# Patient Record
Sex: Male | Born: 1970 | Race: White | Hispanic: No | Marital: Married | Smoking: Current every day smoker
Health system: Southern US, Community
[De-identification: ages and names within clinical notes are randomized; demographics above are authoritative.]

## PROBLEM LIST (undated history)

## (undated) ENCOUNTER — Emergency Department (HOSPITAL_COMMUNITY): Disposition: A | Discharge status: Still In Hospital | Payer: Managed Care, Other (non HMO)

## (undated) DIAGNOSIS — R112 Nausea with vomiting, unspecified: Secondary | ICD-10-CM

## (undated) DIAGNOSIS — M519 Unspecified thoracic, thoracolumbar and lumbosacral intervertebral disc disorder: Secondary | ICD-10-CM

## (undated) DIAGNOSIS — K219 Gastro-esophageal reflux disease without esophagitis: Secondary | ICD-10-CM

## (undated) DIAGNOSIS — Z87442 Personal history of urinary calculi: Secondary | ICD-10-CM

## (undated) DIAGNOSIS — E119 Type 2 diabetes mellitus without complications: Secondary | ICD-10-CM

## (undated) DIAGNOSIS — E785 Hyperlipidemia, unspecified: Secondary | ICD-10-CM

## (undated) DIAGNOSIS — I10 Essential (primary) hypertension: Secondary | ICD-10-CM

## (undated) DIAGNOSIS — Z9889 Other specified postprocedural states: Secondary | ICD-10-CM

## (undated) DIAGNOSIS — G4730 Sleep apnea, unspecified: Secondary | ICD-10-CM

## (undated) HISTORY — PX: WISDOM TOOTH EXTRACTION: SHX21

## (undated) HISTORY — PX: ANKLE RECONSTRUCTION: SHX1151

## (undated) HISTORY — DX: Type 2 diabetes mellitus without complications: E11.9

## (undated) HISTORY — DX: Essential (primary) hypertension: I10

## (undated) HISTORY — PX: LUMBAR DISC SURGERY: SHX700

## (undated) HISTORY — DX: Hyperlipidemia, unspecified: E78.5

## (undated) HISTORY — DX: Unspecified thoracic, thoracolumbar and lumbosacral intervertebral disc disorder: M51.9

---

## 1998-10-07 HISTORY — PX: APPENDECTOMY: SHX54

## 1999-02-05 HISTORY — PX: COLON SURGERY: SHX602

## 2001-01-15 ENCOUNTER — Encounter: Payer: Self-pay | Admitting: Family Medicine

## 2001-01-15 ENCOUNTER — Inpatient Hospital Stay (HOSPITAL_COMMUNITY): Admission: RE | Admit: 2001-01-15 | Discharge: 2001-01-18 | Payer: Self-pay | Admitting: Family Medicine

## 2001-02-06 ENCOUNTER — Ambulatory Visit (HOSPITAL_COMMUNITY): Admission: RE | Admit: 2001-02-06 | Discharge: 2001-02-06 | Payer: Self-pay | Admitting: General Surgery

## 2002-06-07 ENCOUNTER — Emergency Department (HOSPITAL_COMMUNITY): Admission: EM | Admit: 2002-06-07 | Discharge: 2002-06-07 | Payer: Self-pay | Admitting: Emergency Medicine

## 2002-08-09 ENCOUNTER — Ambulatory Visit: Admission: RE | Admit: 2002-08-09 | Discharge: 2002-08-09 | Payer: Self-pay | Admitting: Family Medicine

## 2002-09-18 ENCOUNTER — Emergency Department (HOSPITAL_COMMUNITY): Admission: EM | Admit: 2002-09-18 | Discharge: 2002-09-18 | Payer: Self-pay | Admitting: Emergency Medicine

## 2002-12-09 ENCOUNTER — Encounter: Payer: Self-pay | Admitting: *Deleted

## 2002-12-09 ENCOUNTER — Emergency Department (HOSPITAL_COMMUNITY): Admission: EM | Admit: 2002-12-09 | Discharge: 2002-12-09 | Payer: Self-pay | Admitting: *Deleted

## 2003-11-29 ENCOUNTER — Emergency Department (HOSPITAL_COMMUNITY): Admission: EM | Admit: 2003-11-29 | Discharge: 2003-11-29 | Payer: Self-pay | Admitting: Emergency Medicine

## 2004-08-23 ENCOUNTER — Ambulatory Visit (HOSPITAL_BASED_OUTPATIENT_CLINIC_OR_DEPARTMENT_OTHER): Admission: RE | Admit: 2004-08-23 | Discharge: 2004-08-23 | Payer: Self-pay | Admitting: Orthopedic Surgery

## 2004-10-07 HISTORY — PX: ANKLE ARTHROSCOPY: SHX545

## 2006-01-30 ENCOUNTER — Emergency Department (HOSPITAL_COMMUNITY): Admission: EM | Admit: 2006-01-30 | Discharge: 2006-01-30 | Payer: Self-pay | Admitting: Emergency Medicine

## 2008-11-14 ENCOUNTER — Emergency Department (HOSPITAL_COMMUNITY): Admission: EM | Admit: 2008-11-14 | Discharge: 2008-11-14 | Payer: Self-pay | Admitting: Emergency Medicine

## 2009-10-07 HISTORY — PX: WISDOM TOOTH EXTRACTION: SHX21

## 2011-02-22 NOTE — Op Note (Signed)
NAME:  Mark Phillips, Mark Phillips               ACCOUNT NO.:  [id number]   MEDICAL RECORD NO.:  [id number]          PATIENT TYPE:  AMB   LOCATION:  DSC                          FACILITY:  [facility]   PHYSICIAN:  Loreta Ave, M.D. DATE OF BIRTH:  Sep 28, 1971   DATE OF PROCEDURE:  08/23/2004  DATE OF DISCHARGE:                                 OPERATIVE REPORT   PREOPERATIVE DIAGNOSES:  1.  Recurrent ankle sprains.  2.  Question lateral instability, right ankle.   POSTOPERATIVE DIAGNOSES:  1.  Recurrent mild ankle sprains without significant lateral or anterior      instability, right ankle.  2.  Evident high ankle sprain with partial tearing, syndesmosis.  3.  Reactive inner synovitis and superficial chondral injury, medial talar      dome.   PROCEDURE:  Right ankle examination under anesthesia, including fluoroscopic  partial synovectomy and debridement with scarification of syndesmosis  injury.   SURGEON:  Loreta Ave, M.D.   ASSISTANT:  Genene Churn. Denton Meek.   ANESTHESIA:  General.   ESTIMATED BLOOD LOSS:  Minimal.   TOURNIQUET TIME:  45 minutes.   SPECIMENS:  None.   CULTURES:  None.   COMPLICATIONS:  None.   DRESSING:  Soft compressive with Cam walker.   PROCEDURE:  Patient brought to the operating room and after adequate  anesthesia had been obtained, both ankles examined and fluoroscopic stress  views obtained on the right to talar tilt or anterior drawer stress views.  Talar tilt revealed that he only opened about 8 degrees.  The opposite left  ankle opening at the syndesmosis.  All bony structures intact.  Tourniquet  applied with a stirrup.  Prepped and draped in the usual sterile fashion.  Exsanguinated with elevation and an Esmarch, tourniquet inflated to 250  mmHg.  Anterolateral and anteromedial arthroscopic portals.  The ankle  entered and inspected.  Evident syndesmotic injury with tearing of the  anterior two-thirds syndesmosis extending up 5-6 mm.  All the  debris,  inflammatory tissue cleared.  That area was stressed with rotation and did  not open further.  The very back of the syndesmosis and a posterior distal  tib-fib ligament was still intact.  All inflammatory debris removed.  I was  comfortable enough with stress views that this did not require fixation at  the syndesmosis. Talar dome had a little bit of superficial injury,  posteromedial tear and tip of the medial malleolus treated with  chondroplasty.  Entire ankle examined but no other findings appreciated.  Anteriorly with full dorsiflexion.  Instruments and fluid removed.  Portals  and ankle injected with Marcaine.  Portals closed with 4-0 nylon.  Sterile  compressive dressing applied.  Cam walker applied.  Tourniquet deflated and  removed.  Anesthesia reversed.  Brought to the recovery room.  Tolerated the  surgery well with no complications.      Valentino Saxon   DFM/MEDQ  D:  08/23/2004  T:  08/24/2004  Job:  [id number]

## 2011-06-05 ENCOUNTER — Emergency Department (HOSPITAL_COMMUNITY)
Admission: EM | Admit: 2011-06-05 | Discharge: 2011-06-05 | Disposition: A | Discharge status: Home / Self Care | Payer: Managed Care, Other (non HMO) | Attending: Emergency Medicine | Admitting: Emergency Medicine

## 2011-06-05 ENCOUNTER — Emergency Department (HOSPITAL_COMMUNITY): Payer: Managed Care, Other (non HMO)

## 2011-06-05 DIAGNOSIS — F17200 Nicotine dependence, unspecified, uncomplicated: Secondary | ICD-10-CM | POA: Insufficient documentation

## 2011-06-05 DIAGNOSIS — IMO0002 Reserved for concepts with insufficient information to code with codable children: Secondary | ICD-10-CM | POA: Insufficient documentation

## 2011-06-05 DIAGNOSIS — R110 Nausea: Secondary | ICD-10-CM | POA: Insufficient documentation

## 2011-06-05 DIAGNOSIS — X58XXXA Exposure to other specified factors, initial encounter: Secondary | ICD-10-CM | POA: Insufficient documentation

## 2011-06-05 MED ORDER — METHOCARBAMOL 500 MG PO TABS: ORAL_TABLET | ORAL | Status: DC

## 2011-06-05 MED ORDER — DIAZEPAM 5 MG PO TABS
5.0000 mg | ORAL_TABLET | Freq: Once | ORAL | Status: AC
  Administered 2011-06-05: 5 mg via ORAL
  Filled 2011-06-05: qty 1

## 2011-06-05 MED ORDER — ONDANSETRON HCL 4 MG PO TABS
4.0000 mg | ORAL_TABLET | Freq: Once | ORAL | Status: AC
  Administered 2011-06-05: 4 mg via ORAL
  Filled 2011-06-05: qty 1

## 2011-06-05 MED ORDER — HYDROCODONE-ACETAMINOPHEN 5-325 MG PO TABS
2.0000 | ORAL_TABLET | Freq: Once | ORAL | Status: AC
  Administered 2011-06-05: 2 via ORAL
  Filled 2011-06-05: qty 2

## 2011-06-05 MED ORDER — HYDROCODONE-ACETAMINOPHEN 7.5-325 MG PO TABS: 1.0000 | ORAL_TABLET | ORAL | Status: AC | PRN

## 2011-06-05 NOTE — ED Provider Notes (Signed)
Medical screening examination/treatment/procedure(s) were performed by non-physician practitioner and as supervising physician I was immediately available for consultation/collaboration.  Shelda Jakes, MD 06/05/11 2007

## 2011-06-05 NOTE — ED Notes (Addendum)
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

## 2011-06-05 NOTE — ED Notes (Signed)
Pt c/o rt shoulder pain that started yesterday. Movement aggravates pain. Limited ROM d/t pain. Denies any injury.

## 2011-06-05 NOTE — ED Provider Notes (Signed)
History     CSN: [id number] Arrival date & time: 06/05/2011  6:01 PM  Chief Complaint  Patient presents with  . Shoulder Pain   Patient is a 40 y.o. male presenting with shoulder pain. The history is provided by the patient.  Shoulder Pain This is a new problem. The current episode started yesterday. The problem occurs constantly. The problem has been gradually worsening. Associated symptoms include arthralgias and nausea. Pertinent negatives include no abdominal pain, chest pain, coughing or neck pain. Exacerbated by: movement. He has tried NSAIDs for the symptoms. The treatment provided no relief.    History reviewed. No pertinent past medical history.  Past Surgical History  Procedure Date  . Colon surgery   . Ankle reconstruction     lt reconstruction and rt scope sx    No family history on file.  History  Substance Use Topics  . Smoking status: Current Everyday Smoker  . Smokeless tobacco: Not on file  . Alcohol Use: No      Review of Systems  Constitutional: Negative for activity change.       All ROS Neg except as noted in HPI  HENT: Negative for nosebleeds and neck pain.   Eyes: Negative for photophobia and discharge.  Respiratory: Negative for cough, shortness of breath and wheezing.   Cardiovascular: Negative for chest pain and palpitations.  Gastrointestinal: Positive for nausea. Negative for abdominal pain and blood in stool.  Genitourinary: Negative for dysuria, frequency and hematuria.  Musculoskeletal: Positive for arthralgias. Negative for back pain.  Skin: Negative.   Neurological: Negative for dizziness, seizures and speech difficulty.  Psychiatric/Behavioral: Negative for hallucinations and confusion.    Physical Exam  BP 149/90  Pulse 73  Temp(Src) 98.3 F (36.8 C) (Oral)  Resp 16  SpO2 96%  Physical Exam  Nursing note and vitals reviewed. Constitutional: He is oriented to person, place, and time. He appears well-developed and  well-nourished.  Non-toxic appearance.  HENT:  Head: Normocephalic.  Right Ear: Tympanic membrane and external ear normal.  Left Ear: Tympanic membrane and external ear normal.  Eyes: EOM and lids are normal. Pupils are equal, round, and reactive to light.  Neck: Normal range of motion. Neck supple. Carotid bruit is not present.  Cardiovascular: Normal rate, regular rhythm, normal heart sounds, intact distal pulses and normal pulses.   Pulmonary/Chest: Breath sounds normal. No respiratory distress.  Abdominal: Soft. Bowel sounds are normal. There is no tenderness. There is no guarding.  Musculoskeletal: He exhibits tenderness.       Pain of the posterior left shoulder with ROM. Minimal pain to palpation. No dislocation. Sensory wnl. Distal pulses and cap refill on the left wnl.  Lymphadenopathy:       Head (right side): No submandibular adenopathy present.       Head (left side): No submandibular adenopathy present.    He has no cervical adenopathy.  Neurological: He is alert and oriented to person, place, and time. He has normal strength. No cranial nerve deficit or sensory deficit.  Skin: Skin is warm and dry.  Psychiatric: He has a normal mood and affect. His speech is normal.    ED Course  Procedures  MDM I have reviewed nursing notes, vital signs, and all appropriate lab and imaging results for this patient.      Kathie Dike, [place] 06/05/11 1956

## 2011-06-12 ENCOUNTER — Encounter: Payer: Self-pay | Admitting: Orthopedic Surgery

## 2011-06-12 ENCOUNTER — Ambulatory Visit (INDEPENDENT_AMBULATORY_CARE_PROVIDER_SITE_OTHER): Payer: Managed Care, Other (non HMO) | Admitting: Orthopedic Surgery

## 2011-06-12 VITALS — Resp 16 | Ht 72.0 in | Wt 195.0 lb

## 2011-06-12 DIAGNOSIS — M5010 Cervical disc disorder with radiculopathy, unspecified cervical region: Secondary | ICD-10-CM | POA: Insufficient documentation

## 2011-06-12 DIAGNOSIS — M5090 Cervical disc disorder, unspecified, unspecified cervical region: Secondary | ICD-10-CM

## 2011-06-12 MED ORDER — PREDNISONE 10 MG PO KIT: 10.0000 mg | PACK | ORAL | Status: DC

## 2011-06-12 MED ORDER — HYDROCODONE-ACETAMINOPHEN 7.5-325 MG PO TABS: 1.0000 | ORAL_TABLET | ORAL | Status: AC | PRN

## 2011-06-12 MED ORDER — GABAPENTIN 100 MG PO CAPS: 100.0000 mg | ORAL_CAPSULE | Freq: Three times a day (TID) | ORAL | Status: DC

## 2011-06-12 MED ORDER — METHOCARBAMOL 500 MG PO TABS: 500.0000 mg | ORAL_TABLET | Freq: Three times a day (TID) | ORAL | Status: AC

## 2011-06-12 NOTE — Progress Notes (Signed)
Chief complaint: Pain in the cervical spine and radiating down the RIGHT arm HPI:(9) 40 year old male presented with pain in his cervical spine radiating down his RIGHT arm into his RIGHT hand which started last Tuesday.  Onset was sudden.  Pain started in the neck and shoulder blade request in a shoulder upper arm elbow and hand  The pain is described as stabbing and burning, constant with the intensity of 8/10.  He reports numbness and tingling as well.  Previous treatment hydrocodone and Robaxin  X-ray was taken at the hospital it shows a incomplete segmentation of C2 and 3 no acute fracture or listhesis.  ROS:(2) Snoring, nausea, numbness and tingling.  PFSH: (1) History reviewed. No pertinent past medical history.   Physical Exam(12) GENERAL: normal development   CDV: pulses are normal   Skin: normal  Lymph: nodes were not palpable/normal  Psychiatric: awake, alert and oriented  Neuro: normal sensation  MSK Cervical spine he does have pain with flexion of the cervical spine and he has tenderness at the base of the cervical spine there is also tenderness over the medialBorder of the scapula.  He has painful cervical range of motion and decreased range of motion  The RIGHT upper extremity is without tenderness along the shoulder elbow wrist or hand with normal range of motion strength and stability.   1 Normal reflexes both arms to plus elbow and wrist.  Imaging: Cervical spine x-ray as stated  Assessment: Most likely acute cervical disc with nerve root irritation    Plan: Recommend the following medications Norco 7.5 mg q.4 p.r.n. Pain, her Robaxin 500 mg q.8.  Neurontin 100 mg t.i.d.  Probe a Dosepak double strength.  Come back 2 weeks.

## 2011-06-12 NOTE — Patient Instructions (Signed)
Use heating pad as needed on the cervical spine for symptomatic relief  Take medications as ordered  Return 2 weeks  No Work 2 weeks  Start physical therapy

## 2011-06-18 ENCOUNTER — Telehealth: Payer: Self-pay | Admitting: Orthopedic Surgery

## 2011-06-18 NOTE — Telephone Encounter (Signed)
Called back to patient.

## 2011-06-18 NOTE — Telephone Encounter (Signed)
This is fine 

## 2011-06-18 NOTE — Telephone Encounter (Signed)
Patient states he has scheduled initial physical therapy appointment at [facility] 06/24/11, and was told that was first available appointment. His fol/up appointment here is 06/27/11.  He's wanting to make sure this is okay, or if he should check e [facility] for an earlier date (he lives in [place]).  His ph# is [phone].

## 2011-06-24 ENCOUNTER — Ambulatory Visit (HOSPITAL_COMMUNITY)
Admission: RE | Admit: 2011-06-24 | Discharge: 2011-06-24 | Disposition: A | Discharge status: Home / Self Care | Payer: Managed Care, Other (non HMO) | Source: Ambulatory Visit | Attending: Orthopedic Surgery | Admitting: Orthopedic Surgery

## 2011-06-24 DIAGNOSIS — IMO0001 Reserved for inherently not codable concepts without codable children: Secondary | ICD-10-CM | POA: Insufficient documentation

## 2011-06-24 DIAGNOSIS — M542 Cervicalgia: Secondary | ICD-10-CM | POA: Insufficient documentation

## 2011-06-24 DIAGNOSIS — M25519 Pain in unspecified shoulder: Secondary | ICD-10-CM | POA: Insufficient documentation

## 2011-06-24 NOTE — Patient Instructions (Addendum)
hep

## 2011-06-24 NOTE — Progress Notes (Signed)
Physical Therapy Evaluation  Patient Details  Name: Mark Phillips MRN: [id number] Date of Birth: 02-09-1971  Today's Date: 06/24/2011 Time: 1030-1110 Time Calculation (min): 40 min Visit#: 1 of 12 Re-eval: 07/24/11    Past Medical History: No past medical history on file. Past Surgical History:  Past Surgical History  Procedure Date  . Colon surgery   . Ankle reconstruction     lt reconstruction and rt scope sx    Subjective Symptoms/Limitations Symptoms: Pt states that he woke up and had a pain in his shoulder blade.  He went to work that night and the next day he could barely get our of bed.  He went to the ER on the 29th.  He states he started having pain down the back of his arm and tingling going down into his hand.  The patient states that the medication has helped and in the past three days he has not had the tingling.     How long can you sit comfortably?: no problem. How long can you stand comfortably?: no problem How long can you walk comfortably?: no problem. Special Tests: Pt is currently out of work and has been since the 29th. 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  Assessment Cervical AROM Cervical Flexion:  (wnl with increased pain with reps but no worsening) Cervical  Extension:  (wnl) Cervical - Right Side Bend:  (wnl but increased pain with activity but does not worsen sx) Cervical - Left Side Bend:  (wnl no c/o of pain) Cervical - Right Rotation: wnl Cervical - Left Rotation: wnl Cervical Strength Cervical Flexion: 5/5 Cervical Extension: 5/5 Cervical - Right Side Bend: 4/5 Cervical - Left Side Bend: 5/5  Mobility (including Balance)    Posture/Postural Control Posture/Postural Control:  (slight forward head; slight increased kyphosis)  Exercise/Treatments Stretches Shoulder Rolls:  (shoulder shrugs x10) Neck Exercises Neck Retraction: 10 reps Neck Lateral Flexion - Right: Strengthening;10 reps;Seated Scapular Retraction: 10 reps Additional Neck Exercises    Modalities Modalities: Traction Manual Therapy Manual Therapy: Myofascial release Myofascial Release: massage R lower, mid and upper trap. Traction Type of Traction: Cervical Min (lbs): manual tractionx 10 min.  Pt c/o of increased L cervical pain  Physical Therapy Assessment and Plan PT Assessment and Plan Clinical Impression Statement: mm spasm noted with massage along R mid trap area.  Obliterated with treatment. Rehab Potential: Good PT Frequency: Min 3X/week PT Duration: 4 weeks PT Plan: see for postural and body mechanics.  Begin T-band postural exercises/UBE backward, chest stretch and wall push up next treatment then progress to prone.    Goals PT Short Term Goals Time to Complete Goals: 2 weeks PT Short Term Goal 1: I HEP PT Short Term Goal 2: no radicular pain PT Long Term Goals PT Long Term Goal 1: strength wnl-4 wk PT Long Term Goal 2: RTW-4 wk Long Term Goal 3: pain no greater than a  1 in the past week wtih no radicular symptoms  Problem List Patient Active Problem List  Diagnoses  . Cervical disc disorder with radiculopathy  . Cervicalgia    PT - End of Session Activity Tolerance: Patient tolerated treatment well General Behavior During Session:  [facility] for tasks performed Cognition: [facility] for tasks performed   RUSSELL,CINDY 06/24/2011, 12:10 PM  Physician Documentation Your signature is required to indicate approval of the treatment plan as stated above.  Please sign and either send electronically or make a copy of this report for your files and return this physician signed original.   Please mark one 1.__approve of plan  2. ___approve of plan with the following conditions.   ______________________________                                                          _____________________ Physician Signature                                                                                                             Date

## 2011-06-26 ENCOUNTER — Ambulatory Visit (HOSPITAL_COMMUNITY)
Admission: RE | Admit: 2011-06-26 | Discharge: 2011-06-26 | Disposition: A | Discharge status: Home / Self Care | Payer: Managed Care, Other (non HMO) | Source: Ambulatory Visit | Attending: Physical Therapy | Admitting: Physical Therapy

## 2011-06-26 DIAGNOSIS — M542 Cervicalgia: Secondary | ICD-10-CM

## 2011-06-26 NOTE — Progress Notes (Signed)
Physical Therapy Treatment Patient Details  Name: Mark Phillips MRN: [id number] Date of Birth: 1970-12-11  Today's Date: 06/26/2011 Time: 4098-1191 Time Calculation (min): 39 min Visit#: 2  of    Re-eval: 07/24/11    Subjective: Symptoms/Limitations Symptoms: Pt states that he was sore after last treatment but feels better today. Pain Assessment Pain Score:   2 Pain Location: Neck Pain Orientation: Right  Precautions/Restrictions     Mobility (including Balance)       Exercise/Treatments Stretches Chest Stretch: 5 reps (x2) Shoulder Rolls:  (shoulder shrugs x10) Neck Exercises Neck Retraction: 10 reps Neck Lateral Flexion - Right: Strengthening;10 reps;Seated Shoulder Extension: Strengthening;10 reps;Standing;Theraband Theraband Level (Shoulder Extension): Level 3 (Green) Row: Strengthening;10 reps;Standing;Theraband Theraband Level (Row): Level 3 (Green) Scapular Retraction: Strengthening;10 reps;Standing;Theraband Theraband Level (Scapular Retraction): Level 3 (Green) Additional Neck Exercises Wall Pushups/Modified Pushups:  (10x) UBE (Upper Arm Bike):  (4'@ 1.0)  Manual Therapy Myofascial Release: Massage R mid/and upper trap with mm spasm noted and obliterated;  Tight scalene mm  Physical Therapy Assessment and Plan PT Assessment and Plan Clinical Impression Statement: Pt needed verbal cuing for good form PT Plan: continue to see  begin prone chin tuck head raise  w -back next rx    Goals    Problem List Patient Active Problem List  Diagnoses  . Cervical disc disorder with radiculopathy  . Cervicalgia    General Behavior During Session: [facility] for tasks performed Cognition: [facility] for tasks performed  RUSSELL,CINDY 06/26/2011, 10:39 AM

## 2011-06-27 ENCOUNTER — Encounter: Payer: Self-pay | Admitting: Orthopedic Surgery

## 2011-06-27 ENCOUNTER — Ambulatory Visit (INDEPENDENT_AMBULATORY_CARE_PROVIDER_SITE_OTHER): Payer: Managed Care, Other (non HMO) | Admitting: Orthopedic Surgery

## 2011-06-27 VITALS — Ht 72.0 in | Wt 195.0 lb

## 2011-06-27 DIAGNOSIS — M542 Cervicalgia: Secondary | ICD-10-CM

## 2011-06-27 MED ORDER — METHOCARBAMOL 500 MG PO TABS: ORAL_TABLET | ORAL | Status: DC

## 2011-06-27 MED ORDER — HYDROCODONE-ACETAMINOPHEN 7.5-325 MG PO TABS: 1.0000 | ORAL_TABLET | ORAL | Status: DC | PRN

## 2011-06-27 NOTE — Progress Notes (Signed)
Assessment: Most likely acute cervical disc with nerve root irritation  Plan: Recommend the following medications Norco 7.5 mg q.4 p.r.n. Pain, her Robaxin 500 mg q.8. Neurontin 100 mg t.i.d. Probe a Dosepak double strength. Come back 2 weeks.   Followup visit as noted above  Patient has improved  Patient like to return to work  Just sore at this time  Exam no tenderness in the neck or shoulder.  Full range of motion of both shoulders.  Normal grip strength in both upper extremities.  Normal range of motion of the cervical spine.  Impression cervical pain possible radiculopathy from root irritation  Resolved  Plan continue medications as needed return to work on Tuesday

## 2011-06-27 NOTE — Patient Instructions (Signed)
Return to work on Tues 25 th of Sept

## 2011-06-28 ENCOUNTER — Ambulatory Visit (HOSPITAL_COMMUNITY)
Admission: RE | Admit: 2011-06-28 | Discharge: 2011-06-28 | Disposition: A | Discharge status: Home / Self Care | Payer: Managed Care, Other (non HMO) | Source: Ambulatory Visit | Attending: Physical Therapy | Admitting: Physical Therapy

## 2011-06-28 DIAGNOSIS — M542 Cervicalgia: Secondary | ICD-10-CM

## 2011-06-28 NOTE — Patient Instructions (Signed)
T-band exercises for postural correction.

## 2011-06-28 NOTE — Progress Notes (Signed)
Physical Therapy Treatment Patient Details  Name: Mark Phillips MRN: [id number] Date of Birth: May 19, 1971  Today's Date: 06/28/2011 Time: 4098-1191 Time Calculation (min): 38 min Visit#: 3  of 12   Re-eval: 07/24/11   Charge:  There ex ',massage 16' Subjective: Symptoms/Limitations Symptoms: I'm doing well Pain Assessment Currently in Pain?: No/denies Pain Location: Neck Pain Orientation: Right Pain Type: Acute pain         Exercise/TreatmentsC Stretches Chest Stretch: 5 reps (x2) Neck Exercises Shoulder Extension: Strengthening;Standing;Theraband Theraband Level (Shoulder Extension): Level 3 (Green) Row: Strengthening;15 reps;Standing;Theraband Theraband Level (Row): Level 3 (Green) Scapular Retraction: Strengthening;15 reps;Standing;Theraband Theraband Level (Scapular Retraction): Level 3 (Green) W Back: Seated;10 reps;Weight W Back Weights (lbs): 2 Additional Neck Exercises Wall Pushups/Modified Pushups:  (15) Cybex Row:  (prone shoulder extension with 2# x 15) Chest Press:  (prone chin tuck head lift/prone shoulder extension) UBE (Upper Arm Bike): 1.5@ 4'  Manual Therapy Myofascial Release: massage to lower,mid and upper right trap.  No mm spasm today only tightness felt.  Physical Therapy Assessment and Plan PT Assessment and Plan Clinical Impression Statement: Pt completing exercises with better form. Clinical Impairments Affecting Rehab Potential: pain, mm tightness PT Plan:  begin seated x to V; prone rows,prone w-back- pt given T-band to complete T-band exercises at home.    Goals  return to work duties with not pain  Problem List Patient Active Problem List  Diagnoses  . Cervical disc disorder with radiculopathy  . Cervicalgia    PT - End of Session Activity Tolerance: Patient tolerated treatment well General Behavior During Session: [facility] for tasks performed Cognition: [facility] for tasks performed  Mirtha Jain,CINDY 06/28/2011, 4:52 PM

## 2011-07-01 ENCOUNTER — Ambulatory Visit (HOSPITAL_COMMUNITY): Payer: Managed Care, Other (non HMO) | Admitting: *Deleted

## 2011-07-03 ENCOUNTER — Ambulatory Visit (HOSPITAL_COMMUNITY)
Admission: RE | Admit: 2011-07-03 | Discharge: 2011-07-03 | Disposition: A | Discharge status: Home / Self Care | Payer: Managed Care, Other (non HMO) | Source: Ambulatory Visit | Attending: *Deleted | Admitting: *Deleted

## 2011-07-03 DIAGNOSIS — M542 Cervicalgia: Secondary | ICD-10-CM

## 2011-07-03 NOTE — Progress Notes (Signed)
Physical Therapy Treatment Patient Details  Name: Mark Phillips MRN: [id number] Date of Birth: 1971/09/02  Today's Date: 07/03/2011 Time: 2841-3244 Time Calculation (min): 35 min Visit#: 4  of 12   Re-eval: 07/24/11  Charge:  therex 21 min Manual 8 min  Subjective: Symptoms/Limitations Symptoms: Doing fine today, 2nd day return to work (3rd shift).  Pain stated pain scale 3/10 in shoulder joint, increases to 5-6/10 during work. Pain Assessment Currently in Pain?: Yes Pain Score:   3 Pain Location: Neck   Exercise/Treatments Therex completed by Becky Sax, PTA Manual by Seth Bake, PTA. 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  Able to reduce spasms.  Physical Therapy Assessment and Plan PT Assessment and Plan Clinical Impression Statement: Added prone postural strengthening therex, pt completed all activities with good form, tech.  Increased spasms upper, mid and lower traps, able to partially reduce spasms. PT Plan: Continue with current POC.    Goals    Problem List Patient Active Problem List  Diagnoses  . Cervical disc disorder with radiculopathy  . Cervicalgia    PT - End of Session Activity Tolerance: Patient tolerated treatment well General Behavior During Session: [facility] for tasks performed Cognition: [facility] for tasks performed  Juel Burrow 07/03/2011, 11:43 AM

## 2011-07-04 ENCOUNTER — Inpatient Hospital Stay (HOSPITAL_COMMUNITY)
Admission: RE | Admit: 2011-07-04 | Payer: Managed Care, Other (non HMO) | Source: Ambulatory Visit | Admitting: *Deleted

## 2011-07-04 ENCOUNTER — Telehealth (HOSPITAL_COMMUNITY): Payer: Self-pay | Admitting: *Deleted

## 2011-07-05 ENCOUNTER — Ambulatory Visit (HOSPITAL_COMMUNITY): Payer: Managed Care, Other (non HMO) | Admitting: Physical Therapy

## 2011-07-05 ENCOUNTER — Telehealth (HOSPITAL_COMMUNITY): Payer: Self-pay | Admitting: Physical Therapy

## 2011-07-08 ENCOUNTER — Ambulatory Visit (HOSPITAL_COMMUNITY): Payer: Managed Care, Other (non HMO) | Admitting: Physical Therapy

## 2011-07-10 ENCOUNTER — Ambulatory Visit (HOSPITAL_COMMUNITY)
Admission: RE | Admit: 2011-07-10 | Discharge: 2011-07-10 | Disposition: A | Discharge status: Home / Self Care | Payer: Managed Care, Other (non HMO) | Source: Ambulatory Visit | Attending: Orthopedic Surgery | Admitting: Orthopedic Surgery

## 2011-07-10 DIAGNOSIS — M25519 Pain in unspecified shoulder: Secondary | ICD-10-CM | POA: Insufficient documentation

## 2011-07-10 DIAGNOSIS — IMO0001 Reserved for inherently not codable concepts without codable children: Secondary | ICD-10-CM | POA: Insufficient documentation

## 2011-07-10 DIAGNOSIS — M542 Cervicalgia: Secondary | ICD-10-CM | POA: Insufficient documentation

## 2011-07-10 NOTE — Progress Notes (Signed)
Physical Therapy Treatment Patient Details  Name: DAVISON OHMS MRN: [id number] Date of Birth: 09/17/1971  Today's Date: 07/10/2011 Time: 6962-9528 Time Calculation (min): 27 min Visit#: 5  of 12   Re-eval: 07/24/11 Charges: Therex x 23'  Subjective: Symptoms/Limitations Symptoms: I'm hurting more in my left shoulder joint now that I am up in my neck. Pain Assessment Currently in Pain?: Yes Pain Score:   3 Pain Location: Shoulder Pain Orientation: Left  Objective: Pt 10' late for appointment.  Exercise/Treatments Teacher, music: 3 reps;30 seconds Chest Stretch: 5 reps;20 seconds Neck Exercises Neck Retraction: 15 reps Shoulder Extension: Prone;15 reps;Limitations Shoulder Extension Limitations: 2# Row: Prone;15 reps;Limitations Row Limitations: 2# W Back: Seated;15 reps;Prone;Weight W Back Weights (lbs): 2# X to V: 15 reps;Weight X to V Weights (lbs): 2# Additional Neck Exercises Wall Pushups/Modified Pushups: 15 Cybex Row: 2 PL 2x10 reps Chest Press: 2 PL 2x10 reps UBE (Upper Arm Bike): 4'@2 .5  Physical Therapy Assessment and Plan PT Assessment and Plan Clinical Impression Statement: Pt completes therex without difficulty. Pt with pain decrease to 2/10 at end of session. PT Treatment/Interventions: Therapeutic exercise PT Plan: Continue to progress per PT POC. Assess pain next tx.     Problem List Patient Active Problem List  Diagnoses  . Cervical disc disorder with radiculopathy  . Cervicalgia    PT - End of Session Activity Tolerance: Patient tolerated treatment well General Behavior During Session: [facility] for tasks performed Cognition: [facility] for tasks performed  Antonieta Iba 07/10/2011, 10:28 AM

## 2011-07-12 ENCOUNTER — Inpatient Hospital Stay (HOSPITAL_COMMUNITY)
Admission: RE | Admit: 2011-07-12 | Payer: Managed Care, Other (non HMO) | Source: Ambulatory Visit | Admitting: Physical Therapy

## 2012-08-08 ENCOUNTER — Emergency Department (HOSPITAL_COMMUNITY)
Admission: EM | Admit: 2012-08-08 | Discharge: 2012-08-08 | Disposition: A | Discharge status: Home / Self Care | Payer: Managed Care, Other (non HMO) | Attending: Emergency Medicine | Admitting: Emergency Medicine

## 2012-08-08 ENCOUNTER — Emergency Department (HOSPITAL_COMMUNITY): Payer: Managed Care, Other (non HMO)

## 2012-08-08 ENCOUNTER — Encounter (HOSPITAL_COMMUNITY): Payer: Self-pay | Admitting: *Deleted

## 2012-08-08 DIAGNOSIS — I10 Essential (primary) hypertension: Secondary | ICD-10-CM | POA: Insufficient documentation

## 2012-08-08 DIAGNOSIS — R112 Nausea with vomiting, unspecified: Secondary | ICD-10-CM | POA: Insufficient documentation

## 2012-08-08 DIAGNOSIS — R079 Chest pain, unspecified: Secondary | ICD-10-CM | POA: Insufficient documentation

## 2012-08-08 DIAGNOSIS — R059 Cough, unspecified: Secondary | ICD-10-CM | POA: Insufficient documentation

## 2012-08-08 DIAGNOSIS — E7800 Pure hypercholesterolemia, unspecified: Secondary | ICD-10-CM | POA: Insufficient documentation

## 2012-08-08 DIAGNOSIS — Z79899 Other long term (current) drug therapy: Secondary | ICD-10-CM | POA: Insufficient documentation

## 2012-08-08 DIAGNOSIS — R05 Cough: Secondary | ICD-10-CM | POA: Insufficient documentation

## 2012-08-08 DIAGNOSIS — F17200 Nicotine dependence, unspecified, uncomplicated: Secondary | ICD-10-CM | POA: Insufficient documentation

## 2012-08-08 LAB — CBC WITH DIFFERENTIAL/PLATELET
Basophils Absolute: 0.1 10*3/uL (ref 0.0–0.1)
Basophils Relative: 0 % (ref 0–1)
MCHC: 35.5 g/dL (ref 30.0–36.0)
Monocytes Absolute: 1.2 10*3/uL — ABNORMAL HIGH (ref 0.1–1.0)
Neutro Abs: 7.5 10*3/uL (ref 1.7–7.7)
Neutrophils Relative %: 58 % (ref 43–77)
Platelets: 188 10*3/uL (ref 150–400)
RDW: 12.8 % (ref 11.5–15.5)

## 2012-08-08 LAB — TROPONIN I: Troponin I: <0.3 ng/mL (ref <0.30)

## 2012-08-08 LAB — BASIC METABOLIC PANEL
Chloride: 102 mEq/L (ref 96–112)
Creatinine, Ser: 0.7 mg/dL (ref 0.50–1.35)
GFR calc Af Amer: >90 mL/min (ref >90)

## 2012-08-08 MED ORDER — SODIUM CHLORIDE 0.9 % IV SOLN
INTRAVENOUS | Status: DC
  Administered 2012-08-08: 19:00:00 via INTRAVENOUS

## 2012-08-08 NOTE — ED Notes (Signed)
Pt c/o intermittent chest pain x2 weeks. Pt states pain will last 3-4 hours at a time and describes pain as dull. Pt also c/o nausea and states his only episode of vomiting was this morning. Pt has taken Tums for relief but no success.

## 2012-08-08 NOTE — ED Provider Notes (Signed)
History   This chart was scribed for [employer]. Rubin Payor, MD by Toya Smothers. The patient was seen in room APA08/APA08. Patient's care was started at 1656.  CSN: [id number]  Arrival date & time 08/08/12  1656   First MD Initiated Contact with Patient 08/08/12 1742      Chief Complaint  Patient presents with  . Chest Pain   The history is provided by the patient. No language interpreter was used.    Mark Phillips is a 41 y.o. male who presents to the Emergency Department complaining of 2 weeks of gradual onset, intermittent, waxing and waning, moderate chest pain at rest, with associated mild cough producing yellow sputum. Pain is described as dull and dull, centralized, non-radiating, and unlike any before. Episodes may last up to 3 days and is neither aggravated or alleviated by anything. Today, Pt reports gradually worsening pain, nausea, and vomiting. There has been no improvement despite use of Ibuprofen 200mg . No SOB, leg swelling, palpations, abdominal pain, or diaphoresis. Medical Hx includes HTN and high cholesterol. Family Hx includes maternal and paternal MI and heart failure. Pt denotes having stress test in 2009. He is a current everyday smoker and denies the consumption of alcohol.   Past Medical History  Diagnosis Date  . Hypertension   . High cholesterol    Past Surgical History  Procedure Date  . Colon surgery   . Ankle reconstruction     lt reconstruction and rt scope sx   Family History  Problem Relation Age of Onset  . Heart disease    . Arthritis    . Lung disease    . Cancer    . Asthma    . Diabetes     History  Substance Use Topics  . Smoking status: Current Every Day Smoker  . Smokeless tobacco: Not on file  . Alcohol Use: No    Review of Systems  Respiratory: Positive for cough. Negative for shortness of breath.   Cardiovascular: Positive for chest pain. Negative for leg swelling.  Gastrointestinal: Positive for nausea and vomiting. Negative  for abdominal pain.  All other systems reviewed and are negative.   Allergies  Albuterol  Home Medications   Current Outpatient Rx  Name Route Sig Dispense Refill  . IBUPROFEN 200 MG PO TABS Oral Take 600 mg by mouth every 8 (eight) hours as needed. For pain    . GABAPENTIN 100 MG PO CAPS Oral Take 1 capsule (100 mg total) by mouth 3 (three) times daily. 42 capsule 0   BP 154/88  Pulse 75  Temp 98.7 F (37.1 C) (Oral)  Resp 23  SpO2 99%  Physical Exam  Constitutional: He is oriented to person, place, and time. He appears well-developed and well-nourished. No distress.  HENT:  Head: Normocephalic and atraumatic.  Mouth/Throat: No oropharyngeal exudate.  Neck: Normal range of motion. No tracheal deviation present.  Cardiovascular: Normal rate and normal heart sounds.   No murmur heard. Pulmonary/Chest: Effort normal. He exhibits no tenderness.       Rare wheezes to L side.  Abdominal: Soft. There is no tenderness.  Musculoskeletal: Normal range of motion. He exhibits no tenderness.       No swelling to lower legs.  Neurological: He is alert and oriented to person, place, and time. Coordination normal.  Skin: Skin is warm and dry. No rash noted. He is not diaphoretic.    ED Course  Procedures COORDINATION OF CARE: 17:03- Ordered EKG 12-Lead Once. 17:18-  Ordered Cardiac monitoring and ED EKG. 17:46- Evaluated Pt. Pt is awake, alert, and without distress. 17:53- Patient informed of clinical course, understand medical decision-making process, and agree with plan. 17:59- Ordered DG Chest 2 View 1 time imaging, CBC with Differential, and Basic metabolic panel.  20:15- Rechecked Pt. Pt stable in ED with no significant deterioration in condition. 21:45- Pt feels improved after observation and/or treatment in ED. Will prepare for discharge.  Results for orders placed during the hospital encounter of 08/08/12  CBC WITH DIFFERENTIAL      Component Value Range   WBC 13.0 (*) 4.0  - 10.5 K/uL   RBC 5.25  4.22 - 5.81 MIL/uL   Hemoglobin 16.2  13.0 - 17.0 g/dL   HCT 96.0  45.4 - 09.8 %   MCV 86.9  78.0 - 100.0 fL   MCH 30.9  26.0 - 34.0 pg   MCHC 35.5  30.0 - 36.0 g/dL   RDW 11.9  14.7 - 82.9 %   Platelets 188  150 - 400 K/uL   Neutrophils Relative 58  43 - 77 %   Neutro Abs 7.5  1.7 - 7.7 K/uL   Lymphocytes Relative 28  12 - 46 %   Lymphs Abs 3.6  0.7 - 4.0 K/uL   Monocytes Relative 9  3 - 12 %   Monocytes Absolute 1.2 (*) 0.1 - 1.0 K/uL   Eosinophils Relative 5  0 - 5 %   Eosinophils Absolute 0.7  0.0 - 0.7 K/uL   Basophils Relative 0  0 - 1 %   Basophils Absolute 0.1  0.0 - 0.1 K/uL  BASIC METABOLIC PANEL      Component Value Range   Sodium 137  135 - 145 mEq/L   Potassium 3.8  3.5 - 5.1 mEq/L   Chloride 102  96 - 112 mEq/L   CO2 24  19 - 32 mEq/L   Glucose, Bld 130 (*) 70 - 99 mg/dL   BUN 9  6 - 23 mg/dL   Creatinine, Ser 5.62  0.50 - 1.35 mg/dL   Calcium 9.7  8.4 - 13.0 mg/dL   GFR calc non Af Amer >90  >90 mL/min   GFR calc Af Amer >90  >90 mL/min  TROPONIN I      Component Value Range   Troponin I <0.30  <0.30 ng/mL   Dg Chest 2 View  08/08/2012  *RADIOLOGY REPORT*  Clinical Data: Chest pain.  CHEST - 2 VIEW  Comparison: 11/29/2003  Findings: Cardiomegaly.  Lungs are clear.  No effusions or edema. No acute bony abnormality.  IMPRESSION: Cardiomegaly.  No acute findings.   Original Report Authenticated By: Charlett Nose, M.D.      Da andte: 08/08/2012  Rate: 73  Rhythm: normal sinus rhythm  QRS Axis: normal  Intervals: normal  ST/T Wave abnormalities: nonspecific ST/T changes  Conduction Disutrbances:none  Narrative Interpretation:   Old EKG Reviewed: none available    MDM  patietn with chest pain. EKG reassuring. Enzymes are negative x2. Previous negative stress test. Doubt cardiac cause at this time. His been going on for last few weeks. He'll followup with his primary care Dr. for further evaluation.    I personally performed the  services described in this documentation, which was scribed in my presence. The recorded information has been reviewed and considered.  }     Juliet Rude. Rubin Payor, MD 08/08/12 2158

## 2012-08-08 NOTE — ED Notes (Signed)
Pt c/o chest pain located in middle of chest, pain does radiate to left side of chest at times and other times radiates to right shoulder, pain is associated with nausea and vomiting. Pain has been sharp at times, dull at others, has come and went for the past few weeks,

## 2012-08-24 ENCOUNTER — Encounter (HOSPITAL_COMMUNITY): Payer: Self-pay | Admitting: *Deleted

## 2012-08-24 ENCOUNTER — Emergency Department (HOSPITAL_COMMUNITY)
Admission: EM | Admit: 2012-08-24 | Discharge: 2012-08-24 | Disposition: A | Discharge status: Home / Self Care | Payer: Managed Care, Other (non HMO) | Attending: Emergency Medicine | Admitting: Emergency Medicine

## 2012-08-24 ENCOUNTER — Emergency Department (HOSPITAL_COMMUNITY): Payer: Managed Care, Other (non HMO)

## 2012-08-24 DIAGNOSIS — M25579 Pain in unspecified ankle and joints of unspecified foot: Secondary | ICD-10-CM | POA: Insufficient documentation

## 2012-08-24 DIAGNOSIS — I10 Essential (primary) hypertension: Secondary | ICD-10-CM | POA: Insufficient documentation

## 2012-08-24 DIAGNOSIS — E7800 Pure hypercholesterolemia, unspecified: Secondary | ICD-10-CM | POA: Insufficient documentation

## 2012-08-24 DIAGNOSIS — F17200 Nicotine dependence, unspecified, uncomplicated: Secondary | ICD-10-CM | POA: Insufficient documentation

## 2012-08-24 MED ORDER — HYDROMORPHONE HCL PF 1 MG/ML IJ SOLN
1.0000 mg | Freq: Once | INTRAMUSCULAR | Status: AC
  Administered 2012-08-24: 1 mg via INTRAMUSCULAR
  Filled 2012-08-24: qty 1

## 2012-08-24 MED ORDER — OXYCODONE-ACETAMINOPHEN 5-325 MG PO TABS: 1.0000 | ORAL_TABLET | Freq: Four times a day (QID) | ORAL | Status: AC | PRN

## 2012-08-24 MED ORDER — OXYCODONE-ACETAMINOPHEN 5-325 MG PO TABS: 1.0000 | ORAL_TABLET | Freq: Four times a day (QID) | ORAL | Status: DC | PRN

## 2012-08-24 NOTE — ED Notes (Signed)
Pt dispensed percocet as per script from EDP.

## 2012-08-24 NOTE — ED Provider Notes (Signed)
History    This chart was scribed for Mark Lennert, MD, MD by Smitty Pluck, ED Scribe. The patient was seen in room APA10 and the patient's care was started at 8:38PM.   CSN: [id number]  Arrival date & time 08/24/12  2015      Chief Complaint  Patient presents with  . Ankle Pain    radiates all way to left knee    (Consider location/radiation/quality/duration/timing/severity/associated sxs/prior treatment) Patient is a 41 y.o. male presenting with ankle pain. The history is provided by the patient. No language interpreter was used.  Ankle Pain  The incident occurred 12 to 24 hours ago. The incident occurred at home. There was no injury mechanism. The pain is present in the left ankle. The pain is moderate. The pain has been constant since onset.   Mark Phillips is a 41 y.o. male who presents to the Emergency Department complaining of constant, moderate left ankle pain onset today. He denies recent injury to left ankle. He noticed swelling of left ankle. Pt used icepack without relief. Pt reports having reconstructive surgery on left ankle in 1996 by Dr. Romeo Apple. Pt denies any other pain.   Past Medical History  Diagnosis Date  . Hypertension   . High cholesterol     Past Surgical History  Procedure Date  . Colon surgery   . Ankle reconstruction     lt reconstruction and rt scope sx    Family History  Problem Relation Age of Onset  . Heart disease    . Arthritis    . Lung disease    . Cancer    . Asthma    . Diabetes      History  Substance Use Topics  . Smoking status: Current Every Day Smoker -- 1.0 packs/day    Types: Cigarettes  . Smokeless tobacco: Not on file  . Alcohol Use: No      Review of Systems  Constitutional: Negative for fatigue.  HENT: Negative for congestion, sinus pressure and ear discharge.   Eyes: Negative for discharge.  Respiratory: Negative for cough.   Cardiovascular: Negative for chest pain.  Gastrointestinal: Negative for  abdominal pain and diarrhea.  Genitourinary: Negative for frequency and hematuria.  Musculoskeletal: Negative for back pain.  Skin: Negative for rash.  Neurological: Negative for seizures and headaches.  Hematological: Negative.   Psychiatric/Behavioral: Negative for hallucinations.  All other systems reviewed and are negative.    Allergies  Albuterol  Home Medications   Current Outpatient Rx  Name  Route  Sig  Dispense  Refill  . GABAPENTIN 100 MG PO CAPS   Oral   Take 1 capsule (100 mg total) by mouth 3 (three) times daily.   42 capsule   0   . IBUPROFEN 200 MG PO TABS   Oral   Take 600 mg by mouth every 8 (eight) hours as needed. For pain           BP 146/86  Pulse 88  Temp 97.7 F (36.5 C) (Oral)  Resp 20  Ht 6' (1.829 m)  Wt 195 lb (88.451 kg)  BMI 26.45 kg/m2  SpO2 98%  Physical Exam  Nursing note and vitals reviewed. Constitutional: He is oriented to person, place, and time. He appears well-developed.  HENT:  Head: Normocephalic.  Eyes: Conjunctivae normal are normal.  Neck: No tracheal deviation present.  Cardiovascular:  No murmur heard. Musculoskeletal: Normal range of motion.       Tenderness and swelling to  lateral left ankle.   Neurological: He is oriented to person, place, and time.  Skin: Skin is warm.  Psychiatric: He has a normal mood and affect.    ED Course  Procedures (including critical care time) DIAGNOSTIC STUDIES: Oxygen Saturation is 98% on room air, normal by my interpretation.    COORDINATION OF CARE: 8:42 PM Discussed ED treatment with pt  8:44 PM Ordered:     . [COMPLETED]  HYDROmorphone (DILAUDID) injection  1 mg Intramuscular Once       Labs Reviewed - No data to display Dg Ankle Complete Left  08/24/2012  *RADIOLOGY REPORT*  Clinical Data: Left ankle pain and swelling.  LEFT ANKLE COMPLETE - 3+ VIEW  Comparison: None  Findings: The ankle mortise is maintained.  No acute ankle fracture.  There are multiple  small screw fragments in the fibula, talus and calcaneus along with remote screw holes in the fibula. Mild tibiotalar joint degenerative changes.  No osteochondral lesion.  The visualized mid and hind foot bony structures are intact.  IMPRESSION: Remote post-traumatic and postsurgical changes.  No acute bony findings.   Original Report Authenticated By: Rudie Meyer, M.D.      No diagnosis found.    MDM        The chart was scribed for me under my direct supervision.  I personally performed the history, physical, and medical decision making and all procedures in the evaluation of this patient.Mark Lennert, MD 08/24/12 [phone]

## 2012-08-24 NOTE — ED Notes (Signed)
Left foot pain since early this morning, denies injury to foot, hx of reconstructive surgery on same foot

## 2012-08-24 NOTE — ED Notes (Signed)
Pt alert & oriented x4. Patient given discharge instructions, paperwork & prescription(s). Patient verbalized understanding. Pt left department w/ no further questions.  

## 2012-08-24 NOTE — ED Notes (Signed)
Gave patient ice pack at patient request

## 2012-08-26 ENCOUNTER — Ambulatory Visit (INDEPENDENT_AMBULATORY_CARE_PROVIDER_SITE_OTHER): Payer: Managed Care, Other (non HMO) | Admitting: Orthopedic Surgery

## 2012-08-26 ENCOUNTER — Encounter: Payer: Self-pay | Admitting: Orthopedic Surgery

## 2012-08-26 DIAGNOSIS — M109 Gout, unspecified: Secondary | ICD-10-CM

## 2012-08-26 DIAGNOSIS — M7660 Achilles tendinitis, unspecified leg: Secondary | ICD-10-CM

## 2012-08-26 DIAGNOSIS — L089 Local infection of the skin and subcutaneous tissue, unspecified: Secondary | ICD-10-CM

## 2012-08-26 LAB — CBC WITH DIFFERENTIAL/PLATELET
Basophils Absolute: 0.1 10*3/uL (ref 0.0–0.1)
Basophils Relative: 1 % (ref 0–1)
Eosinophils Absolute: 0.5 10*3/uL (ref 0.0–0.7)
Hemoglobin: 15.2 g/dL (ref 13.0–17.0)
MCH: 29.9 pg (ref 26.0–34.0)
MCHC: 35.1 g/dL (ref 30.0–36.0)
Monocytes Relative: 8 % (ref 3–12)
Neutro Abs: 5.4 10*3/uL (ref 1.7–7.7)
Neutrophils Relative %: 51 % (ref 43–77)
Platelets: 171 10*3/uL (ref 150–400)

## 2012-08-26 MED ORDER — PREDNISONE 10 MG PO KIT: 10.0000 mg | PACK | ORAL | Status: DC

## 2012-08-26 NOTE — Patient Instructions (Addendum)
Contrast Baths  No weight bearing  Elevate foot No work  Go to lab for blood work

## 2012-08-27 ENCOUNTER — Telehealth: Payer: Self-pay | Admitting: Orthopedic Surgery

## 2012-08-27 ENCOUNTER — Encounter: Payer: Self-pay | Admitting: Orthopedic Surgery

## 2012-08-27 DIAGNOSIS — M109 Gout, unspecified: Secondary | ICD-10-CM | POA: Insufficient documentation

## 2012-08-27 DIAGNOSIS — L089 Local infection of the skin and subcutaneous tissue, unspecified: Secondary | ICD-10-CM | POA: Insufficient documentation

## 2012-08-27 DIAGNOSIS — M7660 Achilles tendinitis, unspecified leg: Secondary | ICD-10-CM | POA: Insufficient documentation

## 2012-08-27 LAB — URIC ACID: Uric Acid, Serum: 5.3 mg/dL (ref 4.0–7.8)

## 2012-08-27 LAB — SEDIMENTATION RATE: Sed Rate: 9 mm/hr (ref 0–16)

## 2012-08-27 NOTE — Telephone Encounter (Signed)
Laboratory studies relate to the patient  White count 10.4 still normal although high normal Sedimentation rate was normal Uric acid was normal  With a normal sedimentation rate I doubt that he had an infection or any significant inflammatory response such as inflammatory arthritis  My best estimate is that he has Achilles tendon bursitis and tendinitis and midfoot swelling

## 2012-08-27 NOTE — Progress Notes (Signed)
  Subjective:    Patient ID: Mark Phillips, male    DOB: 05-Aug-1971, 41 y.o.   MRN: [id number]  HPI Comments: Status post ankle reconstruction for ankle laxity with Brostrm type repair over 10 years ago presents with acute pain and swelling in his Achilles tendon and retrocalcaneal bursa with medial pain and foot swelling, severe, initially unrelieved by IV pain medicine and oral pain medicine. No trauma  Foot Pain This is a new problem. The current episode started in the past 7 days. The problem occurs constantly. The problem has been gradually improving. Associated symptoms include chest pain, joint swelling and numbness. Pertinent negatives include no chills, rash, urinary symptoms or weakness. The symptoms are aggravated by standing and walking. He has tried rest, oral narcotics, immobilization and heat for the symptoms. The treatment provided mild relief.  Ankle Pain  Associated symptoms include numbness.      Review of Systems  Constitutional: Negative.  Negative for chills.  HENT: Negative.   Cardiovascular: Positive for chest pain.  Gastrointestinal: Negative.   Musculoskeletal: Positive for joint swelling.  Skin: Negative for rash.  Neurological: Positive for numbness. Negative for weakness.       Objective:   Physical Exam  Constitutional: He is oriented to person, place, and time. He appears well-developed and well-nourished.  HENT:  Head: Normocephalic.  Eyes: Pupils are equal, round, and reactive to light.  Neck: Normal range of motion. Neck supple. No JVD present. No tracheal deviation present. No thyromegaly present.  Musculoskeletal:       Right ankle: Normal.       Left ankle: He exhibits decreased range of motion and swelling. He exhibits no ecchymosis, no deformity, no laceration and normal pulse. tenderness. No lateral malleolus, no medial malleolus, no AITFL, no CF ligament, no posterior TFL, no head of 5th metatarsal and no proximal fibula tenderness found.  Achilles tendon exhibits pain. Achilles tendon exhibits no defect and normal Thompson's test results.       Right foot: Normal.  Lymphadenopathy:    He has no cervical adenopathy.  Neurological: He is alert and oriented to person, place, and time. He has normal reflexes. No cranial nerve deficit. He exhibits normal muscle tone. Coordination normal.  Skin: Skin is warm and dry. No rash noted. No erythema. No pallor.  Psychiatric: He has a normal mood and affect. His behavior is normal. Judgment and thought content normal.     X-rays do not add any diagnostic information 2 suture anchors in the fibula also there is a cystlike structure in the fibula consistent with an old drill hole     Assessment & Plan:   1. Gout  CBC w/Diff, Uric acid, Sed Rate (ESR), PredniSONE 10 MG KIT  2. Foot infection  CBC w/Diff, Uric acid, Sed Rate (ESR), PredniSONE 10 MG KIT  3. Achilles bursitis or tendinitis       Recommend diagnostic studies to include blood work differential diagnosis includes gout  Contrast baths nonweightbearing, crutches, medication, prednisone, return in 3-4 days for reevaluation reviewed labs after lab studies obtained

## 2012-08-31 ENCOUNTER — Ambulatory Visit (INDEPENDENT_AMBULATORY_CARE_PROVIDER_SITE_OTHER): Payer: Managed Care, Other (non HMO) | Admitting: Orthopedic Surgery

## 2012-08-31 ENCOUNTER — Encounter: Payer: Self-pay | Admitting: Orthopedic Surgery

## 2012-08-31 DIAGNOSIS — M659 Synovitis and tenosynovitis, unspecified: Secondary | ICD-10-CM

## 2012-08-31 NOTE — Progress Notes (Signed)
Patient ID: Mark Phillips, male   DOB: [birth year], 41 y.o.   MRN: [id number] Chief Complaint  Patient presents with  . Follow-up    left foot swelling     Laboratory studies were normal with a normal white count normal sedimentation rate, normal C-reactive protein. No LEFT shift.  Foot. Swelling has diminished somewhat, ambulating a little bit better. He can get his shoe on at this time.  ROS afebrile   As a lot of tenderness around the ankle joint itself and not so much in the Achilles. Ankle  swelling has gone down. Neurovascular intact  ROM  is normal  stability mild laxity with firm endpoint   Ankle synovitis  Resolving  Expect able to return to work 29th

## 2012-08-31 NOTE — Patient Instructions (Addendum)
Contrast baths: continue.  Continue medications   Return to work on the 29th   SYNOVITIS OF THE ANKLE JOINT

## 2013-01-21 ENCOUNTER — Emergency Department (HOSPITAL_COMMUNITY): Payer: Managed Care, Other (non HMO)

## 2013-01-21 ENCOUNTER — Encounter (HOSPITAL_COMMUNITY): Payer: Self-pay | Admitting: *Deleted

## 2013-01-21 ENCOUNTER — Emergency Department (HOSPITAL_COMMUNITY)
Admission: EM | Admit: 2013-01-21 | Discharge: 2013-01-21 | Disposition: A | Discharge status: Home / Self Care | Payer: Managed Care, Other (non HMO) | Attending: Emergency Medicine | Admitting: Emergency Medicine

## 2013-01-21 DIAGNOSIS — F17200 Nicotine dependence, unspecified, uncomplicated: Secondary | ICD-10-CM | POA: Insufficient documentation

## 2013-01-21 DIAGNOSIS — Z79899 Other long term (current) drug therapy: Secondary | ICD-10-CM | POA: Insufficient documentation

## 2013-01-21 DIAGNOSIS — M25511 Pain in right shoulder: Secondary | ICD-10-CM

## 2013-01-21 DIAGNOSIS — S4980XA Other specified injuries of shoulder and upper arm, unspecified arm, initial encounter: Secondary | ICD-10-CM | POA: Insufficient documentation

## 2013-01-21 DIAGNOSIS — Y9389 Activity, other specified: Secondary | ICD-10-CM | POA: Insufficient documentation

## 2013-01-21 DIAGNOSIS — Z8639 Personal history of other endocrine, nutritional and metabolic disease: Secondary | ICD-10-CM | POA: Insufficient documentation

## 2013-01-21 DIAGNOSIS — X503XXA Overexertion from repetitive movements, initial encounter: Secondary | ICD-10-CM | POA: Insufficient documentation

## 2013-01-21 DIAGNOSIS — Z862 Personal history of diseases of the blood and blood-forming organs and certain disorders involving the immune mechanism: Secondary | ICD-10-CM | POA: Insufficient documentation

## 2013-01-21 DIAGNOSIS — Y929 Unspecified place or not applicable: Secondary | ICD-10-CM | POA: Insufficient documentation

## 2013-01-21 DIAGNOSIS — I10 Essential (primary) hypertension: Secondary | ICD-10-CM | POA: Insufficient documentation

## 2013-01-21 DIAGNOSIS — S46909A Unspecified injury of unspecified muscle, fascia and tendon at shoulder and upper arm level, unspecified arm, initial encounter: Secondary | ICD-10-CM | POA: Insufficient documentation

## 2013-01-21 MED ORDER — OXYCODONE-ACETAMINOPHEN 5-325 MG PO TABS
1.0000 | ORAL_TABLET | Freq: Once | ORAL | Status: AC
  Administered 2013-01-21: 1 via ORAL
  Filled 2013-01-21: qty 1

## 2013-01-21 MED ORDER — NAPROXEN 500 MG PO TABS: 500.0000 mg | ORAL_TABLET | Freq: Two times a day (BID) | ORAL | Status: DC

## 2013-01-21 MED ORDER — OXYCODONE-ACETAMINOPHEN 5-325 MG PO TABS: 1.0000 | ORAL_TABLET | ORAL | Status: DC | PRN

## 2013-01-21 NOTE — ED Provider Notes (Signed)
History     CSN: [id number]  Arrival date & time 01/21/13  1544   First MD Initiated Contact with Patient 01/21/13 1606      Chief Complaint  Patient presents with  . Shoulder Pain    (Consider location/radiation/quality/duration/timing/severity/associated sxs/prior treatment) HPI Comments: Patient c/o pain to his right shoulder after he tried to pick up a large rock.  States he felt a sharp pain to his shoulder and now has pain when he tries to raise his right arm or rotate his right shoulder.  He denies fever, neck pain or stiffness, headache, chest pain, or  numbness or tingling of his right arm  Patient is a 42 y.o. male presenting with shoulder injury. The history is provided by the patient.  Shoulder Injury This is a new problem. The current episode started in the past 7 days. The problem occurs constantly. The problem has been unchanged. Associated symptoms include arthralgias. Pertinent negatives include no abdominal pain, chest pain, chills, congestion, diaphoresis, fever, headaches, joint swelling, nausea, neck pain, numbness, rash, vertigo or weakness. The symptoms are aggravated by bending (movement of the right arm). He has tried NSAIDs for the symptoms. The treatment provided mild relief.    Past Medical History  Diagnosis Date  . Hypertension   . High cholesterol     Past Surgical History  Procedure Laterality Date  . Colon surgery    . Ankle reconstruction      lt reconstruction and rt scope sx    Family History  Problem Relation Age of Onset  . Heart disease    . Arthritis    . Lung disease    . Cancer    . Asthma    . Diabetes      History  Substance Use Topics  . Smoking status: Current Every Day Smoker -- 1.00 packs/day    Types: Cigarettes  . Smokeless tobacco: Not on file  . Alcohol Use: No      Review of Systems  Constitutional: Negative for fever, chills and diaphoresis.  HENT: Negative for congestion, neck pain and neck stiffness.    Respiratory: Negative for chest tightness and shortness of breath.   Cardiovascular: Negative for chest pain.  Gastrointestinal: Negative for nausea and abdominal pain.  Genitourinary: Negative for dysuria and difficulty urinating.  Musculoskeletal: Positive for arthralgias. Negative for joint swelling.  Skin: Negative for color change, rash and wound.  Neurological: Negative for dizziness, vertigo, weakness, numbness and headaches.  All other systems reviewed and are negative.    Allergies  Albuterol  Home Medications   Current Outpatient Rx  Name  Route  Sig  Dispense  Refill  . ibuprofen (ADVIL,MOTRIN) 200 MG tablet   Oral   Take 600 mg by mouth every 8 (eight) hours as needed. For pain         . omeprazole (PRILOSEC) 20 MG capsule   Oral   Take 20 mg by mouth daily as needed. For acid reflux         . oxyCODONE-acetaminophen (PERCOCET/ROXICET) 5-325 MG per tablet   Oral   Take 1 tablet by mouth every 6 (six) hours as needed for pain.   6 tablet   0   . PredniSONE 10 MG KIT   Oral   Take 1 kit (10 mg total) by mouth as directed.   1 kit   0   . Simethicone (GAS RELIEF) 180 MG CAPS   Oral   Take 1 capsule by mouth daily as  needed. For relief           BP 142/82  Pulse 74  Temp(Src) 98.5 F (36.9 C) (Oral)  Resp 20  Ht 6' (1.829 m)  Wt 200 lb (90.719 kg)  BMI 27.12 kg/m2  SpO2 100%  Physical Exam  Nursing note and vitals reviewed. Constitutional: He is oriented to person, place, and time. He appears well-developed and well-nourished. No distress.  HENT:  Head: Normocephalic and atraumatic.  Neck: Normal range of motion. Neck supple. No thyromegaly present.  Cardiovascular: Normal rate, regular rhythm, normal heart sounds and intact distal pulses.   No murmur heard. Pulmonary/Chest: Effort normal and breath sounds normal. No respiratory distress. He exhibits no tenderness.  Musculoskeletal: He exhibits tenderness. He exhibits no edema.  ttp of  the anterior right shoulder.  Pain with abduction of the right arm and rotation of the shoulder.  Radial pulse is brisk, distal sensation intact, CR< 2 sec.  No abrasions, edema or step off deformity of the joint.   Lymphadenopathy:    He has no cervical adenopathy.  Neurological: He is alert and oriented to person, place, and time. No cranial nerve deficit or sensory deficit. He exhibits normal muscle tone. Coordination normal.  Reflex Scores:      Tricep reflexes are 2+ on the right side and 2+ on the left side.      Bicep reflexes are 2+ on the right side and 2+ on the left side. Skin: Skin is warm and dry.    ED Course  Procedures (including critical care time)  Labs Reviewed - No data to display Dg Shoulder Right  01/21/2013  *RADIOLOGY REPORT*  Clinical Data: Right shoulder pain.  RIGHT SHOULDER - 2+ VIEW  Comparison: None.  Findings: No acute bony or joint abnormality is identified.  There is some acromioclavicular degenerative change.  Imaged right lung and ribs appear normal.  IMPRESSION: No acute finding.  Acromioclavicular osteoarthritis.   Original Report Authenticated By: Holley Dexter, M.D.         MDM   Sling applied for comfort.  Pt agrees to not wear continuously.  Prefers to f/u with Dr. Romeo Apple or Delbert Harness in [place]  Will prescribe percocet and naprosyn.  He agrees to elevate and ice.  Pain with abduction or rotation of the right shoulder.  NV intact.  Concerning for rotator cuff or labral injury.       Sabella Traore L. Trisha Mangle, PA-C 01/25/13 1550

## 2013-01-21 NOTE — ED Notes (Signed)
Pain rt shoulder when trying to lift a rock.

## 2013-01-26 NOTE — ED Provider Notes (Signed)
Medical screening examination/treatment/procedure(s) were performed by non-physician practitioner and as supervising physician I was immediately available for consultation/collaboration.  Donnetta Hutching, MD 01/26/13 [phone]

## 2013-02-03 ENCOUNTER — Telehealth: Payer: Self-pay | Admitting: Orthopedic Surgery

## 2013-02-03 NOTE — Telephone Encounter (Signed)
Patient cancelled the 02/04/13 follow/up appointment from Emergency Room , scheduled per phone on 01/25/13.  Offered re-schedule, patient states does not wish to re-schedule at this time.

## 2013-02-04 ENCOUNTER — Ambulatory Visit: Payer: Managed Care, Other (non HMO) | Admitting: Orthopedic Surgery

## 2013-02-16 ENCOUNTER — Other Ambulatory Visit: Payer: Self-pay | Admitting: Orthopedic Surgery

## 2013-02-18 ENCOUNTER — Encounter (HOSPITAL_COMMUNITY): Payer: Self-pay | Admitting: Pharmacy Technician

## 2013-02-18 ENCOUNTER — Other Ambulatory Visit (HOSPITAL_COMMUNITY): Payer: Self-pay | Admitting: Specialist

## 2013-02-18 NOTE — Patient Instructions (Addendum)
20 SEDRICK TOBER  02/18/2013   Your procedure is scheduled on: 02-25-2013  Report to [facility] at 530 AM.  Call this number if you have problems the morning of surgery [phone]   Remember:   Do not eat food or drink liquids :After Midnight.     Take these medicines the morning of surgery with A SIP OF WATER: percocet if needed                                SEE [facility] PREPARING FOR SURGERY SHEET   Do not wear jewelry, make-up or nail polish.  Do not wear lotions, powders, or perfumes. You may wear deodorant.   Men may shave face and neck.  Do not bring valuables to the hospital.  Contacts, dentures or bridgework may not be worn into surgery.  Leave suitcase in the car. After surgery it may be brought to your room.  For patients admitted to the hospital, checkout time is 11:00 AM the day of discharge.    Please read over the following fact sheets that you were given: MRSA Information, incentive spirometry fact sheet  Call Birdie Sons RN pre op nurse if needed [phone]    FAILURE TO FOLLOW THESE INSTRUCTIONS MAY RESULT IN THE CANCELLATION OF YOUR SURGERY. PATIENT SIGNATURE___________________________________________

## 2013-02-19 ENCOUNTER — Encounter (HOSPITAL_COMMUNITY): Payer: Self-pay

## 2013-02-19 ENCOUNTER — Encounter (HOSPITAL_COMMUNITY)
Admission: RE | Admit: 2013-02-19 | Discharge: 2013-02-19 | Disposition: A | Discharge status: Home / Self Care | Payer: Managed Care, Other (non HMO) | Source: Ambulatory Visit | Attending: Specialist | Admitting: Specialist

## 2013-02-19 HISTORY — DX: Sleep apnea, unspecified: G47.30

## 2013-02-19 HISTORY — DX: Other specified postprocedural states: Z98.890

## 2013-02-19 HISTORY — DX: Other specified postprocedural states: R11.2

## 2013-02-19 HISTORY — DX: Gastro-esophageal reflux disease without esophagitis: K21.9

## 2013-02-19 LAB — CBC
Hemoglobin: 15.8 g/dL (ref 13.0–17.0)
MCH: 29.6 pg (ref 26.0–34.0)
MCHC: 34.1 g/dL (ref 30.0–36.0)
Platelets: 178 10*3/uL (ref 150–400)
RDW: 12.9 % (ref 11.5–15.5)

## 2013-02-19 LAB — SURGICAL PCR SCREEN
MRSA, PCR: NEGATIVE
Staphylococcus aureus: NEGATIVE

## 2013-02-19 NOTE — Progress Notes (Signed)
Stress test 12/22/08 on chart, Chest x-ray 08/08/12 on [facility], EKG 08/08/12 on [facility]

## 2013-02-23 ENCOUNTER — Other Ambulatory Visit: Payer: Self-pay | Admitting: Orthopedic Surgery

## 2013-02-23 NOTE — H&P (Signed)
Mark Phillips is an 42 y.o. male.   Chief Complaint: right shoulder pain HPI: RHD male c/o R shoulder pain x 1 yr duration which initially started without injury. 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 He was having daily pain in the R shoulder which worsened with increased use. He also sleeps with his R arm overhead. 4 weeks ago ago he attempted to move a large rock - lifted it in front of him, then felt a sudden pain in the R shoulder into the R Arm. He initially was unable to even move the arm at all, it was so painful, but that has improved slightly. He notes occasional numbness and tingling into the arm, to the elbow and occasionally to the forearm. Notes some chronic neck stiffness. Was seen in the ER where xrays were taken and he was given Rx for pain medication and NSAIDs which are not helpful. Does note some weakness in the R shoulder. Subacromial steroid injection with worsening pain. Refractory to HEP.  Past Medical History  Diagnosis Date  . Hypertension   . High cholesterol   . Anginal pain     2010  . GERD (gastroesophageal reflux disease)     hx of  . Arthritis   . PONV (postoperative nausea and vomiting)   . Sleep apnea     Past Surgical History  Procedure Laterality Date  . Colon surgery    . Ankle reconstruction      lt reconstruction and rt scope sx  . Appendectomy  2000  . Wisdom tooth extraction  2011    Family History  Problem Relation Age of Onset  . Heart disease    . Arthritis    . Lung disease    . Cancer    . Asthma    . Diabetes     Social History:  reports that he has been smoking Cigarettes.  He has a 19 pack-year smoking history. He quit smokeless tobacco use about 19 years ago. His smokeless tobacco use included Chew. He reports that he does not drink alcohol or use illicit drugs.  Allergies:  Allergies  Allergen Reactions  .  Albuterol Swelling     (Not in a hospital admission)  No results found for this or any previous visit (from the past 48 hour(s)). No results found.  Review of Systems  Constitutional: Negative.   HENT: Negative.   Eyes: Negative.   Respiratory: Negative.   Cardiovascular: Negative.   Gastrointestinal: Negative.   Genitourinary: Negative.   Musculoskeletal: Positive for joint pain.  Skin: Negative.   Neurological: Negative.   Endo/Heme/Allergies: Negative.   Psychiatric/Behavioral: Negative.     There were no vitals taken for this visit. Physical Exam  Constitutional: He is oriented to person, place, and time. He appears well-developed and well-nourished.  HENT:  Head: Normocephalic and atraumatic.  Eyes: Conjunctivae and EOM are normal. Pupils are equal, round, and reactive to light.  Neck: Normal range of motion. Neck supple.  Cardiovascular: Normal rate and regular rhythm.   Respiratory: Effort normal and breath sounds normal.  GI: Soft. Bowel sounds are normal.  Musculoskeletal:  On exam positive impingement signs, positive secondary impingement sign of the shoulder. Nontender over the [facility].  Inspection of the shoulder revealed no ecchymosis, soft tissue swelling, or deformity. On palpation, nontender in the subacromial region. On range of motion the  patient had full range of motion. Provocative signs indicated no sulcus sign, negative speed's test. Negative lift off. Sensory exam was intact and motor function was normal in the deltoid and the rotator cuff.   Neurological: He is alert and oriented to person, place, and time. He has normal reflexes.  Skin: Skin is warm and dry.  Psychiatric: He has a normal mood and affect.   R shoulder xrays from [facility] system reviewed with no fx, subluxation, dislocation, lytic or blastic lesions. Mild AC joint degenerative changes. Type 2 acromion. Decreased subacromial space.  MRI demonstrates full thickness supraspinatus tear,  1-2 centimeters of retraction.   Assessment/Plan Right shoulder RCT Full thickness rotator cuff tear, supraspinatus, mild retraction.  1. Discussed options to proceed with arthroscopic assisted rotator cuff repair.  I had a long discussion with the patient concerning the risks and benefits of the proposed shoulder surgery including need for rotator cuff repair, infection, suboptimal range of motion, adhesive capsulitis, and recurrent tear requiring further surgery. We also discussed the extended recovery requirement for postoperative physical therapy and the time to maximum recovery. I provided the patient with an illustrated handout and discussed that in detail. We also discussed anesthetic complications, DVT, PE, cardiopulmonary dysfunction, etc.  2. He had some issues with his AC joint in the past, discussed distal clavicle resection. Obtained two view radiographs today, he does not have significant severe AC arthrosis and we will put that as a possibility for DCR.  Plan right shoulder arthroscopy, SAD, mini-open RCR, possible DCR  Ladawn Boullion M. for Dr. Shelle Iron 02/23/2013, 1:49 PM

## 2013-02-24 ENCOUNTER — Other Ambulatory Visit: Payer: Self-pay | Admitting: Orthopedic Surgery

## 2013-02-24 NOTE — H&P (Signed)
[facility] [address]   [address] [place], [place] [address] [phone] A Division of [facility]  Loreta Ave, M.D.   Robert A. Thurston Hole, M.D.   Burnell Blanks, M.D.   Eulas Post, M.D.   Lunette Stands, M.D Buford Dresser, M.D.  Charlsie Quest, M.D.   Estell Harpin, M.D.   Melina Fiddler, M.D. Genene Churn. Barry Dienes, PA-C            Kirstin A. Shepperson, PA-C Josh Lakota, PA-C [place], [place]   RE: Henrene Hawking                                [phone]      DOB: 09/27/1967 INITIAL EVALUATION:  02-02-13 Chief complaint: Left knee pain.  History of present illness: 29 four year-old white male who is a new patient to the office.  Comes in for a self solicited second opinion regarding his left knee.  He states that left knee pain ongoing for about one year.  He cannot recall any specific injury.  Pain is aggravated with walking, running, squatting and stairs.  He does have some feeling of catching, mostly medial.  No instability.  He was seen by Dr. Shelle Iron last year with [facility] and he did an intraarticular Depo-Medrol/Marcaine injection.  This gave some improvement for a few days.  Dr. Shelle Iron had recommended getting an MRI scan for possible meniscus tear, but there were some issues at that practice and so the patient decided to come here for evaluation and treatment.  No lumbar spine, hip or radicular component.   Current medications: None. Allergies: No known drug allergies. Past medical/surgical history: Right knee arthroscopy for meniscus tear in 2008 in [place], ORIF right ankle in 2007 which was complicated by post-op infection that required a PIC line.   Review of systems: All other systems are unremarkable.   Family history: Positive for diabetes, hypertension and heart disease.  Social history: Does not smoke or drink.  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      EXAMINATION: Pleasant  white male, alert and oriented x 3 and in no acute distress.  Height: 6?1.  Weight: 205 pounds.  Blood pressure: 127/81.  Gait is minimally antalgic.  Good painless range of motion bilateral hips.  Left knee good range of motion.  Minimal swelling without significant palpable effusion.  He is exquisitely tender at the medial joint line.  Lateral joint line non-tender.  Positive McMurray's.  Negative patellar apprehension.  Cruciate and collateral ligaments stable.  Tender medial plica.  Calf non-tender.  Neurovascularly intact.  Skin warm and dry.      X-RAYS: Left knee, AP, lateral and sunrise views, show some tricompartmental narrowing, but otherwise looks pretty good.  No acute findings.  No obvious loose body.  IMPRESSION: Chronic left knee pain, likely due to medial meniscus tear.  Failed conservative treatment with previous intraarticular Depo-Medrol/Marcaine injection.  PLAN: We will schedule an MRI of the left knee to rule out meniscus tear.  He will call two days after completion of scan to discuss results.  Today pre-op paperwork filled out and patient was advised that he will likely need outpatient arthroscopy with debridement.  Surgical procedure briefly discussed, along with potential rehab/recovery time.  All questions answered.    Loreta Ave, M.D. Electronically verified by Loreta Ave, M.D. DFM(JMO):jjh D 02-03-13

## 2013-02-25 ENCOUNTER — Encounter (HOSPITAL_COMMUNITY): Payer: Self-pay | Admitting: Anesthesiology

## 2013-02-25 ENCOUNTER — Ambulatory Visit (HOSPITAL_COMMUNITY): Payer: Managed Care, Other (non HMO) | Admitting: Anesthesiology

## 2013-02-25 ENCOUNTER — Encounter (HOSPITAL_COMMUNITY): Payer: Self-pay | Admitting: *Deleted

## 2013-02-25 ENCOUNTER — Encounter (HOSPITAL_COMMUNITY)
Admission: RE | Disposition: A | Discharge status: Home / Self Care | Payer: Self-pay | Source: Ambulatory Visit | Attending: Specialist

## 2013-02-25 ENCOUNTER — Ambulatory Visit (HOSPITAL_COMMUNITY)
Admission: RE | Admit: 2013-02-25 | Discharge: 2013-02-25 | Disposition: A | Discharge status: Home / Self Care | Payer: Managed Care, Other (non HMO) | Source: Ambulatory Visit | Attending: Specialist | Admitting: Specialist

## 2013-02-25 DIAGNOSIS — K219 Gastro-esophageal reflux disease without esophagitis: Secondary | ICD-10-CM | POA: Insufficient documentation

## 2013-02-25 DIAGNOSIS — M719 Bursopathy, unspecified: Secondary | ICD-10-CM | POA: Insufficient documentation

## 2013-02-25 DIAGNOSIS — I10 Essential (primary) hypertension: Secondary | ICD-10-CM | POA: Insufficient documentation

## 2013-02-25 DIAGNOSIS — E7800 Pure hypercholesterolemia, unspecified: Secondary | ICD-10-CM | POA: Insufficient documentation

## 2013-02-25 DIAGNOSIS — M67919 Unspecified disorder of synovium and tendon, unspecified shoulder: Secondary | ICD-10-CM | POA: Insufficient documentation

## 2013-02-25 DIAGNOSIS — F17200 Nicotine dependence, unspecified, uncomplicated: Secondary | ICD-10-CM | POA: Insufficient documentation

## 2013-02-25 DIAGNOSIS — G4730 Sleep apnea, unspecified: Secondary | ICD-10-CM | POA: Insufficient documentation

## 2013-02-25 DIAGNOSIS — M75101 Unspecified rotator cuff tear or rupture of right shoulder, not specified as traumatic: Secondary | ICD-10-CM

## 2013-02-25 HISTORY — PX: SHOULDER ARTHROSCOPY WITH SUBACROMIAL DECOMPRESSION AND OPEN ROTATOR C: SHX5688

## 2013-02-25 LAB — CREATININE, SERUM
GFR calc Af Amer: >90 mL/min (ref >90)
GFR calc non Af Amer: >90 mL/min (ref >90)

## 2013-02-25 LAB — CBC
MCV: 86.8 fL (ref 78.0–100.0)
Platelets: 144 10*3/uL — ABNORMAL LOW (ref 150–400)
RBC: 4.79 MIL/uL (ref 4.22–5.81)
WBC: 16.9 10*3/uL — ABNORMAL HIGH (ref 4.0–10.5)

## 2013-02-25 SURGERY — SHOULDER ARTHROSCOPY WITH SUBACROMIAL DECOMPRESSION AND OPEN ROTATOR CUFF REPAIR, OPEN BICEPS TENDON REPAIR
Anesthesia: General | Site: Shoulder | Laterality: Right | Wound class: Clean

## 2013-02-25 MED ORDER — DEXTROSE 5 % IV SOLN
500.0000 mg | Freq: Four times a day (QID) | INTRAVENOUS | Status: DC | PRN
  Filled 2013-02-25: qty 5

## 2013-02-25 MED ORDER — ACETAMINOPHEN 325 MG PO TABS: 650.0000 mg | ORAL_TABLET | Freq: Four times a day (QID) | ORAL | Status: DC | PRN

## 2013-02-25 MED ORDER — HYDROMORPHONE HCL PF 1 MG/ML IJ SOLN
0.5000 mg | INTRAMUSCULAR | Status: DC | PRN
  Administered 2013-02-25: 1 mg via INTRAVENOUS
  Filled 2013-02-25: qty 1

## 2013-02-25 MED ORDER — HYDROCODONE-ACETAMINOPHEN 5-325 MG PO TABS
1.0000 | ORAL_TABLET | ORAL | Status: DC | PRN
  Administered 2013-02-25 (×2): 2 via ORAL
  Filled 2013-02-25 (×2): qty 2

## 2013-02-25 MED ORDER — ACETAMINOPHEN 650 MG RE SUPP
650.0000 mg | Freq: Four times a day (QID) | RECTAL | Status: DC | PRN
  Filled 2013-02-25: qty 1

## 2013-02-25 MED ORDER — CEFAZOLIN SODIUM-DEXTROSE 2-3 GM-% IV SOLR
2.0000 g | INTRAVENOUS | Status: AC
  Administered 2013-02-25: 2 g via INTRAVENOUS

## 2013-02-25 MED ORDER — MIDAZOLAM HCL 5 MG/5ML IJ SOLN
INTRAMUSCULAR | Status: DC | PRN
  Administered 2013-02-25: 2 mg via INTRAVENOUS

## 2013-02-25 MED ORDER — ONDANSETRON HCL 4 MG/2ML IJ SOLN: 4.0000 mg | Freq: Four times a day (QID) | INTRAMUSCULAR | Status: DC | PRN

## 2013-02-25 MED ORDER — ACETAMINOPHEN 10 MG/ML IV SOLN
INTRAVENOUS | Status: DC | PRN
  Administered 2013-02-25: 1000 mg via INTRAVENOUS

## 2013-02-25 MED ORDER — BUPIVACAINE-EPINEPHRINE (PF) 0.5% -1:200000 IJ SOLN
INTRAMUSCULAR | Status: AC
  Filled 2013-02-25: qty 10

## 2013-02-25 MED ORDER — LACTATED RINGERS IV SOLN
INTRAVENOUS | Status: DC
  Administered 2013-02-25 (×2): via INTRAVENOUS

## 2013-02-25 MED ORDER — NEOSTIGMINE METHYLSULFATE 1 MG/ML IJ SOLN
INTRAMUSCULAR | Status: DC | PRN
  Administered 2013-02-25: 3 mg via INTRAVENOUS

## 2013-02-25 MED ORDER — LACTATED RINGERS IR SOLN
Status: DC | PRN
  Administered 2013-02-25: 6000 mL

## 2013-02-25 MED ORDER — PHENOL 1.4 % MT LIQD: 1.0000 | OROMUCOSAL | Status: DC | PRN

## 2013-02-25 MED ORDER — MENTHOL 3 MG MT LOZG: 1.0000 | LOZENGE | OROMUCOSAL | Status: DC | PRN

## 2013-02-25 MED ORDER — SUCCINYLCHOLINE CHLORIDE 20 MG/ML IJ SOLN
INTRAMUSCULAR | Status: DC | PRN
  Administered 2013-02-25: 100 mg via INTRAVENOUS

## 2013-02-25 MED ORDER — LIDOCAINE HCL (CARDIAC) 20 MG/ML IV SOLN
INTRAVENOUS | Status: DC | PRN
  Administered 2013-02-25: 30 mg via INTRAVENOUS

## 2013-02-25 MED ORDER — METHOCARBAMOL 500 MG PO TABS: 500.0000 mg | ORAL_TABLET | Freq: Three times a day (TID) | ORAL | Status: DC

## 2013-02-25 MED ORDER — BUPIVACAINE-EPINEPHRINE 0.5% -1:200000 IJ SOLN
INTRAMUSCULAR | Status: DC | PRN
  Administered 2013-02-25: 30 mL

## 2013-02-25 MED ORDER — SODIUM CHLORIDE 0.9 % IR SOLN
Status: DC | PRN
  Administered 2013-02-25: 10:00:00

## 2013-02-25 MED ORDER — HYDROMORPHONE HCL PF 1 MG/ML IJ SOLN: 0.2500 mg | INTRAMUSCULAR | Status: DC | PRN

## 2013-02-25 MED ORDER — EPINEPHRINE HCL 1 MG/ML IJ SOLN
INTRAMUSCULAR | Status: DC | PRN
  Administered 2013-02-25: 2 mL

## 2013-02-25 MED ORDER — KETAMINE HCL 10 MG/ML IJ SOLN
INTRAMUSCULAR | Status: DC | PRN
  Administered 2013-02-25 (×2): 10 mg via INTRAVENOUS

## 2013-02-25 MED ORDER — KETOROLAC TROMETHAMINE 30 MG/ML IJ SOLN
INTRAMUSCULAR | Status: AC
  Filled 2013-02-25: qty 1

## 2013-02-25 MED ORDER — METHOCARBAMOL 500 MG PO TABS
500.0000 mg | ORAL_TABLET | Freq: Four times a day (QID) | ORAL | Status: DC | PRN
  Administered 2013-02-25: 500 mg via ORAL
  Filled 2013-02-25: qty 1

## 2013-02-25 MED ORDER — OXYCODONE-ACETAMINOPHEN 7.5-325 MG PO TABS: 1.0000 | ORAL_TABLET | ORAL | Status: DC | PRN

## 2013-02-25 MED ORDER — SODIUM CHLORIDE 0.45 % IV SOLN
INTRAVENOUS | Status: DC
  Administered 2013-02-25: 15:00:00 via INTRAVENOUS

## 2013-02-25 MED ORDER — ONDANSETRON HCL 4 MG PO TABS
4.0000 mg | ORAL_TABLET | Freq: Four times a day (QID) | ORAL | Status: DC | PRN
  Filled 2013-02-25: qty 1

## 2013-02-25 MED ORDER — KETOROLAC TROMETHAMINE 30 MG/ML IJ SOLN
30.0000 mg | Freq: Four times a day (QID) | INTRAMUSCULAR | Status: DC
  Administered 2013-02-25 (×2): 30 mg via INTRAVENOUS
  Filled 2013-02-25: qty 1

## 2013-02-25 MED ORDER — GLYCOPYRROLATE 0.2 MG/ML IJ SOLN
INTRAMUSCULAR | Status: DC | PRN
  Administered 2013-02-25: 0.4 mg via INTRAVENOUS

## 2013-02-25 MED ORDER — OXYCODONE-ACETAMINOPHEN 5-325 MG PO TABS: 1.0000 | ORAL_TABLET | ORAL | Status: DC | PRN

## 2013-02-25 MED ORDER — EPINEPHRINE HCL 1 MG/ML IJ SOLN
INTRAMUSCULAR | Status: AC
  Filled 2013-02-25: qty 2

## 2013-02-25 MED ORDER — PROMETHAZINE HCL 25 MG/ML IJ SOLN: 6.2500 mg | INTRAMUSCULAR | Status: DC | PRN

## 2013-02-25 MED ORDER — METOCLOPRAMIDE HCL 5 MG PO TABS
5.0000 mg | ORAL_TABLET | Freq: Three times a day (TID) | ORAL | Status: DC | PRN
  Filled 2013-02-25: qty 2

## 2013-02-25 MED ORDER — DOCUSATE SODIUM 100 MG PO CAPS
100.0000 mg | ORAL_CAPSULE | Freq: Two times a day (BID) | ORAL | Status: DC
  Filled 2013-02-25 (×3): qty 1

## 2013-02-25 MED ORDER — CEFAZOLIN SODIUM-DEXTROSE 2-3 GM-% IV SOLR
2.0000 g | Freq: Four times a day (QID) | INTRAVENOUS | Status: DC
  Administered 2013-02-25 (×2): 2 g via INTRAVENOUS
  Filled 2013-02-25 (×3): qty 50

## 2013-02-25 MED ORDER — CLINDAMYCIN PHOSPHATE 900 MG/50ML IV SOLN
900.0000 mg | INTRAVENOUS | Status: AC
  Administered 2013-02-25: 900 mg via INTRAVENOUS

## 2013-02-25 MED ORDER — CISATRACURIUM BESYLATE (PF) 10 MG/5ML IV SOLN
INTRAVENOUS | Status: DC | PRN
  Administered 2013-02-25: 10 mg via INTRAVENOUS
  Administered 2013-02-25: 3 mg via INTRAVENOUS
  Administered 2013-02-25: 1 mg via INTRAVENOUS

## 2013-02-25 MED ORDER — EPHEDRINE SULFATE 50 MG/ML IJ SOLN
INTRAMUSCULAR | Status: DC | PRN
  Administered 2013-02-25: 5 mg via INTRAVENOUS
  Administered 2013-02-25: 10 mg via INTRAVENOUS

## 2013-02-25 MED ORDER — METOCLOPRAMIDE HCL 5 MG/ML IJ SOLN: 5.0000 mg | Freq: Three times a day (TID) | INTRAMUSCULAR | Status: DC | PRN

## 2013-02-25 MED ORDER — ENOXAPARIN SODIUM 40 MG/0.4ML ~~LOC~~ SOLN
40.0000 mg | SUBCUTANEOUS | Status: DC
  Filled 2013-02-25 (×2): qty 0.4

## 2013-02-25 MED ORDER — FENTANYL CITRATE 0.05 MG/ML IJ SOLN
INTRAMUSCULAR | Status: DC | PRN
  Administered 2013-02-25 (×2): 100 ug via INTRAVENOUS
  Administered 2013-02-25: 50 ug via INTRAVENOUS

## 2013-02-25 MED ORDER — CLINDAMYCIN PHOSPHATE 900 MG/50ML IV SOLN
INTRAVENOUS | Status: AC
  Filled 2013-02-25: qty 50

## 2013-02-25 SURGICAL SUPPLY — 40 items
ANCH SUT 2 CP-2 ROTR CUF (Anchor) ×2 IMPLANT
ANCH SUT PUSHLCK 24X4.5 STRL (Orthopedic Implant) ×2 IMPLANT
ANCHOR ROTATOR CUFF #2 (Anchor) ×2 IMPLANT
BLADE CUTTER GATOR 3.5 (BLADE) ×2 IMPLANT
BLADE SURG SZ11 CARB STEEL (BLADE) ×2 IMPLANT
CANNULA ACUFO 5X76 (CANNULA) ×2 IMPLANT
CLEANER TIP ELECTROSURG 2X2 (MISCELLANEOUS) ×2 IMPLANT
CLOTH 2% CHLOROHEXIDINE 3PK (PERSONAL CARE ITEMS) ×2 IMPLANT
CLOTH BEACON ORANGE TIMEOUT ST (SAFETY) ×2 IMPLANT
DECANTER SPIKE VIAL GLASS SM (MISCELLANEOUS) ×2 IMPLANT
DRAPE POUCH INSTRU U-SHP 10X18 (DRAPES) ×2 IMPLANT
DRAPE STERI 35X30 U-POUCH (DRAPES) ×1 IMPLANT
DRSG AQUACEL AG ADV 3.5X 6 (GAUZE/BANDAGES/DRESSINGS) ×1 IMPLANT
DURAPREP 26ML APPLICATOR (WOUND CARE) ×2 IMPLANT
GLOVE BIOGEL PI IND STRL 7.5 (GLOVE) ×1 IMPLANT
GLOVE BIOGEL PI IND STRL 8 (GLOVE) ×1 IMPLANT
GLOVE BIOGEL PI INDICATOR 7.5 (GLOVE) ×1
GLOVE BIOGEL PI INDICATOR 8 (GLOVE) ×1
GLOVE SURG SS PI 7.5 STRL IVOR (GLOVE) ×2 IMPLANT
GLOVE SURG SS PI 8.0 STRL IVOR (GLOVE) ×3 IMPLANT
KIT BASIN OR (CUSTOM PROCEDURE TRAY) ×2 IMPLANT
KIT POSITION SHOULDER SCHLEI (MISCELLANEOUS) ×1 IMPLANT
MANIFOLD NEPTUNE II (INSTRUMENTS) ×3 IMPLANT
NDL MA TROC 1/2 CIR (NEEDLE) IMPLANT
NDL SPNL 18GX3.5 QUINCKE PK (NEEDLE) ×1 IMPLANT
NEEDLE MA TROC 1/2 CIR (NEEDLE) ×2 IMPLANT
NEEDLE SPNL 18GX3.5 QUINCKE PK (NEEDLE) ×2 IMPLANT
PACK SHOULDER CUSTOM OPM052 (CUSTOM PROCEDURE TRAY) ×2 IMPLANT
POSITIONER SURGICAL ARM (MISCELLANEOUS) ×2 IMPLANT
PUSHLOCK PEEK 4.5X24 (Orthopedic Implant) ×2 IMPLANT
SET ARTHROSCOPY TUBING (MISCELLANEOUS) ×2
SET ARTHROSCOPY TUBING LN (MISCELLANEOUS) ×1 IMPLANT
SLING ULTRA II L (ORTHOPEDIC SUPPLIES) ×2 IMPLANT
STRIP CLOSURE SKIN 1/2X4 (GAUZE/BANDAGES/DRESSINGS) ×1 IMPLANT
SUCTION FRAZIER TIP 10 FR DISP (SUCTIONS) IMPLANT
SUT ETHILON 4 0 PS 2 18 (SUTURE) ×2 IMPLANT
SUT VIC AB 1-0 CT2 27 (SUTURE) ×1 IMPLANT
SUT VIC AB 2-0 CT2 27 (SUTURE) ×2 IMPLANT
SUT VICRYL 0-0 OS 2 NEEDLE (SUTURE) ×2 IMPLANT
TUBING CONNECTING 10 (TUBING) ×2 IMPLANT

## 2013-02-25 NOTE — Transfer of Care (Signed)
Immediate Anesthesia Transfer of Care Note  Patient: Mark Phillips  Procedure(s) Performed: Procedure(s): RIGHT SHOULDER ARTHROSCOPY WITH SUBACROMIAL DECOMPRESSION AND MINI OPEN ROTATOR CUFF REPAIR (Right)  Patient Location: PACU  Anesthesia Type:General  Level of Consciousness: awake, alert , oriented, sedated and patient cooperative  Airway & Oxygen Therapy: Patient Spontanous Breathing and Patient connected to face mask oxygen  Post-op Assessment: Report given to PACU RN and Post -op Vital signs reviewed and stable  Post vital signs: stable  Complications: No apparent anesthesia complications

## 2013-02-25 NOTE — Progress Notes (Signed)
Patient ID: Mark Phillips, male   DOB: Jan 19, 1971, 42 y.o.   MRN: [id number] Received call from patient's nurse that his pain is well controlled and he would like to go home tonight. He is voiding without difficulty. No c/o. Gave option to D/C tonight or wait until AM rounds. Pt requesting D/C tonight. Rx's on chart. Order placed.

## 2013-02-25 NOTE — Progress Notes (Signed)
Orders noted for sleep apnea precautions written per Dr. Council Mechanic; pt placed on ETCO2 monitoring

## 2013-02-25 NOTE — Anesthesia Preprocedure Evaluation (Addendum)
Anesthesia Evaluation  Patient identified by MRN, date of birth, ID band Patient awake  General Assessment Comment:.  Hypertension     .  High cholesterol     .  Anginal pain         2010   .  GERD (gastroesophageal reflux disease)         hx of   .  Arthritis     .  PONV (postoperative nausea and vomiting)     .  Sleep apnea     Reviewed: Allergy & Precautions, H&P , NPO status , Patient's Chart, lab work & pertinent test results  History of Anesthesia Complications (+) PONV  Airway Mallampati: II TM Distance: >3 FB Neck ROM: Full    Dental no notable dental hx.    Pulmonary sleep apnea and Continuous Positive Airway Pressure Ventilation , Current Smoker,  breath sounds clear to auscultation  Pulmonary exam normal       Cardiovascular Exercise Tolerance: Good hypertension, + angina negative cardio ROS  Rhythm:Regular Rate:Normal  One episode of chest pain in 2010. Negative stress test 12-22-08 in [place]. ECG 08-08-12 reviewed. Anterior Q waves.  Denies current cardiac symptoms. Not told he needed HTN medicines.  BP 136/88 today.   Neuro/Psych negative neurological ROS  negative psych ROS   GI/Hepatic Neg liver ROS, GERD-  ,  Endo/Other  negative endocrine ROS  Renal/GU negative Renal ROS  negative genitourinary   Musculoskeletal negative musculoskeletal ROS (+)   Abdominal   Peds negative pediatric ROS (+)  Hematology negative hematology ROS (+)   Anesthesia Other Findings   Reproductive/Obstetrics negative OB ROS                        Anesthesia Physical Anesthesia Plan  ASA: II  Anesthesia Plan: General   Post-op Pain Management:    Induction: Intravenous  Airway Management Planned: Oral ETT  Additional Equipment:   Intra-op Plan:   Post-operative Plan: Extubation in OR  Informed Consent: I have reviewed the patients History and Physical, chart, labs and discussed  the procedure including the risks, benefits and alternatives for the proposed anesthesia with the patient or authorized representative who has indicated his/her understanding and acceptance.   Dental advisory given  Plan Discussed with: CRNA  Anesthesia Plan Comments:        Anesthesia Quick Evaluation

## 2013-02-25 NOTE — Interval H&P Note (Signed)
History and Physical Interval Note:  02/25/2013 8:24 AM  Mark Phillips  has presented today for surgery, with the diagnosis of Right Rotator Cuff Tear  The various methods of treatment have been discussed with the patient and family. After consideration of risks, benefits and other options for treatment, the patient has consented to  Procedure(s): RIGHT SHOULDER ARTHROSCOPY WITH SUBACROMIAL DECOMPRESSION AND MINI ROTATOR CUFF REPAIR (Right) as a surgical intervention .  The patient's history has been reviewed, patient examined, no change in status, stable for surgery.  I have reviewed the patient's chart and labs.  Questions were answered to the patient's satisfaction.     [name]

## 2013-02-25 NOTE — Anesthesia Postprocedure Evaluation (Signed)
  Anesthesia Post-op Note  Patient: Mark Phillips  Procedure(s) Performed: Procedure(s) (LRB): RIGHT SHOULDER ARTHROSCOPY WITH SUBACROMIAL DECOMPRESSION AND MINI OPEN ROTATOR CUFF REPAIR (Right)  Patient Location: PACU  Anesthesia Type: General  Level of Consciousness: awake and alert   Airway and Oxygen Therapy: Patient Spontanous Breathing  Post-op Pain: mild  Post-op Assessment: Post-op Vital signs reviewed, Patient's Cardiovascular Status Stable, Respiratory Function Stable, Patent Airway and No signs of Nausea or vomiting  Last Vitals:  Filed Vitals:   02/25/13 1130  BP: 122/59  Pulse: 66  Temp: 36.8 C  Resp: 24    Post-op Vital Signs: stable   Complications: No apparent anesthesia complications

## 2013-02-25 NOTE — H&P (View-Only) (Signed)
Mark Phillips is an 42 y.o. Phillips.   Chief Complaint: right shoulder pain HPI: Mark Phillips c/o R shoulder pain x 1 yr duration which initially started without injury. 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 Mark Phillips was having daily pain in the R shoulder which worsened with increased use. Mark Phillips also sleeps with his R arm overhead. 4 weeks ago ago Mark Phillips attempted to move a large rock - lifted it in front of him, then felt a sudden pain in the R shoulder into the R Arm. Mark Phillips initially was unable to even move the arm at all, it was so painful, but that has improved slightly. Mark Phillips notes occasional numbness and tingling into the arm, to the elbow and occasionally to the forearm. Notes some chronic neck stiffness. Was seen in the ER where xrays were taken and Mark Phillips was given Rx for pain medication and NSAIDs which are not helpful. Does note some weakness in the R shoulder. Subacromial steroid injection with worsening pain. Refractory to HEP.  Past Medical History  Diagnosis Date  . Hypertension   . High cholesterol   . Anginal pain     2010  . GERD (gastroesophageal reflux disease)     hx of  . Arthritis   . PONV (postoperative nausea and vomiting)   . Sleep apnea     Past Surgical History  Procedure Laterality Date  . Colon surgery    . Ankle reconstruction      lt reconstruction and rt scope sx  . Appendectomy  2000  . Wisdom tooth extraction  2011    Family History  Problem Relation Age of Onset  . Heart disease    . Arthritis    . Lung disease    . Cancer    . Asthma    . Diabetes     Social History:  reports that Mark Phillips has been smoking Cigarettes.  Mark Phillips has a 19 pack-year smoking history. Mark Phillips quit smokeless tobacco use about 19 years ago. His smokeless tobacco use included Chew. Mark Phillips reports that Mark Phillips does not drink alcohol or use illicit drugs.  Allergies:  Allergies  Allergen Reactions  .  Albuterol Swelling     (Not in a hospital admission)  No results found for this or any previous visit (from the past 48 hour(s)). No results found.  Review of Systems  Constitutional: Negative.   HENT: Negative.   Eyes: Negative.   Respiratory: Negative.   Cardiovascular: Negative.   Gastrointestinal: Negative.   Genitourinary: Negative.   Musculoskeletal: Positive for joint pain.  Skin: Negative.   Neurological: Negative.   Endo/Heme/Allergies: Negative.   Psychiatric/Behavioral: Negative.     There were no vitals taken for this visit. Physical Exam  Constitutional: Mark Phillips is oriented to person, place, and time. Mark Phillips appears well-developed and well-nourished.  HENT:  Head: Normocephalic and atraumatic.  Eyes: Conjunctivae and EOM are normal. Pupils are equal, round, and reactive to light.  Neck: Normal range of motion. Neck supple.  Cardiovascular: Normal rate and regular rhythm.   Respiratory: Effort normal and breath sounds normal.  GI: Soft. Bowel sounds are normal.  Musculoskeletal:  On exam positive impingement signs, positive secondary impingement sign of the shoulder. Nontender over the AC.  Inspection of the shoulder revealed no ecchymosis, soft tissue swelling, or deformity. On palpation, nontender in the subacromial region. On range of motion the   patient had full range of motion. Provocative signs indicated no sulcus sign, negative speed's test. Negative lift off. Sensory exam was intact and motor function was normal in the deltoid and the rotator cuff.   Neurological: Mark Phillips is alert and oriented to person, place, and time. Mark Phillips has normal reflexes.  Skin: Skin is warm and dry.  Psychiatric: Mark Phillips has a normal mood and affect.   R shoulder xrays from [facility] system reviewed with no fx, subluxation, dislocation, lytic or blastic lesions. Mild AC joint degenerative changes. Type 2 acromion. Decreased subacromial space.  MRI demonstrates full thickness supraspinatus tear,  1-2 centimeters of retraction.   Assessment/Plan Right shoulder RCT Full thickness rotator cuff tear, supraspinatus, mild retraction.  1. Discussed options to proceed with arthroscopic assisted rotator cuff repair.  I had a long discussion with the patient concerning the risks and benefits of the proposed shoulder surgery including need for rotator cuff repair, infection, suboptimal range of motion, adhesive capsulitis, and recurrent tear requiring further surgery. We also discussed the extended recovery requirement for postoperative physical therapy and the time to maximum recovery. I provided the patient with an illustrated handout and discussed that in detail. We also discussed anesthetic complications, DVT, PE, cardiopulmonary dysfunction, etc.  2. Mark Phillips had some issues with his AC joint in the past, discussed distal clavicle resection. Obtained two view radiographs today, Mark Phillips does not have significant severe AC arthrosis and we will put that as a possibility for DCR.  Plan right shoulder arthroscopy, SAD, mini-open RCR, possible DCR  Abundio Teuscher M. for Dr. Beane 02/23/2013, 1:49 PM    

## 2013-02-25 NOTE — Brief Op Note (Signed)
02/25/2013  10:11 AM  PATIENT:  Mark Phillips  42 y.o. male  PRE-OPERATIVE DIAGNOSIS:  Right Rotator Cuff Tear  POST-OPERATIVE DIAGNOSIS:  Right Rotator Cuff Tear  PROCEDURE:  Procedure(s): RIGHT SHOULDER ARTHROSCOPY WITH SUBACROMIAL DECOMPRESSION AND MINI OPEN ROTATOR CUFF REPAIR (Right)  SURGEON:  Surgeon(s) and Role:    * Javier Docker, MD - Primary  PHYSICIAN ASSISTANT:   ASSISTANTS: Bissell   ANESTHESIA:   general  EBL:  Total I/O In: 1000 [I.V.:1000] Out: 50 [Blood:50]  BLOOD ADMINISTERED:none  DRAINS: none   LOCAL MEDICATIONS USED:  MARCAINE     SPECIMEN:  No Specimen  DISPOSITION OF SPECIMEN:  N/A  COUNTS:  YES  TOURNIQUET:  * No tourniquets in log *  DICTATION: .Other Dictation: Dictation Number [id number]  PLAN OF CARE: Admit for overnight observation  PATIENT DISPOSITION:  PACU - hemodynamically stable.   Delay start of Pharmacological VTE agent (>24hrs) due to surgical blood loss or risk of bleeding: no

## 2013-02-25 NOTE — Discharge Summary (Signed)
Physician Discharge Summary   Patient ID: Mark Phillips MRN: [id number] DOB/AGE: [birth year] 42 y.o.  Admit date: 02/25/2013 Discharge date: 02/25/2013  Primary Diagnosis:   Right Rotator Cuff Tear  Admission Diagnoses:  Past Medical History  Diagnosis Date  . Hypertension   . High cholesterol   . Anginal pain     2010  . GERD (gastroesophageal reflux disease)     hx of  . Arthritis   . PONV (postoperative nausea and vomiting)   . Sleep apnea    Discharge Diagnoses:   Principal Problem:   Right rotator cuff tear  Procedure:  Procedure(s) (LRB): RIGHT SHOULDER ARTHROSCOPY WITH SUBACROMIAL DECOMPRESSION AND MINI OPEN ROTATOR CUFF REPAIR (Right)   Consults: None  HPI:  see H&P    Laboratory Data: Hospital Outpatient Visit on 02/19/2013  Component Date Value Range Status  . WBC 02/19/2013 10.1  4.0 - 10.5 K/uL Final  . RBC 02/19/2013 5.33  4.22 - 5.81 MIL/uL Final  . Hemoglobin 02/19/2013 15.8  13.0 - 17.0 g/dL Final  . HCT 40/98/1191 46.4  39.0 - 52.0 % Final  . MCV 02/19/2013 87.1  78.0 - 100.0 fL Final  . MCH 02/19/2013 29.6  26.0 - 34.0 pg Final  . MCHC 02/19/2013 34.1  30.0 - 36.0 g/dL Final  . RDW 47/82/9562 12.9  11.5 - 15.5 % Final  . Platelets 02/19/2013 178  150 - 400 K/uL Final  . MRSA, PCR 02/19/2013 NEGATIVE  NEGATIVE Final  . Staphylococcus aureus 02/19/2013 NEGATIVE  NEGATIVE Final   Comment:                                 The Xpert SA Assay (FDA                          approved for NASAL specimens                          in patients over 73 years of age),                          is one component of                          a comprehensive surveillance                          program.  Test performance has                          been validated by [employer] for patients greater                          than or equal to 7 year old.                          It is not intended                          to diagnose  infection nor  to                          guide or monitor treatment.    Recent Labs  02/25/13 1412  HGB 14.4    Recent Labs  02/25/13 1412  WBC 16.9*  RBC 4.79  HCT 41.6  PLT 144*    Recent Labs  02/25/13 1412  CREATININE 0.83   No results found for this basename: LABPT, INR,  in the last 72 hours  X-Rays:No results found.  EKG: Orders placed during the hospital encounter of 08/08/12  . EKG 12-LEAD  . EKG 12-LEAD  . ED EKG  . ED EKG  . EKG     Hospital Course: Patient was admitted to [facility] and taken to the OR and underwent the above state procedure without complications.  Patient tolerated the procedure well and was later transferred to the recovery room and then to the orthopaedic floor for postoperative care.  They were given PO and IV analgesics for pain control following their surgery.  They were given postoperative antibiotics. Patient had good pain control following surgery and was ready for D/C same day of surgery. They were given discharge instructions and dressing directions.  They were instructed on when to follow up in the office with Dr. Shelle Iron.  Discharge Medications: Prior to Admission medications   Medication Sig Start Date End Date Taking? Authorizing Provider  ibuprofen (ADVIL,MOTRIN) 200 MG tablet Take 600 mg by mouth every 8 (eight) hours as needed. For pain    Historical Provider, MD  methocarbamol (ROBAXIN) 500 MG tablet Take 1 tablet (500 mg total) by mouth 3 (three) times daily. 02/25/13   Javier Docker, MD  oxyCODONE-acetaminophen (PERCOCET) 7.5-325 MG per tablet Take 1-2 tablets by mouth every 4 (four) hours as needed for pain. 02/25/13   Javier Docker, MD    Diet: Regular diet Activity:WBAT Follow-up:in 10-14 days Disposition - Home Discharged Condition: good   Discharge Orders   Future Orders Complete By Expires     Call MD / Call 911  As directed     Comments:      If you experience chest pain or shortness of  breath, CALL 911 and be transported to the hospital emergency room.  If you develope a fever above 101 F, pus (white drainage) or increased drainage or redness at the wound, or calf pain, call your surgeon's office.    Constipation Prevention  As directed     Comments:      Drink plenty of fluids.  Prune juice may be helpful.  You may use a stool softener, such as Colace (over the counter) 100 mg twice a day.  Use MiraLax (over the counter) for constipation as needed.    Diet - low sodium heart healthy  As directed     Increase activity slowly as tolerated  As directed         Medication List    STOP taking these medications       oxyCODONE-acetaminophen 5-325 MG per tablet  Commonly known as:  PERCOCET/ROXICET      TAKE these medications       ibuprofen 200 MG tablet  Commonly known as:  ADVIL,MOTRIN  Take 600 mg by mouth every 8 (eight) hours as needed. For pain     methocarbamol 500 MG tablet  Commonly known as:  ROBAXIN  Take 1 tablet (500 mg total) by mouth 3 (three) times daily.  oxyCODONE-acetaminophen 7.5-325 MG per tablet  Commonly known as:  PERCOCET  Take 1-2 tablets by mouth every 4 (four) hours as needed for pain.           Follow-up Information   Follow up with BEANE,JEFFREY C, MD In 10 days.   Contact information:   [address] [place] [address] [phone]       Signed: Dorothy Spark. 02/25/2013, 10:13 PM

## 2013-02-26 ENCOUNTER — Encounter (HOSPITAL_COMMUNITY): Payer: Self-pay | Admitting: Specialist

## 2013-02-26 NOTE — Op Note (Signed)
NAMEITZEL, LOWRIMORE               ACCOUNT NO.:  [id number]  MEDICAL RECORD NO.:  [id number]  LOCATION:  1611                         FACILITY:  [facility]  PHYSICIAN:  Jene Every, M.D.    DATE OF BIRTH:  June 26, 1971  DATE OF PROCEDURE:  02/25/2013 DATE OF DISCHARGE:  02/25/2013                              OPERATIVE REPORT   PREOPERATIVE DIAGNOSES:  Right rotator cuff tear.  Impingement syndrome.  POSTOPERATIVE DIAGNOSES:  Right rotator cuff tear.  Impingement syndrome.  PROCEDURES PERFORMED: 1. Right shoulder arthroscopy. 2. Subacromial decompression. 3. Mini open rotator cuff repair, CA ligament resection and     acromioplasty.  ANESTHESIA:  General.  ASSISTANT:  Lanna Poche, PA-C  HISTORY:  A 42 year old with full-thickness tear, partially retracted rotator cuff, indicated for repair, arthroscopic assisted.  Risks and benefits were discussed including bleeding, infection, suboptimal range of motion, recurrent tear, DVT, PE, anesthetic complications, etc.  TECHNIQUE:  With the patient in supine beach-chair position after induction of adequate general anesthesia, 2 g of Kefzol, 900 mg of clindamycin and the right shoulder and upper extremity was prepped and draped in usual sterile fashion.  He was given clindamycin as he has had previous MRSA infection.  Surgical marker was utilized below the anterior acromion and AC joint coracoid.  Standard posterolateral and anterolateral portals were utilizing with incision through the skin only.  The arm was placed in 30-degree position and advanced the arthroscopic camera in the glenohumeral joint penetrating the capsule atraumatically.  Irrigant was utilized to insufflate the joint. Inspection revealed tear of the rotator cuff.  Glenohumeral joint was unremarkable, no torn labrum and unremarkable attachment of the biceps. We introduced the 18-gauge needle anteriorly into the glenohumeral joint probing the labrum, again no  tear.  Subscap was unremarkable as well.  I then redirected the camera in the subacromial space and introduced in the anterolateral portal after incision through the skin only, triangulating the subacromial space.  Hypertrophic bursa was noted.  I introduced a shaver and performed a bursectomy, released the CA ligament, and saved the anterolateral aspect of the acromion.  Full- thickness tear was identified.  I then removed all instrumentation, converted into a mini open procedure with a small 2-cm incision over the anterolateral aspect of the acromion.  Subcutaneous tissue was dissected.  Electrocautery was utilized to achieve hemostasis.  The raphe between the anterolateral heads was identified and divided in line with skin incision.  Self-retaining retractor was placed.  The subacromial space was identified.  Residual bursa excised, full- thickness tear noted.  Debrided the edges, fashioned a trough lateral to the articular surface.  Good bleeding bone.  Placed two Mitek suture anchors approximately 1.5 cm apart.  That was both through the tendon back in the posterior and then the anterior portion of the supraspinatus, abducted the arm, advanced into the trough, tied it with a good surgical knot.  Crossed those and then over the edge of the greater tuberosity, and secured then with two PushLock anchors with the appropriate tensioning.  I then oversewed posteriorly and anteriorly with #1 Vicryl interrupted figure-of-8 sutures for watertight closure. The remainder of the cuff was unremarkable and good subacromial decompression was  noted.  Copiously irrigated the wound.  We repaired the raphe with 1 Vicryl subcu with 2, skin with Prolene, reinforced with Steri-Strips.  Sterile dressing applied.  Abduction pillow placed. Extubated without difficulty and transported to the recovery room in satisfactory condition.  The patient tolerated the procedure well.  No complications.   Assistant, Lanna Poche, [place].  Minimal blood loss.     Jene Every, M.D.     Cordelia Pen  D:  02/25/2013  T:  02/26/2013  Job:  [id number]

## 2013-04-08 ENCOUNTER — Ambulatory Visit (HOSPITAL_COMMUNITY)
Admission: RE | Admit: 2013-04-08 | Discharge: 2013-04-08 | Disposition: A | Discharge status: Home / Self Care | Payer: Managed Care, Other (non HMO) | Source: Ambulatory Visit | Attending: Specialist | Admitting: Specialist

## 2013-04-08 DIAGNOSIS — IMO0001 Reserved for inherently not codable concepts without codable children: Secondary | ICD-10-CM | POA: Insufficient documentation

## 2013-04-08 DIAGNOSIS — M25519 Pain in unspecified shoulder: Secondary | ICD-10-CM | POA: Insufficient documentation

## 2013-04-08 DIAGNOSIS — I10 Essential (primary) hypertension: Secondary | ICD-10-CM | POA: Insufficient documentation

## 2013-04-08 DIAGNOSIS — M6281 Muscle weakness (generalized): Secondary | ICD-10-CM | POA: Insufficient documentation

## 2013-04-08 NOTE — Evaluation (Signed)
Occupational Therapy Evaluation  Patient Details  Name: Mark Phillips MRN: [id number] Date of Birth: 19-Apr-1971  Today's Date: 04/08/2013 Time: 6295-2841 OT Time Calculation (min): 35 min OT eval 3244-0102 35'  Visit#: 1 of 24  Re-eval: 05/06/13  Assessment Diagnosis: Right shoulder rotator cuff repair Surgical Date: 02/25/13 Next MD Visit: 04/15/13 Shelle Iron Prior Therapy: [facility] outpatient for neck pain in 2012  Authorization:    Authorization Time Period:    Authorization Visit#:   of     Past Medical History:  Past Medical History  Diagnosis Date  . Hypertension   . High cholesterol   . Anginal pain     2010  . GERD (gastroesophageal reflux disease)     hx of  . Arthritis   . PONV (postoperative nausea and vomiting)   . Sleep apnea    Past Surgical History:  Past Surgical History  Procedure Laterality Date  . Colon surgery    . Ankle reconstruction      lt reconstruction and rt scope sx  . Appendectomy  2000  . Wisdom tooth extraction  2011  . Shoulder arthroscopy with subacromial decompression and open rotator c Right 02/25/2013    Procedure: RIGHT SHOULDER ARTHROSCOPY WITH SUBACROMIAL DECOMPRESSION AND MINI OPEN ROTATOR CUFF REPAIR;  Surgeon: Javier Docker, MD;  Location: [facility];  Service: Orthopedics;  Laterality: Right;    Subjective Symptoms/Limitations Symptoms: S: I am allowed to take my sling off and use my arm gently for bathing and eating.  Pertinent History: Mark Phillips Right shoulder had been hurting for a 1.5 years. Recently was working outside and lifted a large rock and felt something tear. Mark Phillips sustained a rotator cuff tear and underwent rotator cuff repear sx on 02/25/13. Dr. Shelle Iron has referred patient to occupational therapy for evaluation and treatment.   Special Tests: DASH score: 59.0 with ideal score of 0. Patient Stated Goals: To use arm as normal as before. Pain Assessment Currently in Pain?: No/denies  Precautions/Restrictions   Precautions Precautions: Shoulder Type of Shoulder Precautions: Week 1-4 (7/3-7/31) Pendulum exercises, Supine PROM to tolerance, Wrist and elbow AROM, isometric supine. Weeks 5-8 (8/7-8/28) Progress PROM to full range, pulleys, may begin AAROM when patient has full pain free arc. Discontinue sling. Weeks 9-12 (9/4-9/25) Progress to AROM, light resistive exercises to tolerance, Theraband, caution unsupported lifting at home. Weeks 13-16 (10/2-10/23) Progress resistive strengthening to tolerance, scapular stabilizing exercise. Required Braces or Orthoses: Sling (for comfort. May come off for bathing and eating)  Balance Screening Balance Screen Has the patient fallen in the past 6 months: No  Prior Functioning  Home Living Family/patient expects to be discharged to:: Private residence Living Arrangements: Spouse/significant other;Children Prior Function Level of Independence: Independent with basic ADLs;Independent with gait  Able to Take Stairs?: Yes Driving: Yes Vocation: Full time employment Vocation Requirements: Production designer, theatre/television/film at [employer] Leisure: Hobbies-yes (Comment) Comments: fishing, camping, outdoor activities  Assessment ADL/Vision/Perception ADL ADL Comments: Patient has difficulty cooking, donning shirt, and at times, and at this time is unable to reach his arm overhead. Dominant Hand: Right  Cognition/Observation Cognition Overall Cognitive Status: Within Functional Limits for tasks assessed Arousal/Alertness: Awake/alert Orientation Level: Oriented X4   Additional Assessments RUE Assessment RUE Assessment:  (assessed supine. IR/ER ABD) RUE PROM (degrees) Right Shoulder Flexion: 106 Degrees Right Shoulder ABduction: 86 Degrees Right Shoulder Internal Rotation: 90 Degrees Right Shoulder External Rotation: 35 Degrees RUE Strength RUE Overall Strength Comments: Not assessed due to protocol. Palpation Palpation: max fascial restrictions in  right upper arm,  trapezius, and scapularis region.      Exercise/Treatments     Manual Therapy Manual Therapy: Myofascial release Myofascial Release: MFR and manual stretching to right upper arm, trapezius, and scapularis region to decrease fascial restrictions and increase joint mobility in a pain free zone.   Occupational Therapy Assessment and Plan OT Assessment and Plan Clinical Impression Statement: Patient is a 42 y/o male s/p Right rotator cuff repair presenting to outpatient occupational therapy with deficits below. Patient will benefit from outpatient OT services to increase AROM and strength of right shoulder, decrease pain, and return to highest level of independence with all daily activities.  Pt will benefit from skilled therapeutic intervention in order to improve on the following deficits: Impaired UE functional use;Increased fascial restricitons;Decreased range of motion;Pain;Decreased strength Rehab Potential: Excellent OT Frequency: Min 2X/week OT Duration: 12 weeks OT Treatment/Interventions: Therapeutic activities;Self-care/ADL training;Therapeutic exercise;Manual therapy;Modalities;DME and/or AE instruction;Patient/family education OT Plan: P: Follow Protocol,  MFR and manual stretching in supine.  Therapeutic Exercises:  PROM, progress to [facility] ball stretches, shoulder shrugs, isometrics, scapular stability, elv and retraction with weight, supine serratus punch with weight, tband IR.ER, extension, retraction,  thumb tacks.   Goals Short Term Goals Time to Complete Short Term Goals: 6 weeks Short Term Goal 1: Patient will be educated on HEP.  Short Term Goal 2: Patient will increase right shoulder PROM bto WFL for for greater ease with donning and doffing shirt. Short Term Goal 3: Patient will increase his right shoulder strength to 3+/5 for increased ability to lift pots and pans when cooking. Short Term Goal 4: Patient will decrease fascial restrictions from moderate-max to moderate  for greater mobility needed for personal hygiene completion. Short Term Goal 5: Patient will decrease pain to 3/10 in his right shoulder for increased abilty to sleep at night. Long Term Goals Time to Complete Long Term Goals: 12 weeks Long Term Goal 1: Patient will return to highest level of independence for work, leisure, and daily activies.  Long Term Goal 2: Patient will increase rightt shoulder AROM to WNL for increased abilty to cast fishing rod with more ease. Long Term Goal 3: Patient will increase right shoulder strength to 5/5 for increased ability to lift pots and pans when cooking.  Long Term Goal 4: Patient will decrease fascial restrictions to minimal in his rightt shoulder region for greater mobility required to raise arm up to reach into overhead cabinets. Long Term Goal 5: Patient will decrease pain in his right shoulder region to 1/10 when completing desired daily activities.   Problem List Patient Active Problem List   Diagnosis Date Noted  . Pain in joint, shoulder region 04/08/2013  . Muscle weakness (generalized) 04/08/2013  . Right rotator cuff tear 02/25/2013  . Gout 08/27/2012  . Foot infection 08/27/2012  . Achilles bursitis or tendinitis 08/27/2012  . Cervicalgia 06/24/2011  . Cervical disc disorder with radiculopathy 06/12/2011    End of Session Activity Tolerance: Patient tolerated treatment well General Behavior During Therapy: [facility] for tasks assessed/performed OT Plan of Care OT Home Exercise Plan: towel slides and pendulum exercises OT Patient Instructions: handout - scanned Consulted and Agree with Plan of Care: Patient   Limmie Patricia, OTR/L,CBIS   04/08/2013, 4:15 PM  Physician Documentation Your signature is required to indicate approval of the treatment plan as stated above.  Please sign and either send electronically or make a copy of this report for your files and return this physician signed original.  Please mark one 1.__approve of plan   2. ___approve of plan with the following conditions.   ______________________________                                                          _____________________ Physician Signature                                                                                                             Date

## 2013-04-13 ENCOUNTER — Ambulatory Visit (HOSPITAL_COMMUNITY)
Admission: RE | Admit: 2013-04-13 | Discharge: 2013-04-13 | Disposition: A | Discharge status: Home / Self Care | Payer: Managed Care, Other (non HMO) | Source: Ambulatory Visit | Attending: Specialist | Admitting: Specialist

## 2013-04-13 DIAGNOSIS — M6281 Muscle weakness (generalized): Secondary | ICD-10-CM

## 2013-04-13 DIAGNOSIS — M75101 Unspecified rotator cuff tear or rupture of right shoulder, not specified as traumatic: Secondary | ICD-10-CM

## 2013-04-13 DIAGNOSIS — M25511 Pain in right shoulder: Secondary | ICD-10-CM

## 2013-04-13 NOTE — Progress Notes (Signed)
Occupational Therapy Treatment Patient Details  Name: Mark Phillips MRN: [id number] Date of Birth: 1971/10/02  Today's Date: 04/13/2013 Time: 4098-1191 OT Time Calculation (min): 35 min Manual therapy 4782-9562 15' Therapeutic exercises 1308-6578 20' Visit#: 2 of 24  Re-eval: 05/06/13    Subjective Symptoms/Limitations Symptoms: S:  I have been taking the sling off a bit more.  Limitations: Week 1-4 (5/22-619) Pendulum exercises, Supine PROM to tolerance, Wrist and elbow AROM, isometric supine. Weeks 5-8 (6/19-7/10) Progress PROM to full range, pulleys, may begin AAROM when patient has full pain free arc. Discontinue sling. Weeks 9-12 (7/10-7/31) Progress to AROM, light resistive exercises to tolerance, Theraband, caution unsupported lifting at home. Weeks 13-16 (10/2-10/23) Progress resistive strengthening to tolerance, scapular stabilizing exercise. Pain Assessment Currently in Pain?: No/denies  Precautions/Restrictions    Week 1-4 (5/22-619) Pendulum exercises, Supine PROM to tolerance, Wrist and elbow AROM, isometric supine. Weeks 5-8 (6/19-7/10) Progress PROM to full range, pulleys, may begin AAROM when patient has full pain free arc. Discontinue sling. Weeks 9-12 (7/10-7/31) Progress to AROM, light resistive exercises to tolerance, Theraband, caution unsupported lifting at home. Weeks 13-16 (10/2-10/23) Progress resistive strengthening to tolerance, scapular stabilizing exercise.   Exercise/Treatments Supine Protraction: PROM;10 reps Horizontal ABduction: PROM;10 reps External Rotation: PROM;10 reps Internal Rotation: PROM;10 reps Flexion: PROM;10 reps ABduction: PROM;10 reps Other Supine Exercises: bridging x 15  Seated Elevation: AROM;15 reps Extension: AROM;15 reps Row: AROM;15 reps Therapy Ball Flexion: 20 reps ABduction: 20 reps Isometric strengthening 5 x 3" each all shoulder ranges in supine.     Manual Therapy Manual Therapy: Myofascial release Myofascial  Release: MFR and manual stretching to right upper arm, trapezius, and scapularis region to decrease fascial restrictions and increase joint mobility in a pain free zone.   Occupational Therapy Assessment and Plan OT Assessment and Plan Clinical Impression Statement: A:  Patient limited with PROM, specifically flexion in supine to approximately 90.   OT Plan: P:  Follow protocol, increase contraction time with isometric exercises and improve PROM in supine to [facility].     Goals Short Term Goals Time to Complete Short Term Goals: 6 weeks Short Term Goal 1: Patient will be educated on HEP.  Short Term Goal 1 Progress: Progressing toward goal Short Term Goal 2: Patient will increase right shoulder PROM bto WFL for for greater ease with donning and doffing shirt. Short Term Goal 2 Progress: Progressing toward goal Short Term Goal 3: Patient will increase his right shoulder strength to 3+/5 for increased ability to lift pots and pans when cooking. Short Term Goal 3 Progress: Progressing toward goal Short Term Goal 4: Patient will decrease fascial restrictions from moderate-max to moderate for greater mobility needed for personal hygiene completion. Short Term Goal 4 Progress: Progressing toward goal Short Term Goal 5: Patient will decrease pain to 3/10 in his right shoulder for increased abilty to sleep at night. Short Term Goal 5 Progress: Progressing toward goal Long Term Goals Time to Complete Long Term Goals: 12 weeks Long Term Goal 1: Patient will return to highest level of independence for work, leisure, and daily activies.  Long Term Goal 1 Progress: Progressing toward goal Long Term Goal 2: Patient will increase rightt shoulder AROM to WNL for increased abilty to cast fishing rod with more ease. Long Term Goal 2 Progress: Progressing toward goal Long Term Goal 3: Patient will increase right shoulder strength to 5/5 for increased ability to lift pots and pans when cooking.  Long Term Goal 3  Progress: Progressing  toward goal Long Term Goal 4: Patient will decrease fascial restrictions to minimal in his rightt shoulder region for greater mobility required to raise arm up to reach into overhead cabinets. Long Term Goal 4 Progress: Progressing toward goal Long Term Goal 5: Patient will decrease pain in his right shoulder region to 1/10 when completing desired daily activities.  Long Term Goal 5 Progress: Progressing toward goal  Problem List Patient Active Problem List   Diagnosis Date Noted  . Pain in joint, shoulder region 04/08/2013  . Muscle weakness (generalized) 04/08/2013  . Right rotator cuff tear 02/25/2013  . Gout 08/27/2012  . Foot infection 08/27/2012  . Achilles bursitis or tendinitis 08/27/2012  . Cervicalgia 06/24/2011  . Cervical disc disorder with radiculopathy 06/12/2011    End of Session Activity Tolerance: Patient tolerated treatment well General Behavior During Therapy: [facility] for tasks assessed/performed  GO    Shirlean Mylar, OTR/L  04/13/2013, 11:59 AM

## 2013-04-15 ENCOUNTER — Ambulatory Visit (HOSPITAL_COMMUNITY): Payer: Managed Care, Other (non HMO)

## 2013-04-16 ENCOUNTER — Ambulatory Visit (HOSPITAL_COMMUNITY)
Admission: RE | Admit: 2013-04-16 | Discharge: 2013-04-16 | Disposition: A | Discharge status: Home / Self Care | Payer: Managed Care, Other (non HMO) | Source: Ambulatory Visit | Attending: Specialist | Admitting: Specialist

## 2013-04-16 NOTE — Progress Notes (Signed)
Occupational Therapy Treatment Patient Details  Name: Mark Phillips MRN: [id number] Date of Birth: June 17, 1971  Today's Date: 04/16/2013 Time: 4401-0272 OT Time Calculation (min): 45 min Manual Therapy 5366-4403 15' Therapeutic Exercises 1320-1350 30' Visit#: 3 of 24  Re-eval: 05/06/13    Subjective  S:  The MD is pleased with my progress.  I dont have to wear the sling anymore.  Limitations: Week 1-4 (5/22-619) Pendulum exercises, Supine PROM to tolerance, Wrist and elbow AROM, isometric supine. Weeks 5-8 (6/19-7/10) Progress PROM to full range, pulleys, may begin AAROM when patient has full pain free arc. Discontinue sling. Weeks 9-12 (7/10-7/31) Progress to AROM, light resistive exercises to tolerance, Theraband, caution unsupported lifting at home. Weeks 13-16 (10/2-10/23) Progress resistive strengthening to tolerance, scapular stabilizing exercise. Pain Assessment Currently in Pain?: No/denies  Precautions/Restrictions    Week 1-4 (5/22-619) Pendulum exercises, Supine PROM to tolerance, Wrist and elbow AROM, isometric supine. Weeks 5-8 (6/19-7/10) Progress PROM to full range, pulleys, may begin AAROM when patient has full pain free arc. Discontinue sling. Weeks 9-12 (7/10-7/31) Progress to AROM, light resistive exercises to tolerance, Theraband, caution unsupported lifting at home. Weeks 13-16 (10/2-10/23) Progress resistive strengthening to tolerance, scapular stabilizing exercise. , Exercise/Treatments Supine Protraction: PROM;AAROM;10 reps Horizontal ABduction: PROM;AAROM;10 reps External Rotation: PROM;AAROM;10 reps;Limitations External Rotation Limitations: vg and min tactile cues to keep upper arm adducted Internal Rotation: PROM;AAROM;10 reps Flexion: PROM;AAROM;10 reps ABduction: PROM;AAROM;10 reps;Limitations ABduction Limitations: AAROM limited to 90 and OTR/L facilitated by unweighting arm.  Other Supine Exercises: bridging x 25 Seated Elevation: AROM;15  reps Extension: AROM;15 reps Row: AROM;15 reps Therapy Ball Flexion: 20 reps ABduction: 20 reps ROM / Strengthening / Isometric Strengthening Thumb Tacks: 1' Prot/Ret//Elev/Dep: 1' Flexion: Supine;5X5" Extension: Supine;5X5" External Rotation: Supine;5X5" Internal Rotation: Supine;5X5" ABduction: Supine;5X5" ADduction: Supine;5X5"    Manual Therapy Myofascial Release: MFR and manual stretching to right upper arm, trapezius, SCM, scapular region and shoulder region to decrease fascial restrictions and increase pain free mobility.   Occupational Therapy Assessment and Plan OT Assessment and Plan Clinical Impression Statement: A:  increased PROM this date, to 75% with all ranges.  Added AAROM in supine, with assistance needed in abduction. OT Plan: P:  Follow protocol, attempt AAROM abduction without assistance from OT, add pulleys.    Goals    Problem List Patient Active Problem List   Diagnosis Date Noted  . Pain in joint, shoulder region 04/08/2013  . Muscle weakness (generalized) 04/08/2013  . Right rotator cuff tear 02/25/2013  . Gout 08/27/2012  . Foot infection 08/27/2012  . Achilles bursitis or tendinitis 08/27/2012  . Cervicalgia 06/24/2011  . Cervical disc disorder with radiculopathy 06/12/2011    End of Session Activity Tolerance: Patient tolerated treatment well General Behavior During Therapy: [employer] for tasks assessed/performed OT Plan of Care OT Home Exercise Plan: issue dowel rod exercises at next visit., educated on ER stretch with dowel rod today.  Shirlean Mylar, OTR/L  04/16/2013, 1:54 PM

## 2013-04-20 ENCOUNTER — Ambulatory Visit (HOSPITAL_COMMUNITY)
Admission: RE | Admit: 2013-04-20 | Discharge: 2013-04-20 | Disposition: A | Discharge status: Home / Self Care | Payer: Managed Care, Other (non HMO) | Source: Ambulatory Visit | Attending: Specialist | Admitting: Specialist

## 2013-04-20 NOTE — Progress Notes (Signed)
Occupational Therapy Treatment Patient Details  Name: Mark Phillips MRN: [id number] Date of Birth: [birth year]  Today's Date: 04/20/2013 Time: 1020-1100 OT Time Calculation (min): 40 min MFR 1020-1030 10' Therex 1030-1100 30'  Visit#: 4 of 24  Re-eval: 05/06/13    Authorization:    Authorization Time Period:    Authorization Visit#:   of    Subjective Symptoms/Limitations Symptoms: S: My arm feels good today. No pain right now.  Pain Assessment Currently in Pain?: No/denies  Precautions/Restrictions  Precautions Precautions: Shoulder  Exercise/Treatments Supine Protraction: PROM;AAROM;10 reps Horizontal ABduction: PROM;AAROM;10 reps External Rotation: PROM;AAROM;10 reps Internal Rotation: PROM;AAROM;10 reps Flexion: PROM;AAROM;10 reps ABduction: PROM;AAROM;10 reps Other Supine Exercises: bridging x 25 Seated Elevation: AROM;15 reps Extension: AROM;15 reps Row: AROM;15 reps Protraction: AAROM;10 reps Horizontal ABduction: AAROM;10 reps External Rotation: AAROM;10 reps Internal Rotation: AAROM;10 reps Flexion: AAROM;10 reps Pulleys Flexion: 1 minute ABduction: 1 minute Therapy Ball Flexion: 20 reps ABduction: 20 reps ROM / Strengthening / Isometric Strengthening Thumb Tacks: 1'      Manual Therapy Manual Therapy: Myofascial release Myofascial Release: MFR and manual stretching to right upper arm, trapezius, SCM, scapular region and shoulder region to decrease fascial restrictions and increase pain free mobility  Occupational Therapy Assessment and Plan OT Assessment and Plan Clinical Impression Statement: A: Add AAROM seated and pulleys. Patient tolerated well. Patient did not require assistance with supine ABD.  OT Plan: P: Increase time with pulleys and repitions with supine AAROM.    Goals Short Term Goals Time to Complete Short Term Goals: 6 weeks Short Term Goal 1: Patient will be educated on HEP.  Short Term Goal 1 Progress: Progressing toward  goal Short Term Goal 2: Patient will increase right shoulder PROM bto WFL for for greater ease with donning and doffing shirt. Short Term Goal 2 Progress: Progressing toward goal Short Term Goal 3: Patient will increase his right shoulder strength to 3+/5 for increased ability to lift pots and pans when cooking. Short Term Goal 3 Progress: Progressing toward goal Short Term Goal 4: Patient will decrease fascial restrictions from moderate-max to moderate for greater mobility needed for personal hygiene completion. Short Term Goal 4 Progress: Progressing toward goal Short Term Goal 5: Patient will decrease pain to 3/10 in his right shoulder for increased abilty to sleep at night. Short Term Goal 5 Progress: Progressing toward goal Long Term Goals Time to Complete Long Term Goals: 12 weeks Long Term Goal 1: Patient will return to highest level of independence for work, leisure, and daily activies.  Long Term Goal 1 Progress: Progressing toward goal Long Term Goal 2: Patient will increase rightt shoulder AROM to WNL for increased abilty to cast fishing rod with more ease. Long Term Goal 2 Progress: Progressing toward goal Long Term Goal 3: Patient will increase right shoulder strength to 5/5 for increased ability to lift pots and pans when cooking.  Long Term Goal 3 Progress: Progressing toward goal Long Term Goal 4: Patient will decrease fascial restrictions to minimal in his rightt shoulder region for greater mobility required to raise arm up to reach into overhead cabinets. Long Term Goal 4 Progress: Progressing toward goal Long Term Goal 5: Patient will decrease pain in his right shoulder region to 1/10 when completing desired daily activities.  Long Term Goal 5 Progress: Progressing toward goal  Problem List Patient Active Problem List   Diagnosis Date Noted  . Pain in joint, shoulder region 04/08/2013  . Muscle weakness (generalized) 04/08/2013  . Right rotator cuff  tear 02/25/2013  .  Gout 08/27/2012  . Foot infection 08/27/2012  . Achilles bursitis or tendinitis 08/27/2012  . Cervicalgia 06/24/2011  . Cervical disc disorder with radiculopathy 06/12/2011    End of Session Activity Tolerance: Patient tolerated treatment well General Behavior During Therapy: [facility] for tasks assessed/performed OT Plan of Care OT Home Exercise Plan: issue dowel rod exercises at next visit., educated on ER stretch with dowel rod today.   Limmie Patricia, OTR/L,CBIS   04/20/2013, 11:01 AM

## 2013-04-22 ENCOUNTER — Ambulatory Visit (HOSPITAL_COMMUNITY)
Admission: RE | Admit: 2013-04-22 | Discharge: 2013-04-22 | Disposition: A | Discharge status: Home / Self Care | Payer: Managed Care, Other (non HMO) | Source: Ambulatory Visit | Attending: Specialist | Admitting: Specialist

## 2013-04-22 NOTE — Progress Notes (Signed)
Occupational Therapy Treatment Patient Details  Name: Mark Phillips MRN: [id number] Date of Birth: 1971-05-06  Today's Date: 04/22/2013 Time: 4098-1191 OT Time Calculation (min): 52 min Manual Therapy 4782-9562 16' Therapeutic exercises 1034-1110 36' Visit#: 5 of 24  Re-eval: 05/06/13    Subjective Symptoms/Limitations Symptoms: S:  Its a little sore with this exercise at the end range (AAROM flexion in supine.) Limitations: Week 1-4 (5/22-619) Pendulum exercises, Supine PROM to tolerance, Wrist and elbow AROM, isometric supine. Weeks 5-8 (6/19-7/10) Progress PROM to full range, pulleys, may begin AAROM when patient has full pain free arc. Discontinue sling. Weeks 9-12 (7/10-7/31) Progress to AROM, light resistive exercises to tolerance, Theraband, caution unsupported lifting at home. Weeks 13-16 (10/2-10/23) Progress resistive strengthening to tolerance, scapular stabilizing exercise. Pain Assessment Currently in Pain?: Yes Pain Score: 1  Pain Location: Shoulder Pain Orientation: Right Pain Type: Acute pain  Precautions/Restrictions    Week 1-4 (5/22-619) Pendulum exercises, Supine PROM to tolerance, Wrist and elbow AROM, isometric supine. Weeks 5-8 (6/19-7/10) Progress PROM to full range, pulleys, may begin AAROM when patient has full pain free arc. Discontinue sling. Weeks 9-12 (7/10-7/31) Progress to AROM, light resistive exercises to tolerance, Theraband, caution unsupported lifting at home. Weeks 13-16 (10/2-10/23) Progress resistive strengthening to tolerance, scapular stabilizing exercise.   Exercise/Treatments Supine Protraction: PROM;10 reps;AAROM;12 reps Horizontal ABduction: PROM;10 reps;AAROM;12 reps External Rotation: PROM;10 reps;AAROM;12 reps Internal Rotation: PROM;10 reps;AAROM;12 reps Flexion: PROM;10 reps;AAROM;12 reps ABduction: PROM;10 reps;AAROM;12 reps Seated Elevation: AROM;15 reps Extension: AROM;15 reps Row: AROM;15 reps Protraction: AAROM;12  reps Horizontal ABduction: AAROM;12 reps External Rotation: AAROM;12 reps Internal Rotation: AAROM;12 reps Flexion: AAROM;12 reps Abduction: AAROM;12 reps Pulleys Flexion: 2 minutes ABduction: 2 minutes Therapy Ball Flexion: 20 reps ABduction: 20 reps Right/Left: 5 reps ROM / Strengthening / Isometric Strengthening Wall Wash: 1' Thumb Tacks: 1' Prot/Ret//Elev/Dep: 1'      Manual Therapy Manual Therapy: Myofascial release Myofascial Release: MFR and manual stretching to right upper arm, trapezius, SCM, scapular region and shoulder region to decrease fascial restrictions and increase pain free mobility  Occupational Therapy Assessment and Plan OT Assessment and Plan Clinical Impression Statement: A:  Added AAROM to HEP.  Added ball circles and increased time with pulleys.  AAROM WFL in supine and seated. OT Plan: P:  Begin AROM in supine, continue AAROM ER stretch.   Goals Short Term Goals Time to Complete Short Term Goals: 6 weeks Short Term Goal 1: Patient will be educated on HEP.  Short Term Goal 1 Progress: Progressing toward goal Short Term Goal 2: Patient will increase right shoulder PROM bto WFL for for greater ease with donning and doffing shirt. Short Term Goal 2 Progress: Progressing toward goal Short Term Goal 3: Patient will increase his right shoulder strength to 3+/5 for increased ability to lift pots and pans when cooking. Short Term Goal 3 Progress: Progressing toward goal Short Term Goal 4: Patient will decrease fascial restrictions from moderate-max to moderate for greater mobility needed for personal hygiene completion. Short Term Goal 4 Progress: Progressing toward goal Short Term Goal 5: Patient will decrease pain to 3/10 in his right shoulder for increased abilty to sleep at night. Short Term Goal 5 Progress: Progressing toward goal Long Term Goals Time to Complete Long Term Goals: 12 weeks Long Term Goal 1: Patient will return to highest level of  independence for work, leisure, and daily activies.  Long Term Goal 1 Progress: Progressing toward goal Long Term Goal 2: Patient will increase rightt shoulder AROM to  WNL for increased abilty to cast fishing rod with more ease. Long Term Goal 2 Progress: Progressing toward goal Long Term Goal 3: Patient will increase right shoulder strength to 5/5 for increased ability to lift pots and pans when cooking.  Long Term Goal 3 Progress: Progressing toward goal Long Term Goal 4: Patient will decrease fascial restrictions to minimal in his rightt shoulder region for greater mobility required to raise arm up to reach into overhead cabinets. Long Term Goal 4 Progress: Progressing toward goal Long Term Goal 5: Patient will decrease pain in his right shoulder region to 1/10 when completing desired daily activities.  Long Term Goal 5 Progress: Progressing toward goal  Problem List Patient Active Problem List   Diagnosis Date Noted  . Pain in joint, shoulder region 04/08/2013  . Muscle weakness (generalized) 04/08/2013  . Right rotator cuff tear 02/25/2013  . Gout 08/27/2012  . Foot infection 08/27/2012  . Achilles bursitis or tendinitis 08/27/2012  . Cervicalgia 06/24/2011  . Cervical disc disorder with radiculopathy 06/12/2011    End of Session Activity Tolerance: Patient tolerated treatment well General Behavior During Therapy: [facility] for tasks assessed/performed  GO    Shirlean Mylar, OTR/L  04/22/2013, 11:14 AM

## 2013-04-27 ENCOUNTER — Ambulatory Visit (HOSPITAL_COMMUNITY)
Admission: RE | Admit: 2013-04-27 | Discharge: 2013-04-27 | Disposition: A | Discharge status: Home / Self Care | Payer: Managed Care, Other (non HMO) | Source: Ambulatory Visit | Attending: Specialist | Admitting: Specialist

## 2013-04-27 NOTE — Progress Notes (Signed)
Occupational Therapy Treatment Patient Details  Name: Mark Phillips MRN: [id number] Date of Birth: 1970-12-27  Today's Date: 04/27/2013 Time: 1016-1100 OT Time Calculation (min): 44 min MFR 4098-1191 11' Therex 1027-1100 33'  Visit#: 6 of 24  Re-eval: 05/06/13    Authorization:    Authorization Time Period:    Authorization Visit#:   of    Subjective Symptoms/Limitations Symptoms: S: My shoulder feels good today.  Pain Assessment Currently in Pain?: No/denies  Precautions/Restrictions  Precautions Precautions: Shoulder Type of Shoulder Precautions: Week 1-4 (7/3-7/31) Pendulum exercises, Supine PROM to tolerance, Wrist and elbow AROM, isometric supine. Weeks 5-8 (8/7-8/28) Progress PROM to full range, pulleys, may begin AAROM when patient has full pain free arc. Discontinue sling. Weeks 9-12 (9/4-9/25) Progress to AROM, light resistive exercises to tolerance, Theraband, caution unsupported lifting at home. Weeks 13-16 (10/2-10/23) Progress resistive strengthening to tolerance, scapular stabilizing exercise.  Exercise/Treatments Supine Protraction: PROM;10 reps;AROM;12 reps Horizontal ABduction: PROM;10 reps;AROM;12 reps External Rotation: PROM;10 reps;AAROM;12 reps Internal Rotation: PROM;10 reps;AROM;12 reps Flexion: PROM;10 reps;AROM;12 reps ABduction: PROM;10 reps;AROM;12 reps Seated Elevation: AROM;15 reps Extension: AROM;15 reps Row: AROM;15 reps Protraction: AROM;12 reps Horizontal ABduction: AROM;12 reps External Rotation: AAROM;12 reps Internal Rotation: AROM;12 reps Flexion: AROM;12 reps Abduction: AROM;12 reps Pulleys Flexion: 2 minutes ABduction: 2 minutes ROM / Strengthening / Isometric Strengthening Wall Wash: 1' Thumb Tacks: 1' Proximal Shoulder Strengthening, Supine: 12X      Manual Therapy Manual Therapy: Myofascial release Myofascial Release: MFR and manual stretching to right upper arm, trapezius, SCM, scapular region and shoulder region to  decrease fascial restrictions and increase pain free mobility  Occupational Therapy Assessment and Plan OT Assessment and Plan Clinical Impression Statement: A: Added AROM supine and seated. Continued to perform ER AAROM supine and seated. Patient tolerated well.  OT Plan: P: Increase reps with supine and seated.    Goals Short Term Goals Time to Complete Short Term Goals: 6 weeks Short Term Goal 1: Patient will be educated on HEP.  Short Term Goal 1 Progress: Progressing toward goal Short Term Goal 2: Patient will increase right shoulder PROM bto WFL for for greater ease with donning and doffing shirt. Short Term Goal 2 Progress: Progressing toward goal Short Term Goal 3: Patient will increase his right shoulder strength to 3+/5 for increased ability to lift pots and pans when cooking. Short Term Goal 3 Progress: Progressing toward goal Short Term Goal 4: Patient will decrease fascial restrictions from moderate-max to moderate for greater mobility needed for personal hygiene completion. Short Term Goal 4 Progress: Progressing toward goal Short Term Goal 5: Patient will decrease pain to 3/10 in his right shoulder for increased abilty to sleep at night. Short Term Goal 5 Progress: Progressing toward goal Long Term Goals Time to Complete Long Term Goals: 12 weeks Long Term Goal 1: Patient will return to highest level of independence for work, leisure, and daily activies.  Long Term Goal 1 Progress: Progressing toward goal Long Term Goal 2: Patient will increase rightt shoulder AROM to WNL for increased abilty to cast fishing rod with more ease. Long Term Goal 2 Progress: Progressing toward goal Long Term Goal 3: Patient will increase right shoulder strength to 5/5 for increased ability to lift pots and pans when cooking.  Long Term Goal 3 Progress: Progressing toward goal Long Term Goal 4: Patient will decrease fascial restrictions to minimal in his rightt shoulder region for greater  mobility required to raise arm up to reach into overhead cabinets. Long Term Goal  4 Progress: Progressing toward goal Long Term Goal 5: Patient will decrease pain in his right shoulder region to 1/10 when completing desired daily activities.  Long Term Goal 5 Progress: Progressing toward goal  Problem List Patient Active Problem List   Diagnosis Date Noted  . Pain in joint, shoulder region 04/08/2013  . Muscle weakness (generalized) 04/08/2013  . Right rotator cuff tear 02/25/2013  . Gout 08/27/2012  . Foot infection 08/27/2012  . Achilles bursitis or tendinitis 08/27/2012  . Cervicalgia 06/24/2011  . Cervical disc disorder with radiculopathy 06/12/2011    End of Session Activity Tolerance: Patient tolerated treatment well General Behavior During Therapy: [facility] for tasks assessed/performed   Limmie Patricia, OTR/L,CBIS   04/27/2013, 10:55 AM

## 2013-04-29 ENCOUNTER — Ambulatory Visit (HOSPITAL_COMMUNITY)
Admission: RE | Admit: 2013-04-29 | Discharge: 2013-04-29 | Disposition: A | Discharge status: Home / Self Care | Payer: Managed Care, Other (non HMO) | Source: Ambulatory Visit | Attending: Specialist | Admitting: Specialist

## 2013-04-29 NOTE — Progress Notes (Signed)
Occupational Therapy Treatment Patient Details  Name: Mark Phillips MRN: [id number] Date of Birth: 12-04-1970  Today's Date: 04/29/2013 Time: 7253-6644 OT Time Calculation (min): 52 min Manual Therapy 1020-1040 20' Therapeutic Exercises [phone] 32' Visit#: 7 of 24  Re-eval: 05/06/13    Subjective S:  Its pretty sore today, aggrevating.  I may have slept on it wron. Limitations: Week 1-4 (5/22-619) Pendulum exercises, Supine PROM to tolerance, Wrist and elbow AROM, isometric supine. Weeks 5-8 (6/19-7/10) Progress PROM to full range, pulleys, may begin AAROM when patient has full pain free arc. Discontinue sling. Weeks 9-12 (7/10-7/31) Progress to AROM, light resistive exercises to tolerance, Theraband, caution unsupported lifting at home. Weeks 13-16 (10/2-10/23) Progress resistive strengthening to tolerance, scapular stabilizing exercise. Pain Assessment Currently in Pain?: Yes Pain Score: 3  Pain Location: Shoulder Pain Orientation: Right Pain Type: Acute pain  Precautions/Restrictions    Limitations: Week 1-4 (5/22-619) Pendulum exercises, Supine PROM to tolerance, Wrist and elbow AROM, isometric supine. Weeks 5-8 (6/19-7/10) Progress PROM to full range, pulleys, may begin AAROM when patient has full pain free arc. Discontinue sling. Weeks 9-12 (7/10-7/31) Progress to AROM, light resistive exercises to tolerance, Theraband, caution unsupported lifting at home. Weeks 13-16 (10/2-10/23) Progress resistive strengthening to tolerance, scapular stabilizing exercise.  Exercise/Treatments Supine Protraction: PROM;10 reps;AROM;15 reps Horizontal ABduction: PROM;10 reps;AROM;15 reps External Rotation: PROM;10 reps;AROM;15 reps Internal Rotation: PROM;10 reps;AROM;15 reps Flexion: PROM;10 reps;AROM;15 reps ABduction: PROM;10 reps;AROM;15 reps Seated Protraction: AROM;12 reps Horizontal ABduction: AROM;12 reps External Rotation: AROM;12 reps Internal Rotation: AROM;12 reps Flexion:  AROM;12 reps Abduction: AROM;12 reps Standing External Rotation: Theraband;10 reps Theraband Level (Shoulder External Rotation): Level 2 (Red) Internal Rotation: Theraband;10 reps Theraband Level (Shoulder Internal Rotation): Level 2 (Red) Extension: Theraband;10 reps Theraband Level (Shoulder Extension): Level 2 (Red) Row: Theraband;10 reps Theraband Level (Shoulder Row): Level 2 (Red) Pulleys Flexion: Other (comment) (dc) ABduction: Other (comment) (dc ) Therapy Ball Flexion: 20 reps ABduction: 20 reps Right/Left: 5 reps ROM / Strengthening / Isometric Strengthening Wall Wash: 2' Thumb Tacks: 1' Proximal Shoulder Strengthening, Supine: resume next visit Prot/Ret//Elev/Dep: 1'       Manual Therapy Manual Therapy: Myofascial release Myofascial Release: MFR and manual stretching to right upper arm, trapezius, SCM, scapular region,cervical region, manual cervical traction,  and shoulder region to decrease fascial restrictions and increase pain free mobility.  Occupational Therapy Assessment and Plan OT Assessment and Plan Clinical Impression Statement: A:  Able to complete AROM through all ranges this date.  Added tband for scapular stability.   OT Plan: P:  Add proximal shoulder strengthening in seated, add UBE 2' forward and reverse at 1.0.   Goals Short Term Goals Time to Complete Short Term Goals: 6 weeks Short Term Goal 1: Patient will be educated on HEP.  Short Term Goal 2: Patient will increase right shoulder PROM bto WFL for for greater ease with donning and doffing shirt. Short Term Goal 3: Patient will increase his right shoulder strength to 3+/5 for increased ability to lift pots and pans when cooking. Short Term Goal 4: Patient will decrease fascial restrictions from moderate-max to moderate for greater mobility needed for personal hygiene completion. Short Term Goal 5: Patient will decrease pain to 3/10 in his right shoulder for increased abilty to sleep at  night. Long Term Goals Time to Complete Long Term Goals: 12 weeks Long Term Goal 1: Patient will return to highest level of independence for work, leisure, and daily activies.  Long Term Goal 2: Patient will increase rightt  shoulder AROM to WNL for increased abilty to cast fishing rod with more ease. Long Term Goal 3: Patient will increase right shoulder strength to 5/5 for increased ability to lift pots and pans when cooking.  Long Term Goal 4: Patient will decrease fascial restrictions to minimal in his rightt shoulder region for greater mobility required to raise arm up to reach into overhead cabinets. Long Term Goal 5: Patient will decrease pain in his right shoulder region to 1/10 when completing desired daily activities.   Problem List Patient Active Problem List   Diagnosis Date Noted  . Pain in joint, shoulder region 04/08/2013  . Muscle weakness (generalized) 04/08/2013  . Right rotator cuff tear 02/25/2013  . Gout 08/27/2012  . Foot infection 08/27/2012  . Achilles bursitis or tendinitis 08/27/2012  . Cervicalgia 06/24/2011  . Cervical disc disorder with radiculopathy 06/12/2011    End of Session Activity Tolerance: Patient tolerated treatment well  Shirlean Mylar, OTR/L   04/29/2013, 11:09 AM

## 2013-05-04 ENCOUNTER — Ambulatory Visit (HOSPITAL_COMMUNITY): Payer: Managed Care, Other (non HMO)

## 2013-05-04 ENCOUNTER — Ambulatory Visit (HOSPITAL_COMMUNITY)
Admission: RE | Admit: 2013-05-04 | Discharge: 2013-05-04 | Disposition: A | Discharge status: Home / Self Care | Payer: Managed Care, Other (non HMO) | Source: Ambulatory Visit | Attending: Specialist | Admitting: Specialist

## 2013-05-04 NOTE — Evaluation (Signed)
Occupational Therapy Re-Evaluation  Patient Details  Name: Mark Phillips MRN: [id number] Date of Birth: 1971/10/02  Today's Date: 05/04/2013 Time: 4034-7425 OT Time Calculation (min): 34 min MFR 9563-8756 9' Therex 4332-9518 10' Reassess 8416-6063 15'  Visit#: 8 of 24  Re-eval: 06/01/13  Assessment Diagnosis: Right shoulder rotator cuff repair  Authorization:    Authorization Time Period:    Authorization Visit#:   of     Past Medical History:  Past Medical History  Diagnosis Date  . Hypertension   . High cholesterol   . Anginal pain     2010  . GERD (gastroesophageal reflux disease)     hx of  . Arthritis   . PONV (postoperative nausea and vomiting)   . Sleep apnea    Past Surgical History:  Past Surgical History  Procedure Laterality Date  . Colon surgery    . Ankle reconstruction      lt reconstruction and rt scope sx  . Appendectomy  2000  . Wisdom tooth extraction  2011  . Shoulder arthroscopy with subacromial decompression and open rotator c Right 02/25/2013    Procedure: RIGHT SHOULDER ARTHROSCOPY WITH SUBACROMIAL DECOMPRESSION AND MINI OPEN ROTATOR CUFF REPAIR;  Surgeon: Javier Docker, MD;  Location: [facility];  Service: Orthopedics;  Laterality: Right;    Subjective Symptoms/Limitations Symptoms: S: My arm is only a little sore; not bad.  Pain Assessment Currently in Pain?: Yes Pain Score: 1  Pain Location: Shoulder Pain Orientation: Right Pain Type: Acute pain  Precautions/Restrictions  Precautions Precautions: Shoulder   Assessment Additional Assessments RUE Assessment RUE Assessment:  (assessed supine/seated. ER/IR ADD) RUE AROM (degrees) Right Shoulder Flexion:  (149/129 (on eval: 106 supine)) Right Shoulder ABduction:  (160/139 (on eval: 86 supine)) Right Shoulder Internal Rotation:  (90/90 (on eval: 90 supine)) Right Shoulder External Rotation:  (50/55 (On eval: 35 supine)) RUE Strength Right Shoulder Flexion: 4/5 Right Shoulder  ABduction: 4/5 Right Shoulder Internal Rotation: 4/5 Right Shoulder External Rotation: 4/5 Palpation Palpation: min fascia restrictions in right upper arm, trapezius, and scapualris region.      Exercise/Treatments Supine Protraction: PROM;10 reps;AROM;15 reps Horizontal ABduction: PROM;10 reps;AROM;15 reps External Rotation: PROM;10 reps;AROM;15 reps Internal Rotation: PROM;10 reps;AROM;15 reps Flexion: PROM;10 reps;AROM;15 reps ABduction: PROM;10 reps;AROM;15 reps ROM / Strengthening / Isometric Strengthening Proximal Shoulder Strengthening, Supine: 15X      Manual Therapy Manual Therapy: Myofascial release Myofascial Release: MFR and manual stretching to right upper arm, trapezius, SCM, scapular region,cervical region, manual cervical traction, and shoulder region to decrease fascial restrictions and increase pain free mobility.  Occupational Therapy Assessment and Plan OT Assessment and Plan Clinical Impression Statement: A: Patient has met all STG and 2/5 LTG. Read MD note for progress.  OT Plan: P: Attempt 1# supine, give theraband as HEP   Goals Short Term Goals Time to Complete Short Term Goals: 6 weeks Short Term Goal 1: Patient will be educated on HEP.  Short Term Goal 1 Progress: Met Short Term Goal 2: Patient will increase right shoulder PROM bto WFL for for greater ease with donning and doffing shirt. Short Term Goal 2 Progress: Met Short Term Goal 3: Patient will increase his right shoulder strength to 3+/5 for increased ability to lift pots and pans when cooking. Short Term Goal 3 Progress: Met Short Term Goal 4: Patient will decrease fascial restrictions from moderate-max to moderate for greater mobility needed for personal hygiene completion. Short Term Goal 4 Progress: Met Short Term Goal 5: Patient will decrease pain  to 3/10 in his right shoulder for increased abilty to sleep at night. Short Term Goal 5 Progress: Met Long Term Goals Time to Complete Long  Term Goals: 12 weeks Long Term Goal 1: Patient will return to highest level of independence for work, leisure, and daily activies.  Long Term Goal 1 Progress: Progressing toward goal Long Term Goal 2: Patient will increase rightt shoulder AROM to WNL for increased abilty to cast fishing rod with more ease. Long Term Goal 2 Progress: Met Long Term Goal 3: Patient will increase right shoulder strength to 5/5 for increased ability to lift pots and pans when cooking.  Long Term Goal 3 Progress: Progressing toward goal Long Term Goal 4: Patient will decrease fascial restrictions to minimal in his rightt shoulder region for greater mobility required to raise arm up to reach into overhead cabinets. Long Term Goal 4 Progress: Met Long Term Goal 5: Patient will decrease pain in his right shoulder region to 1/10 when completing desired daily activities.  Long Term Goal 5 Progress: Progressing toward goal  Problem List Patient Active Problem List   Diagnosis Date Noted  . Pain in joint, shoulder region 04/08/2013  . Muscle weakness (generalized) 04/08/2013  . Right rotator cuff tear 02/25/2013  . Gout 08/27/2012  . Foot infection 08/27/2012  . Achilles bursitis or tendinitis 08/27/2012  . Cervicalgia 06/24/2011  . Cervical disc disorder with radiculopathy 06/12/2011    End of Session Activity Tolerance: Patient tolerated treatment well General Behavior During Therapy: [facility] for tasks assessed/performed   Limmie Patricia, OTR/L,CBIS   05/04/2013, 10:55 AM  Physician Documentation Your signature is required to indicate approval of the treatment plan as stated above.  Please sign and either send electronically or make a copy of this report for your files and return this physician signed original.  Please mark one 1.__approve of plan  2. ___approve of plan with the following conditions.   ______________________________                                                           _____________________ Physician Signature                                                                                                             Date

## 2013-05-06 ENCOUNTER — Ambulatory Visit (HOSPITAL_COMMUNITY): Payer: Managed Care, Other (non HMO)

## 2013-05-11 ENCOUNTER — Ambulatory Visit (HOSPITAL_COMMUNITY)
Admission: RE | Admit: 2013-05-11 | Discharge: 2013-05-11 | Disposition: A | Discharge status: Home / Self Care | Payer: Managed Care, Other (non HMO) | Source: Ambulatory Visit | Attending: Specialist | Admitting: Specialist

## 2013-05-11 DIAGNOSIS — M25519 Pain in unspecified shoulder: Secondary | ICD-10-CM | POA: Insufficient documentation

## 2013-05-11 DIAGNOSIS — I10 Essential (primary) hypertension: Secondary | ICD-10-CM | POA: Insufficient documentation

## 2013-05-11 DIAGNOSIS — IMO0001 Reserved for inherently not codable concepts without codable children: Secondary | ICD-10-CM | POA: Insufficient documentation

## 2013-05-11 DIAGNOSIS — M6281 Muscle weakness (generalized): Secondary | ICD-10-CM | POA: Insufficient documentation

## 2013-05-11 NOTE — Progress Notes (Signed)
Occupational Therapy Treatment Patient Details  Name: Mark Phillips MRN: [id number] Date of Birth: 05/19/1971  Today's Date: 05/11/2013 Time: 1022-1057 OT Time Calculation (min): 35 min Manual Therapy 1022-1040 18' Therapeutic Exercises 1040-1057 17' Visit#: 9 of 24  Re-eval: 06/01/13    Subjective  S:  I was cutting a lot of veggies this weekend, and it was a bit uncomfortable.  Limitations: Week 1-4 (5/22-619) Pendulum exercises, Supine PROM to tolerance, Wrist and elbow AROM, isometric supine. Weeks 5-8 (6/19-7/10) Progress PROM to full range, pulleys, may begin AAROM when patient has full pain free arc. Discontinue sling. Weeks 9-12 (7/10-7/31) Progress to AROM, light resistive exercises to tolerance, Theraband, caution unsupported lifting at home. Weeks 13-16 (10/2-10/23) Progress resistive strengthening to tolerance, scapular stabilizing exercise. Pain Assessment Currently in Pain?: Yes Pain Score: 1  Pain Location: Shoulder Pain Orientation: Right Pain Type: Acute pain  Precautions/Restrictions    Week 1-4 (5/22-619) Pendulum exercises, Supine PROM to tolerance, Wrist and elbow AROM, isometric supine. Weeks 5-8 (6/19-7/10) Progress PROM to full range, pulleys, may begin AAROM when patient has full pain free arc. Discontinue sling. Weeks 9-12 (7/10-7/31) Progress to AROM, light resistive exercises to tolerance, Theraband, caution unsupported lifting at home. Weeks 13-16 (10/2-10/23) Progress resistive strengthening to tolerance, scapular stabilizing exercise.   Exercise/Treatments Supine Protraction: PROM;10 reps;AROM;15 reps Horizontal ABduction: PROM;10 reps;AROM;15 reps External Rotation: PROM;10 reps;AROM;15 reps Internal Rotation: PROM;10 reps;AROM;15 reps Flexion: PROM;10 reps;AROM;15 reps ABduction: PROM;10 reps;AROM;15 reps Seated Protraction: AROM;15 reps Horizontal ABduction: AROM;15 reps External Rotation: AROM;15 reps Internal Rotation: AROM;15 reps Flexion:  AROM;15 reps Abduction: AROM;15 reps ROM / Strengthening / Isometric Strengthening UBE (Upper Arm Bike): begin next visit Wall Wash: 3' Thumb Tacks: dc "W" Arms: 10X X to V Arms: 10X Proximal Shoulder Strengthening, Seated: 10X each without resting Prot/Ret//Elev/Dep: dc      Manual Therapy Manual Therapy: Myofascial release Myofascial Release: MFR and manual stretching to right upper arm, trapezius, SCM, scapular region,cervical region, manual cervical traction, and shoulder region to decrease fascial restrictions and increase pain free mobility  Occupational Therapy Assessment and Plan OT Assessment and Plan Clinical Impression Statement: A:  Patient continues to have limited AROM into flexion and external rotation in supine.  Educated patient on HEP for tband for scapular stabilty for HEP.  OT Plan: P:  Attempt 1# in supine for all ranges that are WNL.    Goals Short Term Goals Time to Complete Short Term Goals: 6 weeks Short Term Goal 1: Patient will be educated on HEP.  Short Term Goal 2: Patient will increase right shoulder PROM bto WFL for for greater ease with donning and doffing shirt. Short Term Goal 3: Patient will increase his right shoulder strength to 3+/5 for increased ability to lift pots and pans when cooking. Short Term Goal 4: Patient will decrease fascial restrictions from moderate-max to moderate for greater mobility needed for personal hygiene completion. Short Term Goal 5: Patient will decrease pain to 3/10 in his right shoulder for increased abilty to sleep at night. Long Term Goals Time to Complete Long Term Goals: 12 weeks Long Term Goal 1: Patient will return to highest level of independence for work, leisure, and daily activies.  Long Term Goal 1 Progress: Progressing toward goal Long Term Goal 2: Patient will increase rightt shoulder AROM to WNL for increased abilty to cast fishing rod with more ease. Long Term Goal 2 Progress: Progressing toward  goal Long Term Goal 3: Patient will increase right shoulder strength to 5/5  for increased ability to lift pots and pans when cooking.  Long Term Goal 3 Progress: Progressing toward goal Long Term Goal 4: Patient will decrease fascial restrictions to minimal in his rightt shoulder region for greater mobility required to raise arm up to reach into overhead cabinets. Long Term Goal 4 Progress: Progressing toward goal Long Term Goal 5: Patient will decrease pain in his right shoulder region to 1/10 when completing desired daily activities.  Long Term Goal 5 Progress: Progressing toward goal  Problem List Patient Active Problem List   Diagnosis Date Noted  . Pain in joint, shoulder region 04/08/2013  . Muscle weakness (generalized) 04/08/2013  . Right rotator cuff tear 02/25/2013  . Gout 08/27/2012  . Foot infection 08/27/2012  . Achilles bursitis or tendinitis 08/27/2012  . Cervicalgia 06/24/2011  . Cervical disc disorder with radiculopathy 06/12/2011    End of Session Activity Tolerance: Patient tolerated treatment well General Behavior During Therapy: [facility] for tasks assessed/performed OT Plan of Care OT Home Exercise Plan: issued red tband and scapular tband exercises that patient has been completing in therapy.   GO    Shirlean Mylar, OTR/L  05/11/2013, 10:58 AM

## 2013-05-13 ENCOUNTER — Ambulatory Visit (HOSPITAL_COMMUNITY)
Admission: RE | Admit: 2013-05-13 | Discharge: 2013-05-13 | Disposition: A | Discharge status: Home / Self Care | Payer: Managed Care, Other (non HMO) | Source: Ambulatory Visit | Attending: Internal Medicine | Admitting: Internal Medicine

## 2013-05-13 NOTE — Progress Notes (Signed)
Occupational Therapy Treatment Patient Details  Name: Mark Phillips MRN: [id number] Date of Birth: 1971-07-24  Today's Date: 05/13/2013 Time: 7829-5621 OT Time Calculation (min): 39 min MFR 936-950 14' Therex [id number] 25'  Visit#: 10 of 24  Re-eval: 06/01/13    Authorization:    Authorization Time Period:    Authorization Visit#:   of    Subjective Symptoms/Limitations Symptoms: S: My arm is a little sore today from doing yardwork yesterday. Pain Assessment Currently in Pain?: Yes Pain Score: 1  Pain Location: Shoulder Pain Orientation: Right Pain Type: Acute pain  Precautions/Restrictions  Precautions Precautions: Shoulder  Exercise/Treatments Supine Protraction: PROM;10 reps;Strengthening;Weights;12 reps Protraction Weight (lbs): 1 Horizontal ABduction: PROM;10 reps;Strengthening;Weights;12 reps Horizontal ABduction Weight (lbs): 1 External Rotation: PROM;10 reps;Strengthening;Weights;12 reps External Rotation Weight (lbs): 1 Internal Rotation: PROM;10 reps;Strengthening;Weights;12 reps Internal Rotation Weight (lbs): 1 Flexion: PROM;10 reps;Strengthening;Weights;12 reps Shoulder Flexion Weight (lbs): 1 ABduction: PROM;10 reps;Strengthening;Weights;12 reps Shoulder ABduction Weight (lbs): 1 Therapy Ball Flexion:  (d/c) ABduction:  (d/c) Right/Left:  (d/c) ROM / Strengthening / Isometric Strengthening UBE (Upper Arm Bike): 1.0 3' forward 3' reverse Wall Wash: 3' "W" Arms: 10X with 1# X to V Arms: 10X with1# Proximal Shoulder Strengthening, Supine: 12X with 1#       Manual Therapy Manual Therapy: Myofascial release Myofascial Release: MFR and manual stretching to right upper arm, trapezius, SCM, scapular region,cervical region, manual cervical traction, and shoulder region to decrease fascial restrictions and increase pain free mobility  Occupational Therapy Assessment and Plan OT Assessment and Plan Clinical Impression Statement: A: Added 1# to supine  strengthening exercises, W arms, and X to V arms. Added UBE bike. Patient tolerated well. AROM during flexion was tight but functional. External rotation continues to be limited.  OT Plan: P: Add 1# to seated exercises.    Goals Short Term Goals Time to Complete Short Term Goals: 6 weeks Short Term Goal 1: Patient will be educated on HEP.  Short Term Goal 2: Patient will increase right shoulder PROM bto WFL for for greater ease with donning and doffing shirt. Short Term Goal 3: Patient will increase his right shoulder strength to 3+/5 for increased ability to lift pots and pans when cooking. Short Term Goal 4: Patient will decrease fascial restrictions from moderate-max to moderate for greater mobility needed for personal hygiene completion. Short Term Goal 5: Patient will decrease pain to 3/10 in his right shoulder for increased abilty to sleep at night. Long Term Goals Time to Complete Long Term Goals: 12 weeks Long Term Goal 1: Patient will return to highest level of independence for work, leisure, and daily activies.  Long Term Goal 2: Patient will increase rightt shoulder AROM to WNL for increased abilty to cast fishing rod with more ease. Long Term Goal 3: Patient will increase right shoulder strength to 5/5 for increased ability to lift pots and pans when cooking.  Long Term Goal 4: Patient will decrease fascial restrictions to minimal in his rightt shoulder region for greater mobility required to raise arm up to reach into overhead cabinets. Long Term Goal 5: Patient will decrease pain in his right shoulder region to 1/10 when completing desired daily activities.   Problem List Patient Active Problem List   Diagnosis Date Noted  . Pain in joint, shoulder region 04/08/2013  . Muscle weakness (generalized) 04/08/2013  . Right rotator cuff tear 02/25/2013  . Gout 08/27/2012  . Foot infection 08/27/2012  . Achilles bursitis or tendinitis 08/27/2012  . Cervicalgia 06/24/2011  .  Cervical disc disorder with radiculopathy 06/12/2011    End of Session Activity Tolerance: Patient tolerated treatment well General Behavior During Therapy: [facility] for tasks assessed/performed   Limmie Patricia, OTR/L,CBIS   05/13/2013, 10:10 AM

## 2013-05-18 ENCOUNTER — Ambulatory Visit (HOSPITAL_COMMUNITY)
Admission: RE | Admit: 2013-05-18 | Discharge: 2013-05-18 | Disposition: A | Discharge status: Home / Self Care | Payer: Managed Care, Other (non HMO) | Source: Ambulatory Visit | Attending: Internal Medicine | Admitting: Internal Medicine

## 2013-05-18 NOTE — Progress Notes (Signed)
Occupational Therapy Treatment Patient Details  Name: Mark Phillips MRN: [id number] Date of Birth: 23-Oct-1970  Today's Date: 05/18/2013 Time: 1023-1106 OT Time Calculation (min): 43 min Manual therapy 1023-1040 17' Therapeutic exercises 1040-1106 26'- Visit#: 11 of 24  Re-eval: 06/01/13    Subjective S:  It felt like it wanted to catch a bit, and it is ok today.  Limitations: Week 1-4 (5/22-619) Pendulum exercises, Supine PROM to tolerance, Wrist and elbow AROM, isometric supine. Weeks 5-8 (6/19-7/10) Progress PROM to full range, pulleys, may begin AAROM when patient has full pain free arc. Discontinue sling. Weeks 9-12 (7/10-7/31) Progress to AROM, light resistive exercises to tolerance, Theraband, caution unsupported lifting at home. Weeks 13-16 (8/2-823 Progress resistive strengthening to tolerance, scapular stabilizing exercise. Pain Assessment Currently in Pain?: Yes Pain Score: 1  Pain Location: Shoulder Pain Orientation: Right Pain Type: Acute pain  Precautions/Restrictions    eek 1-4 (5/22-619) Pendulum exercises, Supine PROM to tolerance, Wrist and elbow AROM, isometric supine. Weeks 5-8 (6/19-7/10) Progress PROM to full range, pulleys, may begin AAROM when patient has full pain free arc. Discontinue sling. Weeks 9-12 (7/10-7/31) Progress to AROM, light resistive exercises to tolerance, Theraband, caution unsupported lifting at home. Weeks 13-16 (8/2-823 Progress resistive strengthening to tolerance, scapular stabilizing exercise.   Exercise/Treatments Supine Protraction: PROM;10 reps;Strengthening;12 reps Protraction Weight (lbs): 1 Horizontal ABduction: PROM;10 reps;Strengthening;12 reps Horizontal ABduction Weight (lbs): 1 External Rotation: PROM;10 reps;Strengthening;12 reps External Rotation Weight (lbs): 1 Internal Rotation: PROM;10 reps;Strengthening;12 reps Internal Rotation Weight (lbs): 1 Flexion: PROM;10 reps;Strengthening;12 reps Shoulder Flexion Weight  (lbs): 1 ABduction: PROM;5 reps;Strengthening;12 reps Shoulder ABduction Weight (lbs): 1 Seated Protraction: Strengthening;10 reps Protraction Weight (lbs): 1 Horizontal ABduction: Strengthening;10 reps Horizontal ABduction Weight (lbs): 1 External Rotation: Strengthening;10 reps External Rotation Weight (lbs): 1 Internal Rotation: Strengthening;10 reps Internal Rotation Weight (lbs): 1 Flexion: Strengthening;10 reps Flexion Weight (lbs): 1 Abduction: Strengthening;10 reps ABduction Weight (lbs): 1 ROM / Strengthening / Isometric Strengthening UBE (Upper Arm Bike): 1.0 3' forward 3' reverse Cybex Press: 1.5 plate;10 reps Cybex Row: 1.5 plate;10 reps "W" Arms: 10X with 1# X to V Arms: 10X with1# Proximal Shoulder Strengthening, Seated: 10X each without resting Ball on Wall: 1' flexed and 1' abducted       Manual Therapy Manual Therapy: Myofascial release Myofascial Release: MFR and manual stretching to right upper arm, trapezius, SCM, scapular region,cervical region, manual cervical traction, and shoulder region to decrease fascial restrictions and increase pain free mobility  Occupational Therapy Assessment and Plan OT Assessment and Plan Clinical Impression Statement: A:  Added strengtheing in seated this date with min vg to depress scapula.  Added cybex press and row. OT Plan: P:  Begin work hardening exercises such as shelf stock simulation, picking up boxes from floor to waist and waist to overhead.    Goals Short Term Goals Time to Complete Short Term Goals: 6 weeks Short Term Goal 1: Patient will be educated on HEP.  Short Term Goal 2: Patient will increase right shoulder PROM bto WFL for for greater ease with donning and doffing shirt. Short Term Goal 3: Patient will increase his right shoulder strength to 3+/5 for increased ability to lift pots and pans when cooking. Short Term Goal 4: Patient will decrease fascial restrictions from moderate-max to moderate for  greater mobility needed for personal hygiene completion. Short Term Goal 5: Patient will decrease pain to 3/10 in his right shoulder for increased abilty to sleep at night. Long Term Goals Time to Complete Long Term  Goals: 12 weeks Long Term Goal 1: Patient will return to highest level of independence for work, leisure, and daily activies.  Long Term Goal 1 Progress: Progressing toward goal Long Term Goal 2: Patient will increase rightt shoulder AROM to WNL for increased abilty to cast fishing rod with more ease. Long Term Goal 2 Progress: Progressing toward goal Long Term Goal 3: Patient will increase right shoulder strength to 5/5 for increased ability to lift pots and pans when cooking.  Long Term Goal 3 Progress: Progressing toward goal Long Term Goal 4: Patient will decrease fascial restrictions to minimal in his rightt shoulder region for greater mobility required to raise arm up to reach into overhead cabinets. Long Term Goal 4 Progress: Progressing toward goal Long Term Goal 5: Patient will decrease pain in his right shoulder region to 1/10 when completing desired daily activities.  Long Term Goal 5 Progress: Progressing toward goal  Problem List Patient Active Problem List   Diagnosis Date Noted  . Pain in joint, shoulder region 04/08/2013  . Muscle weakness (generalized) 04/08/2013  . Right rotator cuff tear 02/25/2013  . Gout 08/27/2012  . Foot infection 08/27/2012  . Achilles bursitis or tendinitis 08/27/2012  . Cervicalgia 06/24/2011  . Cervical disc disorder with radiculopathy 06/12/2011    End of Session Activity Tolerance: Patient tolerated treatment well General Behavior During Therapy: [facility] for tasks assessed/performed  GO    Shirlean Mylar, OTR/L  05/18/2013, 11:01 AM

## 2013-05-20 ENCOUNTER — Ambulatory Visit (HOSPITAL_COMMUNITY)
Admission: RE | Admit: 2013-05-20 | Discharge: 2013-05-20 | Disposition: A | Discharge status: Home / Self Care | Payer: Managed Care, Other (non HMO) | Source: Ambulatory Visit

## 2013-05-20 NOTE — Progress Notes (Signed)
Occupational Therapy Treatment Patient Details  Name: Mark Phillips MRN: [id number] Date of Birth: 01-03-1971  Today's Date: 05/20/2013 Time: 1020-1105 OT Time Calculation (min): 45 min MFR 1020-1031 11' Therex 4098-1191 34'  Visit#: 12 of 24  Re-eval: 06/01/13    Authorization:    Authorization Time Period:    Authorization Visit#:   of    Subjective Symptoms/Limitations Symptoms: S: My shoulder feels good today.  Pain Assessment Currently in Pain?: No/denies  Precautions/Restrictions  Precautions Precautions: Shoulder Type of Shoulder Precautions: Week 1-4 (7/3-7/31) Pendulum exercises, Supine PROM to tolerance, Wrist and elbow AROM, isometric supine. Weeks 5-8 (8/7-8/28) Progress PROM to full range, pulleys, may begin AAROM when patient has full pain free arc. Discontinue sling. Weeks 9-12 (9/4-9/25) Progress to AROM, light resistive exercises to tolerance, Theraband, caution unsupported lifting at home. Weeks 13-16 (10/2-10/23) Progress resistive strengthening to tolerance, scapular stabilizing exercise.  Exercise/Treatments Supine Protraction: PROM;10 reps;Strengthening;15 reps Protraction Weight (lbs): 1 Horizontal ABduction: PROM;10 reps;Strengthening;15 reps Horizontal ABduction Weight (lbs): 1 External Rotation: PROM;10 reps;Strengthening;15 reps External Rotation Weight (lbs): 1 Internal Rotation: PROM;10 reps;Strengthening;15 reps Internal Rotation Weight (lbs): 1 Flexion: PROM;10 reps;Strengthening;15 reps Shoulder Flexion Weight (lbs): 1 ABduction: PROM;5 reps;Strengthening;15 reps Shoulder ABduction Weight (lbs): 1 Standing Other Standing Exercises: weighted box with 5# inside.; waist to overhead lifting 12X Other Standing Exercises: weighted box with 5# inside. 2 laps around outside parameter of gym at waist height. ROM / Strengthening / Isometric Strengthening UBE (Upper Arm Bike): 2.0 3' forward 3' reverse Cybex Press: 1.5 plate (47W) Cybex Row: 1.5  plate (29F) Proximal Shoulder Strengthening, Supine: 15X with 1# Proximal Shoulder Strengthening, Seated: 15X each with 1#        Manual Therapy Manual Therapy: Myofascial release Myofascial Release: MFR and manual stretching to right upper arm, trapezius, SCM, scapular region,cervical region, manual cervical traction, and shoulder region to decrease fascial restrictions and increase pain free mobility  Occupational Therapy Assessment and Plan OT Assessment and Plan Clinical Impression Statement: A: Added work simulation activities with weighted box, increased resistance with UBE bike, increased reps with cybex press and row. Tolerated well.  OT Plan: P: Add graduated theraband exercises.    Goals Short Term Goals Time to Complete Short Term Goals: 6 weeks Short Term Goal 1: Patient will be educated on HEP.  Short Term Goal 2: Patient will increase right shoulder PROM bto WFL for for greater ease with donning and doffing shirt. Short Term Goal 3: Patient will increase his right shoulder strength to 3+/5 for increased ability to lift pots and pans when cooking. Short Term Goal 4: Patient will decrease fascial restrictions from moderate-max to moderate for greater mobility needed for personal hygiene completion. Short Term Goal 5: Patient will decrease pain to 3/10 in his right shoulder for increased abilty to sleep at night. Long Term Goals Time to Complete Long Term Goals: 12 weeks Long Term Goal 1: Patient will return to highest level of independence for work, leisure, and daily activies.  Long Term Goal 2: Patient will increase rightt shoulder AROM to WNL for increased abilty to cast fishing rod with more ease. Long Term Goal 3: Patient will increase right shoulder strength to 5/5 for increased ability to lift pots and pans when cooking.  Long Term Goal 4: Patient will decrease fascial restrictions to minimal in his rightt shoulder region for greater mobility required to raise arm up  to reach into overhead cabinets. Long Term Goal 5: Patient will decrease pain in his right shoulder  region to 1/10 when completing desired daily activities.   Problem List Patient Active Problem List   Diagnosis Date Noted  . Pain in joint, shoulder region 04/08/2013  . Muscle weakness (generalized) 04/08/2013  . Right rotator cuff tear 02/25/2013  . Gout 08/27/2012  . Foot infection 08/27/2012  . Achilles bursitis or tendinitis 08/27/2012  . Cervicalgia 06/24/2011  . Cervical disc disorder with radiculopathy 06/12/2011    End of Session Activity Tolerance: Patient tolerated treatment well General Behavior During Therapy: [facility] for tasks assessed/performed   Limmie Patricia, OTR/L,CBIS   05/20/2013, 11:01 AM

## 2013-05-25 ENCOUNTER — Ambulatory Visit (HOSPITAL_COMMUNITY): Payer: Managed Care, Other (non HMO)

## 2013-05-27 ENCOUNTER — Ambulatory Visit (HOSPITAL_COMMUNITY)
Admission: RE | Admit: 2013-05-27 | Discharge: 2013-05-27 | Disposition: A | Discharge status: Home / Self Care | Payer: Managed Care, Other (non HMO) | Source: Ambulatory Visit

## 2013-05-27 NOTE — Evaluation (Signed)
Occupational Therapy Evaluation  Patient Details  Name: Mark Phillips MRN: [id number] Date of Birth: June 23, 1971  Today's Date: 05/27/2013 Time: 2130-8657 OT Time Calculation (min): 39 min Manual therapy 8469-6295 13' ROM/MMT 1449- 1505  Therapeutic exercise 2841-3244 10' Visit#: 13 of 24  Re-eval: 06/24/13     Past Medical History:  Past Medical History  Diagnosis Date  . Hypertension   . High cholesterol   . Anginal pain     2010  . GERD (gastroesophageal reflux disease)     hx of  . Arthritis   . PONV (postoperative nausea and vomiting)   . Sleep apnea    Past Surgical History:  Past Surgical History  Procedure Laterality Date  . Colon surgery    . Ankle reconstruction      lt reconstruction and rt scope sx  . Appendectomy  2000  . Wisdom tooth extraction  2011  . Shoulder arthroscopy with subacromial decompression and open rotator c Right 02/25/2013    Procedure: RIGHT SHOULDER ARTHROSCOPY WITH SUBACROMIAL DECOMPRESSION AND MINI OPEN ROTATOR CUFF REPAIR;  Surgeon: Javier Docker, MD;  Location: [facility];  Service: Orthopedics;  Laterality: Right;    Subjective  S:  The weights made it a little sore.   Limitations: Week 1-4 (5/22-619) Pendulum exercises, Supine PROM to tolerance, Wrist and elbow AROM, isometric supine. Weeks 5-8 (6/19-7/10) Progress PROM to full range, pulleys, may begin AAROM when patient has full pain free arc. Discontinue sling. Weeks 9-12 (7/10-7/31) Progress to AROM, light resistive exercises to tolerance, Theraband, caution unsupported lifting at home. Weeks 13-16 (10/2-10/23) Progress resistive strengthening to tolerance, scapular stabilizing exercise. Special Tests: DASH was 59 and is currently 31.81 Pain Assessment Currently in Pain?: Yes Pain Score: 1  Pain Location: Shoulder Pain Orientation: Right Pain Type: Acute pain  Additional Assessments RUE AROM (degrees) RUE Overall AROM Comments: assessed in seated (05/04/13 seated AROM) Right  Shoulder Flexion: 160 Degrees (129) Right Shoulder ABduction: 155 Degrees ((139)) Right Shoulder Internal Rotation: 90 Degrees ((90)) Right Shoulder External Rotation: 60 Degrees ((55)) RUE Strength current (05/04/13) Right Shoulder Flexion:  (4+/5 (4/5)) Right Shoulder ABduction:  (4+/5 (4/5)) Right Shoulder Internal Rotation:  (4+/5 (4/5)) Right Shoulder External Rotation:  (4+/5 (4/5))     Exercise/Treatments Supine Protraction: PROM;10 reps Horizontal ABduction: PROM;10 reps External Rotation: PROM;10 reps Internal Rotation: PROM;10 reps Flexion: PROM;10 reps ABduction: PROM;10 reps Seated Protraction: Strengthening;15 reps Protraction Weight (lbs): 1 Horizontal ABduction: Strengthening;15 reps Horizontal ABduction Weight (lbs): 1 External Rotation: Strengthening;15 reps External Rotation Weight (lbs): 1 Internal Rotation: Strengthening;15 reps Internal Rotation Weight (lbs): 1 Flexion: Strengthening;15 reps Flexion Weight (lbs): 1 Abduction: Strengthening;15 reps ABduction Weight (lbs): 1 ROM / Strengthening / Isometric Strengthening UBE (Upper Arm Bike): 2.0 3' forward and3' reverse      Manual Therapy Manual Therapy: Myofascial release Myofascial Release: MFR and manual stretching to right upper arm, trapezius, SCM, scapular region,cervical region, manual cervical traction, and shoulder region to decrease fascial restrictions and increase pain free mobility  Occupational Therapy Assessment and Plan OT Assessment and Plan Clinical Impression Statement: A:  Patient has met all short term goals and is progressing towards long term goals.  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  Patient does not have sufficient AROM, strength, or sustained acitivity tolerance to complete his job task at this time and would benefit from continued skilled OT intervention to improve and return to work.  OT Plan: P:  Resume  work hardening, time reps of waist to overhead reach per minute.   Goals Short Term Goals Time to Complete Short Term Goals: 6 weeks Short Term Goal 1: Patient will be educated on HEP.  Short Term Goal 2: Patient will increase right shoulder PROM bto WFL for for greater ease with donning and doffing shirt. Short Term Goal 3: Patient will increase his right shoulder strength to 3+/5 for increased ability to lift pots and pans when cooking. Short Term Goal 4: Patient will decrease fascial restrictions from moderate-max to moderate for greater mobility needed for personal hygiene completion. Short Term Goal 5: Patient will decrease pain to 3/10 in his right shoulder for increased abilty to sleep at night. Long Term Goals Time to Complete Long Term Goals: 12 weeks Long Term Goal 1: Patient will return to highest level of independence for work, leisure, and daily activies.  Long Term Goal 1 Progress: Progressing toward goal Long Term Goal 2: Patient will increase rightt shoulder AROM to WNL for increased abilty to cast fishing rod with more ease. Long Term Goal 2 Progress: Progressing toward goal Long Term Goal 3: Patient will increase right shoulder strength to 5/5 for increased ability to lift pots and pans when cooking.  Long Term Goal 3 Progress: Progressing toward goal Long Term Goal 4: Patient will decrease fascial restrictions to minimal in his rightt shoulder region for greater mobility required to raise arm up to reach into overhead cabinets. Long Term Goal 4 Progress: Met Long Term Goal 5: Patient will decrease pain in his right shoulder region to 1/10 when completing desired daily activities.  Long Term Goal 5 Progress: Progressing toward goal  Problem List Patient Active Problem List   Diagnosis Date Noted  . Pain in joint, shoulder region 04/08/2013  . Muscle weakness (generalized) 04/08/2013  . Right rotator cuff tear 02/25/2013  . Gout 08/27/2012  . Foot infection 08/27/2012   . Achilles bursitis or tendinitis 08/27/2012  . Cervicalgia 06/24/2011  . Cervical disc disorder with radiculopathy 06/12/2011    End of Session Activity Tolerance: Patient tolerated treatment well General Behavior During Therapy: [facility] for tasks assessed/performed  GO    Shirlean Mylar, OTR/L   05/27/2013, 3:14 PM  Physician Documentation Your signature is required to indicate approval of the treatment plan as stated above.  Please sign and either send electronically or make a copy of this report for your files and return this physician signed original.  Please mark one 1.__approve of plan  2. ___approve of plan with the following conditions.   ______________________________                                                          _____________________ Physician Signature  Date  

## 2013-06-01 ENCOUNTER — Ambulatory Visit (HOSPITAL_COMMUNITY)
Admission: RE | Admit: 2013-06-01 | Discharge: 2013-06-01 | Disposition: A | Discharge status: Home / Self Care | Payer: Managed Care, Other (non HMO) | Source: Ambulatory Visit

## 2013-06-01 NOTE — Progress Notes (Signed)
Occupational Therapy Treatment Patient Details  Name: Mark Phillips MRN: [id number] Date of Birth: 1971/01/18  Today's Date: 06/01/2013 Time: 1023-1108 OT Time Calculation (min): 45 min Manual therapy 4098-1191 14' Therapeutic exercises 4782-9562  31' Visit#: 14 of 24  Re-eval: 06/24/13    Subjective S:  I went to the MD, and he likes that we are going to start strengthening.  He wants me to continue until I go back to him in 5 weeks.  Limitations: Week 1-4 (5/22-619) Pendulum exercises, Supine PROM to tolerance, Wrist and elbow AROM, isometric supine. Weeks 5-8 (6/19-7/10) Progress PROM to full range, pulleys, may begin AAROM when patient has full pain free arc. Discontinue sling. Weeks 9-12 (7/10-7/31) Progress to AROM, light resistive exercises to tolerance, Theraband, caution unsupported lifting at home. Weeks 13-16 (10/2-10/23) Progress resistive strengthening to tolerance, scapular stabilizing exercise. Pain Assessment Currently in Pain?: No/denies Pain Score: 0-No pain  Precautions/Restrictions     Exercise/Treatments Supine Protraction: PROM;10 reps Protraction Weight (lbs): 2 Horizontal ABduction: PROM;10 reps Horizontal ABduction Weight (lbs): 2 External Rotation: PROM;10 reps External Rotation Weight (lbs): 2 Internal Rotation: PROM;10 reps Internal Rotation Weight (lbs): 2 Flexion: PROM;10 reps Shoulder Flexion Weight (lbs): 2 ABduction: PROM;10 reps Shoulder ABduction Weight (lbs): 2 Seated Protraction: Strengthening;10 reps Protraction Weight (lbs): 2 Horizontal ABduction: Strengthening;10 reps Horizontal ABduction Weight (lbs): 2 External Rotation: Strengthening;10 reps External Rotation Weight (lbs): 2 Internal Rotation: Strengthening;10 reps Internal Rotation Weight (lbs): 2 Flexion: Strengthening;10 reps Flexion Weight (lbs): 2 Abduction: Strengthening;10 reps ABduction Weight (lbs): 2 ROM / Strengthening / Isometric Strengthening UBE (Upper Arm  Bike): 2.0 3' forward and3' reverse Cybex Press: 20 reps;1.5 plate Cybex Row: 20 reps;1.5 plate "W" Arms: 15X with 2# X to V Arms: 15X with 2# Proximal Shoulder Strengthening, Supine: 10X with2# Proximal Shoulder Strengthening, Seated: 10X with 2# Ball on Wall: 1' flexed and 1' abducted  Other ROM/Strengthening Exercises: stocking shelf simulation with 1# weight 53 in 1 min      Manual Therapy Manual Therapy: Myofascial release Myofascial Release: MFR and manual stretching to right upper arm, trapezius, SCM, scapular region,cervical region, manual cervical traction, and shoulder region to decrease fascial restrictions and increase pain free mobility  Occupational Therapy Assessment and Plan OT Assessment and Plan Clinical Impression Statement: A:  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 OT Plan: P:  Resume work hardening, stock 55 cans in 1 minute   Goals Short Term Goals Time to Complete Short Term Goals: 6 weeks Short Term Goal 1: Patient will be educated on HEP.  Short Term Goal 2: Patient will increase right shoulder PROM bto WFL for for greater ease with donning and doffing shirt. Short Term Goal 3: Patient will increase his right shoulder strength to 3+/5 for increased ability to lift pots and pans when cooking. Short Term Goal 4: Patient will decrease fascial restrictions from moderate-max to moderate for greater mobility needed for personal hygiene completion. Short Term Goal 5: Patient will decrease pain to 3/10 in his right shoulder for increased abilty to sleep at night. Long Term Goals Time to Complete Long Term Goals: 12 weeks Long Term Goal 1: Patient will return to highest level of independence for work, leisure, and daily activies.  Long Term Goal 1 Progress: Progressing toward goal Long Term Goal 2: Patient will increase rightt shoulder AROM to WNL for increased abilty to cast fishing rod with more ease. Long Term  Goal 2 Progress:  Progressing toward goal Long Term Goal 3: Patient will increase right shoulder strength to 5/5 for increased ability to lift pots and pans when cooking.  Long Term Goal 3 Progress: Progressing toward goal Long Term Goal 4: Patient will decrease fascial restrictions to minimal in his rightt shoulder region for greater mobility required to raise arm up to reach into overhead cabinets. Long Term Goal 4 Progress: Met Long Term Goal 5: Patient will decrease pain in his right shoulder region to 1/10 when completing desired daily activities.  Long Term Goal 5 Progress: Progressing toward goal  Problem List Patient Active Problem List   Diagnosis Date Noted  . Pain in joint, shoulder region 04/08/2013  . Muscle weakness (generalized) 04/08/2013  . Right rotator cuff tear 02/25/2013  . Gout 08/27/2012  . Foot infection 08/27/2012  . Achilles bursitis or tendinitis 08/27/2012  . Cervicalgia 06/24/2011  . Cervical disc disorder with radiculopathy 06/12/2011    End of Session Activity Tolerance: Patient tolerated treatment well General Behavior During Therapy: [facility] for tasks assessed/performed   Shirlean Mylar, OTR/L  06/01/2013, 11:29 AM

## 2013-06-04 ENCOUNTER — Ambulatory Visit (HOSPITAL_COMMUNITY)
Admission: RE | Admit: 2013-06-04 | Discharge: 2013-06-04 | Disposition: A | Discharge status: Home / Self Care | Payer: Managed Care, Other (non HMO) | Source: Ambulatory Visit | Attending: Internal Medicine | Admitting: Internal Medicine

## 2013-06-04 NOTE — Progress Notes (Signed)
Occupational Therapy Treatment Patient Details  Name: Mark Phillips MRN: [id number] Date of Birth: [birth year]  Today's Date: 06/04/2013 Time: 4098-1191 OT Time Calculation (min): 47 min Manual Therapy 935-949 14' Therapeutic exercises [id number] 67' Visit#: 15 of 24  Re-eval: 06/24/13    Subjective  S:  Its a little achey, but not bad.  Limitations: Week 1-4 (5/22-619) Pendulum exercises, Supine PROM to tolerance, Wrist and elbow AROM, isometric supine. Weeks 5-8 (6/19-7/10) Progress PROM to full range, pulleys, may begin AAROM when patient has full pain free arc. Discontinue sling. Weeks 9-12 (7/10-7/31) Progress to AROM, light resistive exercises to tolerance, Theraband, caution unsupported lifting at home. Weeks 13-16 (8/12-8/23) Progress resistive strengthening to tolerance, scapular stabilizing exercise. Pain Assessment Currently in Pain?: Yes Pain Score: 1  Pain Location: Shoulder Pain Orientation: Right Pain Type: Acute pain  Precautions/Restrictions    Weeks 13-16 (8/12-8/23) Progress resistive strengthening to tolerance, scapular stabilizing exercise.   Exercise/Treatments Supine Protraction: PROM;10 reps;Strengthening;12 reps Protraction Weight (lbs): 2 Horizontal ABduction: PROM;10 reps;Strengthening;12 reps Horizontal ABduction Weight (lbs): 2 External Rotation: PROM;10 reps;Strengthening;12 reps External Rotation Weight (lbs): 2 Internal Rotation: PROM;10 reps;Strengthening;12 reps Internal Rotation Weight (lbs): 2  Flexion: PROM;10 reps;Strengthening;12 reps Shoulder Flexion Weight (lbs): 2 ABduction: PROM;10 reps;Strengthening;12 reps Shoulder ABduction Weight (lbs): 2 Seated Protraction: Strengthening;12 reps Protraction Weight (lbs): 2 Horizontal ABduction: Strengthening;12 reps Horizontal ABduction Weight (lbs): 2 External Rotation: Strengthening;12 reps External Rotation Weight (lbs): 2 Internal Rotation: Strengthening;12 reps Internal Rotation  Weight (lbs): 2 Flexion: Strengthening;12 reps Flexion Weight (lbs): 2 Abduction: Strengthening;12 reps ABduction Weight (lbs): 2   ROM / Strengthening / Isometric Strengthening UBE (Upper Arm Bike): 2.5 forward and reverse 6' total Over Head Lace: 2' with 2# "W" Arms: 15X with 2# X to V Arms: 15X with 2# Proximal Shoulder Strengthening, Supine: 10X with2# Proximal Shoulder Strengthening, Seated: 10X with 2# with arm flexed to 90 and with arm abducted to 90 Ball on Wall: 1' flexed and 1' abducted with 2# weight Other ROM/Strengthening Exercises: stocking shelf simulation with 1# weight 65 in 1 min Other ROM/Strengthening Exercises: overhead press and protraction with green tband 15X      Manual Therapy Manual Therapy: Myofascial release Myofascial Release: MFR and manual stretching to right upper arm, trapezius, SCM, scapular region,cervical region, manual cervical traction, and shoulder region to decrease fascial restrictions and increase pain free mobility  Occupational Therapy Assessment and Plan OT Assessment and Plan Clinical Impression Statement: A:  Improved to 65 cans in 1 minute, previous attempt was 53! 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   Goals Short Term Goals Time to Complete Short Term Goals: 6 weeks Short Term Goal 1: Patient will be educated on HEP.  Short Term Goal 2: Patient will increase right shoulder PROM bto WFL for for greater ease with donning and doffing shirt. Short Term Goal 3: Patient will increase his right shoulder strength to 3+/5 for increased ability to lift pots and pans when cooking. Short Term Goal 4: Patient will decrease fascial restrictions from moderate-max to moderate for greater mobility needed for personal hygiene completion. Short Term Goal 5: Patient will decrease pain to 3/10 in his right shoulder for increased abilty to sleep at night. Long Term Goals Time to Complete Long Term Goals: 12 weeks Long Term Goal 1:  Patient will return to highest level of independence for work, leisure, and daily activies.  Long Term Goal 2: Patient will increase rightt shoulder AROM to WNL for increased abilty  to cast fishing rod with more ease. Long Term Goal 3: Patient will increase right shoulder strength to 5/5 for increased ability to lift pots and pans when cooking.  Long Term Goal 4: Patient will decrease fascial restrictions to minimal in his rightt shoulder region for greater mobility required to raise arm up to reach into overhead cabinets. Long Term Goal 5: Patient will decrease pain in his right shoulder region to 1/10 when completing desired daily activities.   Problem List Patient Active Problem List   Diagnosis Date Noted  . Pain in joint, shoulder region 04/08/2013  . Muscle weakness (generalized) 04/08/2013  . Right rotator cuff tear 02/25/2013  . Gout 08/27/2012  . Foot infection 08/27/2012  . Achilles bursitis or tendinitis 08/27/2012  . Cervicalgia 06/24/2011  . Cervical disc disorder with radiculopathy 06/12/2011    End of Session Activity Tolerance: Patient tolerated treatment well General Behavior During Therapy: [facility] for tasks assessed/performed  GO    Shirlean Mylar, OTR/L  06/04/2013, 10:27 AM

## 2013-06-10 ENCOUNTER — Ambulatory Visit (HOSPITAL_COMMUNITY)
Admission: RE | Admit: 2013-06-10 | Discharge: 2013-06-10 | Disposition: A | Discharge status: Home / Self Care | Payer: Managed Care, Other (non HMO) | Source: Ambulatory Visit | Attending: Specialist | Admitting: Specialist

## 2013-06-10 DIAGNOSIS — M6281 Muscle weakness (generalized): Secondary | ICD-10-CM | POA: Insufficient documentation

## 2013-06-10 DIAGNOSIS — IMO0001 Reserved for inherently not codable concepts without codable children: Secondary | ICD-10-CM | POA: Insufficient documentation

## 2013-06-10 DIAGNOSIS — I10 Essential (primary) hypertension: Secondary | ICD-10-CM | POA: Insufficient documentation

## 2013-06-10 DIAGNOSIS — M25519 Pain in unspecified shoulder: Secondary | ICD-10-CM | POA: Insufficient documentation

## 2013-06-10 NOTE — Progress Notes (Signed)
Occupational Therapy Treatment Patient Details  Name: Mark Phillips MRN: [id number] Date of Birth: [birth year]  Today's Date: 06/10/2013 Time: 1022-1110 OT Time Calculation (min): 48 min Manual Therapy [phone] 33' Therapeutic exercises [phone] 25' Visit#: 16 of 24  Re-eval: 06/24/13     Subjective S:  Its feeling pretty good.  Limitations: Week 1-4 (5/22-619) Pendulum exercises, Supine PROM to tolerance, Wrist and elbow AROM, isometric supine. Weeks 5-8 (6/19-7/10) Progress PROM to full range, pulleys, may begin AAROM when patient has full pain free arc. Discontinue sling. Weeks 9-12 (7/10-7/31) Progress to AROM, light resistive exercises to tolerance, Theraband, caution unsupported lifting at home. Weeks 13-16 (8/2-8/23) Progress resistive strengthening to tolerance, scapular stabilizing exercise. Pain Assessment Currently in Pain?: No/denies Pain Score: 0-No pain  Precautions/Restrictions   progress as tolerated  Exercise/Treatments Supine Protraction: PROM;10 reps;Strengthening;12 reps Protraction Weight (lbs): 2 Horizontal ABduction: PROM;10 reps;Strengthening;12 reps Horizontal ABduction Weight (lbs): 2 External Rotation: PROM;10 reps;Strengthening;12 reps External Rotation Weight (lbs): 2 Internal Rotation: PROM;10 reps;Strengthening;12 reps Internal Rotation Weight (lbs): 2  Flexion: PROM;10 reps;Strengthening;12 reps Shoulder Flexion Weight (lbs): 2 ABduction: PROM;10 reps;Strengthening;12 reps Shoulder ABduction Weight (lbs): 2 Seated Protraction: Strengthening;12 reps Protraction Weight (lbs): 2 Horizontal ABduction: Strengthening;12 reps Horizontal ABduction Weight (lbs): 2 External Rotation: Strengthening;12 reps External Rotation Weight (lbs): 2 Internal Rotation: Strengthening;12 reps Internal Rotation Weight (lbs): 2 Flexion: Strengthening;12 reps Flexion Weight (lbs): 2 Abduction: Strengthening;12 reps ABduction Weight (lbs): 2 ROM / Strengthening  / Isometric Strengthening UBE (Upper Arm Bike): 3.0 3' and 3' Cybex Press: 25 reps;2 plate Cybex Row: 2 plate;25 reps "W" Arms: 15X with 2# X to V Arms: 15X with 2# Other ROM/Strengthening Exercises: stocking shelf simulation with 2# weight 58 in 1 min Other ROM/Strengthening Exercises: overhead press and protraction with green tband 15X      Manual Therapy Manual Therapy: Myofascial release Myofascial Release: MFR and manual stretching to right upper arm, trapezius, SCM, scapular region,cervical region, manual cervical traction, and shoulder region to decrease fascial restrictions and increase pain free mobility  Occupational Therapy Assessment and Plan OT Assessment and Plan Clinical Impression Statement: A:  Increased to 2# with shelf stocking and was able to stock 58 times.   OT Plan: P:  Increse to 3# with supine, seated, and prone strengthening.  Increase to heavy resistance blue theraband with overhead press and chest press.    Goals Short Term Goals Time to Complete Short Term Goals: 6 weeks Short Term Goal 1: Patient will be educated on HEP.  Short Term Goal 2: Patient will increase right shoulder PROM bto WFL for for greater ease with donning and doffing shirt. Short Term Goal 3: Patient will increase his right shoulder strength to 3+/5 for increased ability to lift pots and pans when cooking. Short Term Goal 4: Patient will decrease fascial restrictions from moderate-max to moderate for greater mobility needed for personal hygiene completion. Short Term Goal 5: Patient will decrease pain to 3/10 in his right shoulder for increased abilty to sleep at night. Long Term Goals Time to Complete Long Term Goals: 12 weeks Long Term Goal 1: Patient will return to highest level of independence for work, leisure, and daily activies.  Long Term Goal 1 Progress: Progressing toward goal Long Term Goal 2: Patient will increase rightt shoulder AROM to WNL for increased abilty to cast  fishing rod with more ease. Long Term Goal 2 Progress: Progressing toward goal Long Term Goal 3: Patient will increase right shoulder strength to 5/5 for increased ability  to lift pots and pans when cooking.  Long Term Goal 3 Progress: Progressing toward goal Long Term Goal 4: Patient will decrease fascial restrictions to minimal in his rightt shoulder region for greater mobility required to raise arm up to reach into overhead cabinets. Long Term Goal 4 Progress: Progressing toward goal Long Term Goal 5: Patient will decrease pain in his right shoulder region to 1/10 when completing desired daily activities.  Long Term Goal 5 Progress: Progressing toward goal  Problem List Patient Active Problem List   Diagnosis Date Noted  . Pain in joint, shoulder region 04/08/2013  . Muscle weakness (generalized) 04/08/2013  . Right rotator cuff tear 02/25/2013  . Gout 08/27/2012  . Foot infection 08/27/2012  . Achilles bursitis or tendinitis 08/27/2012  . Cervicalgia 06/24/2011  . Cervical disc disorder with radiculopathy 06/12/2011    End of Session Activity Tolerance: Patient tolerated treatment well General Behavior During Therapy: [facility] for tasks assessed/performed  GO    Shirlean Mylar, OTR/L  06/10/2013, 11:11 AM

## 2013-06-15 ENCOUNTER — Ambulatory Visit (HOSPITAL_COMMUNITY): Payer: Managed Care, Other (non HMO) | Admitting: Specialist

## 2013-06-15 ENCOUNTER — Telehealth (HOSPITAL_COMMUNITY): Payer: Self-pay

## 2013-06-17 ENCOUNTER — Ambulatory Visit (HOSPITAL_COMMUNITY): Payer: Managed Care, Other (non HMO) | Admitting: Specialist

## 2013-06-22 ENCOUNTER — Ambulatory Visit (HOSPITAL_COMMUNITY)
Admission: RE | Admit: 2013-06-22 | Discharge: 2013-06-22 | Disposition: A | Discharge status: Home / Self Care | Payer: Managed Care, Other (non HMO) | Source: Ambulatory Visit | Attending: Internal Medicine | Admitting: Internal Medicine

## 2013-06-22 NOTE — Progress Notes (Signed)
Occupational Therapy Treatment Patient Details  Name: Mark Phillips MRN: [id number] Date of Birth: [birth year]  Today's Date: 06/22/2013 Time: 8295-6213 OT Time Calculation (min): 45 min Manual therapy  [phone] 13' Therapeutic Exercises [phone] 32' Visit#: 17 of 24  Re-eval: 07/02/13    ubjective  S:  It was a little nagging pain yesterday.  Limitations: Week 1-4 (5/22-619) Pendulum exercises, Supine PROM to tolerance, Wrist and elbow AROM, isometric supine. Weeks 5-8 (6/19-7/10) Progress PROM to full range, pulleys, may begin AAROM when patient has full pain free arc. Discontinue sling. Weeks 9-12 (7/10-7/31) Progress to AROM, light resistive exercises to tolerance, Theraband, caution unsupported lifting at home. Weeks 13-16 (8/2-8/23) Progress resistive strengthening to tolerance, scapular stabilizing exercise. Pain Assessment Currently in Pain?: No/denies Pain Score: 0-No pain  Precautions/Restrictions   progress as tolerated  Exercise/Treatments Supine Protraction: PROM;Strengthening;10 reps Protraction Weight (lbs): 3 Horizontal ABduction: PROM;Strengthening;10 reps Horizontal ABduction Weight (lbs): 3 External Rotation: PROM;Strengthening;10 reps External Rotation Weight (lbs): 3 Internal Rotation: PROM;Strengthening;10 reps Internal Rotation Weight (lbs): 3 Flexion: PROM;Strengthening;10 reps Shoulder Flexion Weight (lbs): 3 ABduction: PROM;Strengthening;10 reps Shoulder ABduction Weight (lbs): 3 Seated Protraction: Strengthening;10 reps Protraction Weight (lbs): 3 Horizontal ABduction: Strengthening;10 reps Horizontal ABduction Weight (lbs): 3 External Rotation: Strengthening;10 reps External Rotation Weight (lbs): 3 Internal Rotation: Strengthening;10 reps Internal Rotation Weight (lbs): 3 Flexion: Strengthening;10 reps Flexion Weight (lbs): 3 Abduction: Strengthening;10 reps ABduction Weight (lbs): 3 Prone  Retraction: Strengthening;10 reps Retraction  Weight (lbs): 3 Flexion: Strengthening;10 reps Flexion Weight (lbs): 3 Extension: Strengthening;10 reps Extension Weight (lbs): 3 Horizontal ABduction 1: Strengthening;10 reps Horizontal ABduction 1 Weight (lbs): 3 Horizontal ABduction 2: Strengthening;10 reps Horizontal ABduction 2 Weight (lbs): 3 ROM / Strengthening / Isometric Strengthening UBE (Upper Arm Bike): omit today "W" Arms: 10X with 3# X to V Arms: 10X with 3# Proximal Shoulder Strengthening, Seated: 10X with 3# Other ROM/Strengthening Exercises: stocking shelf simulation with 3# weight 50 times in 45" Other ROM/Strengthening Exercises: pushed sled around gym one time and then pulled sled around gym one time       Manual Therapy Manual Therapy: Myofascial release Myofascial Release: MFR and manual stretching to right upper arm, trapezius, SCM, scapular region,cervical region, manual cervical traction, and shoulder region to decrease fascial restrictions and increase pain free mobility.  Occupational Therapy Assessment and Plan OT Assessment and Plan Clinical Impression Statement: A:  Increased to 3# with supine, seated, and prone.  Required max tactile cues to depress shoulder blade during horiz abd in seated.  OT Plan: P:  increase times around gym with sled.  Resume tband exercises.   Goals Short Term Goals Time to Complete Short Term Goals: 6 weeks Short Term Goal 1: Patient will be educated on HEP.  Short Term Goal 2: Patient will increase right shoulder PROM bto WFL for for greater ease with donning and doffing shirt. Short Term Goal 3: Patient will increase his right shoulder strength to 3+/5 for increased ability to lift pots and pans when cooking. Short Term Goal 4: Patient will decrease fascial restrictions from moderate-max to moderate for greater mobility needed for personal hygiene completion. Short Term Goal 5: Patient will decrease pain to 3/10 in his right shoulder for increased abilty to sleep at  night. Long Term Goals Time to Complete Long Term Goals: 12 weeks Long Term Goal 1: Patient will return to highest level of independence for work, leisure, and daily activies.  Long Term Goal 1 Progress: Progressing toward goal Long Term Goal 2: Patient will  increase rightt shoulder AROM to WNL for increased abilty to cast fishing rod with more ease. Long Term Goal 2 Progress: Progressing toward goal Long Term Goal 3: Patient will increase right shoulder strength to 5/5 for increased ability to lift pots and pans when cooking.  Long Term Goal 3 Progress: Progressing toward goal Long Term Goal 4: Patient will decrease fascial restrictions to minimal in his rightt shoulder region for greater mobility required to raise arm up to reach into overhead cabinets. Long Term Goal 4 Progress: Progressing toward goal Long Term Goal 5: Patient will decrease pain in his right shoulder region to 1/10 when completing desired daily activities.  Long Term Goal 5 Progress: Progressing toward goal  Problem List Patient Active Problem List   Diagnosis Date Noted  . Pain in joint, shoulder region 04/08/2013  . Muscle weakness (generalized) 04/08/2013  . Right rotator cuff tear 02/25/2013  . Gout 08/27/2012  . Foot infection 08/27/2012  . Achilles bursitis or tendinitis 08/27/2012  . Cervicalgia 06/24/2011  . Cervical disc disorder with radiculopathy 06/12/2011    End of Session Activity Tolerance: Patient tolerated treatment well General Behavior During Therapy: [facility] for tasks assessed/performed  GO    Shirlean Mylar, OTR/L  06/22/2013, 10:27 AM

## 2013-06-24 ENCOUNTER — Ambulatory Visit (HOSPITAL_COMMUNITY)
Admission: RE | Admit: 2013-06-24 | Discharge: 2013-06-24 | Disposition: A | Discharge status: Home / Self Care | Payer: Managed Care, Other (non HMO) | Source: Ambulatory Visit | Attending: Internal Medicine | Admitting: Internal Medicine

## 2013-06-24 NOTE — Progress Notes (Signed)
Occupational Therapy Treatment Patient Details  Name: Mark Phillips MRN: [id number] Date of Birth: 03/24/1971  Today's Date: 06/24/2013 Time: 4098-1191 OT Time Calculation (min): 45 min Manual Therapy [phone] 15' Therapeutic exercises [phone] 30' Visit#: 18 of 24  Re-eval: 07/02/13     Subjective S:  It feels pretty good today. Limitations: Week 1-4 (5/22-619) Pendulum exercises, Supine PROM to tolerance, Wrist and elbow AROM, isometric supine. Weeks 5-8 (6/19-7/10) Progress PROM to full range, pulleys, may begin AAROM when patient has full pain free arc. Discontinue sling. Weeks 9-12 (7/10-7/31) Progress to AROM, light resistive exercises to tolerance, Theraband, caution unsupported lifting at home. Weeks 13-16 (8/2-8/23) Progress resistive strengthening to tolerance, scapular stabilizing exercise. Pain Assessment Currently in Pain?: No/denies  Precautions/Restrictions   progress as tolerated  Exercise/Treatments Supine Protraction: PROM;10 reps Horizontal ABduction: PROM;10 reps External Rotation: PROM;10 reps Internal Rotation: PROM;10 reps Flexion: PROM;10 reps ABduction: PROM;10 reps Seated Protraction: Strengthening;15 reps Protraction Weight (lbs): 3 Horizontal ABduction: Strengthening;15 reps Horizontal ABduction Weight (lbs): 3 External Rotation: Strengthening;15 reps External Rotation Weight (lbs): 3 Internal Rotation: Strengthening;15 reps Internal Rotation Weight (lbs): 3 Flexion: Strengthening;15 reps Flexion Weight (lbs): 3 Abduction: Strengthening;15 reps ABduction Weight (lbs): 3 Prone  Retraction: Strengthening;15 reps Retraction Weight (lbs): 3 Flexion: Strengthening;15 reps Flexion Weight (lbs): 3 Extension: Strengthening;15 reps Extension Weight (lbs): 3 Horizontal ABduction 1: Strengthening;15 reps Horizontal ABduction 1 Weight (lbs): 3 Horizontal ABduction 2: Strengthening;15 reps Horizontal ABduction 2 Weight (lbs): 3 Standing Other  Standing Exercises: millitary press and overhead press with green tband x 15 ROM / Strengthening / Isometric Strengthening UBE (Upper Arm Bike): 3' and 3' 3.5 Cybex Press: 2.5 plate;20 reps Cybex Row: 2.5 plate;20 reps "W" Arms: 15X with 3# X to V Arms: 15X with 3# Proximal Shoulder Strengthening, Seated: 10 X with 3# Graduated Retraction with Theraband: 3 times with blue tband Sustained Retraction with Theraband: 3 time with blue tband  Other ROM/Strengthening Exercises: angel wings       Manual Therapy Manual Therapy: Myofascial release Myofascial Release: MFR and manual stretching to right upper arm, trapezius, SCM, scapular region,cervical region, manual cervical traction, and shoulder region to decrease fascial restrictions and increase pain free mobility.  Occupational Therapy Assessment and Plan OT Assessment and Plan Clinical Impression Statement: A:  Added angel wings and graduated and sustained tband exercises.  Less vg required for depressing shoulder with horizontal abduction.  OT Plan: P:  Add addtional work hardening activities.    Goals Short Term Goals Time to Complete Short Term Goals: 6 weeks Short Term Goal 1: Patient will be educated on HEP.  Short Term Goal 2: Patient will increase right shoulder PROM bto WFL for for greater ease with donning and doffing shirt. Short Term Goal 3: Patient will increase his right shoulder strength to 3+/5 for increased ability to lift pots and pans when cooking. Short Term Goal 4: Patient will decrease fascial restrictions from moderate-max to moderate for greater mobility needed for personal hygiene completion. Short Term Goal 5: Patient will decrease pain to 3/10 in his right shoulder for increased abilty to sleep at night. Long Term Goals Time to Complete Long Term Goals: 12 weeks Long Term Goal 1: Patient will return to highest level of independence for work, leisure, and daily activies.  Long Term Goal 2: Patient will increase  rightt shoulder AROM to WNL for increased abilty to cast fishing rod with more ease. Long Term Goal 3: Patient will increase right shoulder strength to 5/5 for increased ability  to lift pots and pans when cooking.  Long Term Goal 4: Patient will decrease fascial restrictions to minimal in his rightt shoulder region for greater mobility required to raise arm up to reach into overhead cabinets. Long Term Goal 5: Patient will decrease pain in his right shoulder region to 1/10 when completing desired daily activities.   Problem List Patient Active Problem List   Diagnosis Date Noted  . Pain in joint, shoulder region 04/08/2013  . Muscle weakness (generalized) 04/08/2013  . Right rotator cuff tear 02/25/2013  . Gout 08/27/2012  . Foot infection 08/27/2012  . Achilles bursitis or tendinitis 08/27/2012  . Cervicalgia 06/24/2011  . Cervical disc disorder with radiculopathy 06/12/2011    End of Session Activity Tolerance: Patient tolerated treatment well General Behavior During Therapy: [facility] for tasks assessed/performed  GO    Jacqualine Code 06/24/2013, 10:19 AM

## 2013-06-29 ENCOUNTER — Ambulatory Visit (HOSPITAL_COMMUNITY)
Admission: RE | Admit: 2013-06-29 | Discharge: 2013-06-29 | Disposition: A | Discharge status: Home / Self Care | Payer: Managed Care, Other (non HMO) | Source: Ambulatory Visit | Attending: Internal Medicine | Admitting: Internal Medicine

## 2013-06-29 NOTE — Progress Notes (Signed)
Occupational Therapy Treatment Patient Details  Name: Mark Phillips MRN: [id number] Date of Birth: [birth year]  Today's Date: 06/29/2013 Time: 4098-1191 OT Time Calculation (min): 43 min Manual Therapy [phone] 10' Therapeutic exercises [phone] Visit#: 19 of 24  Re-eval: 07/02/13    Authorization:    Authorization Time Period:    Authorization Visit#:   of    Subjective Symptoms/Limitations Symptoms: S:  Its pretty good.   Pain Assessment Currently in Pain?: No/denies Pain Score: 0-No pain  Precautions/Restrictions     Exercise/Treatments Supine Protraction: PROM;10 reps Horizontal ABduction: PROM;10 reps External Rotation: PROM;10 reps Internal Rotation: PROM;10 reps Flexion: PROM;10 reps ABduction: PROM;10 reps Seated Protraction: Strengthening;15 reps Protraction Weight (lbs): 3 Horizontal ABduction: Strengthening;15 reps Horizontal ABduction Weight (lbs): 3 External Rotation: Strengthening;15 reps External Rotation Weight (lbs): 3 Internal Rotation: Strengthening;15 reps Internal Rotation Weight (lbs): 3 Flexion: Strengthening;15 reps Flexion Weight (lbs): 3 Abduction: Strengthening;15 reps ABduction Weight (lbs): 3 Prone  Retraction: Strengthening;15 reps Retraction Weight (lbs): 3 Flexion: Strengthening;15 reps Flexion Weight (lbs): 3 Extension: Strengthening;15 reps Extension Weight (lbs): 3 Horizontal ABduction 1: Strengthening;15 reps Horizontal ABduction 1 Weight (lbs): 3 Horizontal ABduction 2: Strengthening;15 reps Horizontal ABduction 2 Weight (lbs): 3 Standing Other Standing Exercises: millitary press and overhead press with green tband x 15 ROM / Strengthening / Isometric Strengthening UBE (Upper Arm Bike): 3' and 3' 3.5 Cybex Press: 2.5 plate;20 reps Cybex Row: 2.5 plate;20 reps "W" Arms: 15X with 3# X to V Arms: 15X with 3# Proximal Shoulder Strengthening, Seated: 10 X with 3# Graduated Retraction with Theraband: 3 times with  blue tband Sustained Retraction with Theraband: 3 time with blue tband  Other ROM/Strengthening Exercises: stocking shelf simulation with 3# weight 68 times in 1' Other ROM/Strengthening Exercises: angel wings  10 times       Manual Therapy Manual Therapy: Myofascial release Myofascial Release: MFR and manual stretching to right upper arm, trapezius, SCM, scapular region,cervical region, manual cervical traction, and shoulder region to decrease fascial restrictions and increase pain free mobility.  Occupational Therapy Assessment and Plan OT Assessment and Plan Clinical Impression Statement: A:  Able to use a 3# weight to simulate stocking shelves 68 times in one minute.  OT Plan: P:  Reassess for possible dc.   Goals Short Term Goals Time to Complete Short Term Goals: 6 weeks Short Term Goal 1: Patient will be educated on HEP.  Short Term Goal 2: Patient will increase right shoulder PROM bto WFL for for greater ease with donning and doffing shirt. Short Term Goal 3: Patient will increase his right shoulder strength to 3+/5 for increased ability to lift pots and pans when cooking. Short Term Goal 4: Patient will decrease fascial restrictions from moderate-max to moderate for greater mobility needed for personal hygiene completion. Short Term Goal 5: Patient will decrease pain to 3/10 in his right shoulder for increased abilty to sleep at night. Long Term Goals Time to Complete Long Term Goals: 12 weeks Long Term Goal 1: Patient will return to highest level of independence for work, leisure, and daily activies.  Long Term Goal 2: Patient will increase rightt shoulder AROM to WNL for increased abilty to cast fishing rod with more ease. Long Term Goal 3: Patient will increase right shoulder strength to 5/5 for increased ability to lift pots and pans when cooking.  Long Term Goal 4: Patient will decrease fascial restrictions to minimal in his rightt shoulder region for greater mobility  required to raise arm up to reach  into overhead cabinets. Long Term Goal 5: Patient will decrease pain in his right shoulder region to 1/10 when completing desired daily activities.   Problem List Patient Active Problem List   Diagnosis Date Noted  . Pain in joint, shoulder region 04/08/2013  . Muscle weakness (generalized) 04/08/2013  . Right rotator cuff tear 02/25/2013  . Gout 08/27/2012  . Foot infection 08/27/2012  . Achilles bursitis or tendinitis 08/27/2012  . Cervicalgia 06/24/2011  . Cervical disc disorder with radiculopathy 06/12/2011    End of Session Activity Tolerance: Patient tolerated treatment well  GO    Shirlean Mylar, OTR/L  06/29/2013, 10:51 AM

## 2013-07-01 ENCOUNTER — Ambulatory Visit (HOSPITAL_COMMUNITY)
Admission: RE | Admit: 2013-07-01 | Discharge: 2013-07-01 | Disposition: A | Discharge status: Home / Self Care | Payer: Managed Care, Other (non HMO) | Source: Ambulatory Visit | Attending: Internal Medicine | Admitting: Internal Medicine

## 2013-07-01 NOTE — Evaluation (Signed)
Occupational Therapy Reassessment and Discharge  Patient Details  Name: Mark Phillips MRN: [id number] Date of Birth: 1971-06-26  Today's Date: 07/01/2013 Time: 0935-1000 OT Time Calculation (min): 25 min MFR [phone] 14' Reassess [phone] 11'  Visit#: 20 of 24  Re-eval: 07/02/13  Assessment Diagnosis: Right shoulder rotator cuff repair  Authorization:    Authorization Time Period:    Authorization Visit#:   of     Past Medical History:  Past Medical History  Diagnosis Date  . Hypertension   . High cholesterol   . Anginal pain     2010  . GERD (gastroesophageal reflux disease)     hx of  . Arthritis   . PONV (postoperative nausea and vomiting)   . Sleep apnea    Past Surgical History:  Past Surgical History  Procedure Laterality Date  . Colon surgery    . Ankle reconstruction      lt reconstruction and rt scope sx  . Appendectomy  2000  . Wisdom tooth extraction  2011  . Shoulder arthroscopy with subacromial decompression and open rotator c Right 02/25/2013    Procedure: RIGHT SHOULDER ARTHROSCOPY WITH SUBACROMIAL DECOMPRESSION AND MINI OPEN ROTATOR CUFF REPAIR;  Surgeon: Javier Docker, MD;  Location: [facility];  Service: Orthopedics;  Laterality: Right;    Subjective Symptoms/Limitations Symptoms: S: I'm doing everything as I normally would be doing. I see the doctor tomorrow and I bet he'll clear me to return to work.  Special Tests: DASH was 31.81 and is currently 24. Pain Assessment Currently in Pain?: No/denies  Precautions/Restrictions  Precautions Precautions: Shoulder Type of Shoulder Precautions: Week 1-4 (7/3-7/31) Pendulum exercises, Supine PROM to tolerance, Wrist and elbow AROM, isometric supine. Weeks 5-8 (8/7-8/28) Progress PROM to full range, pulleys, may begin AAROM when patient has full pain free arc. Discontinue sling. Weeks 9-12 (9/4-9/25) Progress to AROM, light resistive exercises to tolerance, Theraband, caution unsupported lifting at home.  Weeks 13-16 (10/2-10/23) Progress resistive strengthening to tolerance, scapular stabilizing exercise.   Assessment Additional Assessments RUE AROM (degrees) RUE Overall AROM Comments: assessed in seated  Right Shoulder Flexion: 165 Degrees (last progress note: 160) Right Shoulder ABduction: 160 Degrees (last progress note: 155) Right Shoulder Internal Rotation: 90 Degrees (same at last progress note) Right Shoulder External Rotation: 70 Degrees (last progress note: 60) RUE Strength RUE Overall Strength Comments: last progress note: 4+/5 Right Shoulder Flexion: 5/5 Right Shoulder ABduction: 5/5 Right Shoulder Internal Rotation: 5/5 Right Shoulder External Rotation: 5/5 Palpation Palpation: min fascia restrictions in right upper arm, trapezius, and scapualris region.      Exercise/Treatments    Manual Therapy Manual Therapy: Myofascial release Myofascial Release: MFR and manual stretching to right upper arm, trapezius, SCM, scapular region,cervical region, manual cervical traction, and shoulder region to decrease fascial restrictions and increase pain free mobility  Occupational Therapy Assessment and Plan OT Assessment and Plan Clinical Impression Statement: A: Patient has met all therapy goals and is ready for discharge. See MD note for progress.  OT Plan: P: D/C from therapy.   Goals Short Term Goals Time to Complete Short Term Goals: 6 weeks Short Term Goal 1: Patient will be educated on HEP.  Short Term Goal 2: Patient will increase right shoulder PROM bto WFL for for greater ease with donning and doffing shirt. Short Term Goal 3: Patient will increase his right shoulder strength to 3+/5 for increased ability to lift pots and pans when cooking. Short Term Goal 4: Patient will decrease fascial restrictions from  moderate-max to moderate for greater mobility needed for personal hygiene completion. Short Term Goal 5: Patient will decrease pain to 3/10 in his right shoulder for  increased abilty to sleep at night. Long Term Goals Time to Complete Long Term Goals: 12 weeks Long Term Goal 1: Patient will return to highest level of independence for work, leisure, and daily activies.  Long Term Goal 1 Progress: Met Long Term Goal 2: Patient will increase rightt shoulder AROM to WNL for increased abilty to cast fishing rod with more ease. Long Term Goal 2 Progress: Met Long Term Goal 3: Patient will increase right shoulder strength to 5/5 for increased ability to lift pots and pans when cooking.  Long Term Goal 3 Progress: Met Long Term Goal 4: Patient will decrease fascial restrictions to minimal in his rightt shoulder region for greater mobility required to raise arm up to reach into overhead cabinets. Long Term Goal 4 Progress: Met Long Term Goal 5: Patient will decrease pain in his right shoulder region to 1/10 when completing desired daily activities.  Long Term Goal 5 Progress: Met  Problem List Patient Active Problem List   Diagnosis Date Noted  . Pain in joint, shoulder region 04/08/2013  . Muscle weakness (generalized) 04/08/2013  . Right rotator cuff tear 02/25/2013  . Gout 08/27/2012  . Foot infection 08/27/2012  . Achilles bursitis or tendinitis 08/27/2012  . Cervicalgia 06/24/2011  . Cervical disc disorder with radiculopathy 06/12/2011    End of Session Activity Tolerance: Patient tolerated treatment well OT Plan of Care OT Home Exercise Plan: Issued green theraband to use at home with HEP. Consulted and Agree with Plan of Care: Patient   Limmie Patricia, OTR/L,CBIS   07/01/2013, 10:09 AM  Physician Documentation Your signature is required to indicate approval of the treatment plan as stated above.  Please sign and either send electronically or make a copy of this report for your files and return this physician signed original.  Please mark one 1.__approve of plan  2. ___approve of plan with the following  conditions.   ______________________________                                                          _____________________ Physician Signature                                                                                                             Date

## 2013-09-04 IMAGING — CR DG ANKLE COMPLETE 3+V*L*
3 series · 3 of 3 positions shown · non-contrast
Comparison: None

CLINICAL DATA: Left ankle pain and swelling.

LEFT ANKLE COMPLETE - 3+ VIEW

[view not recorded (1 of 3)]
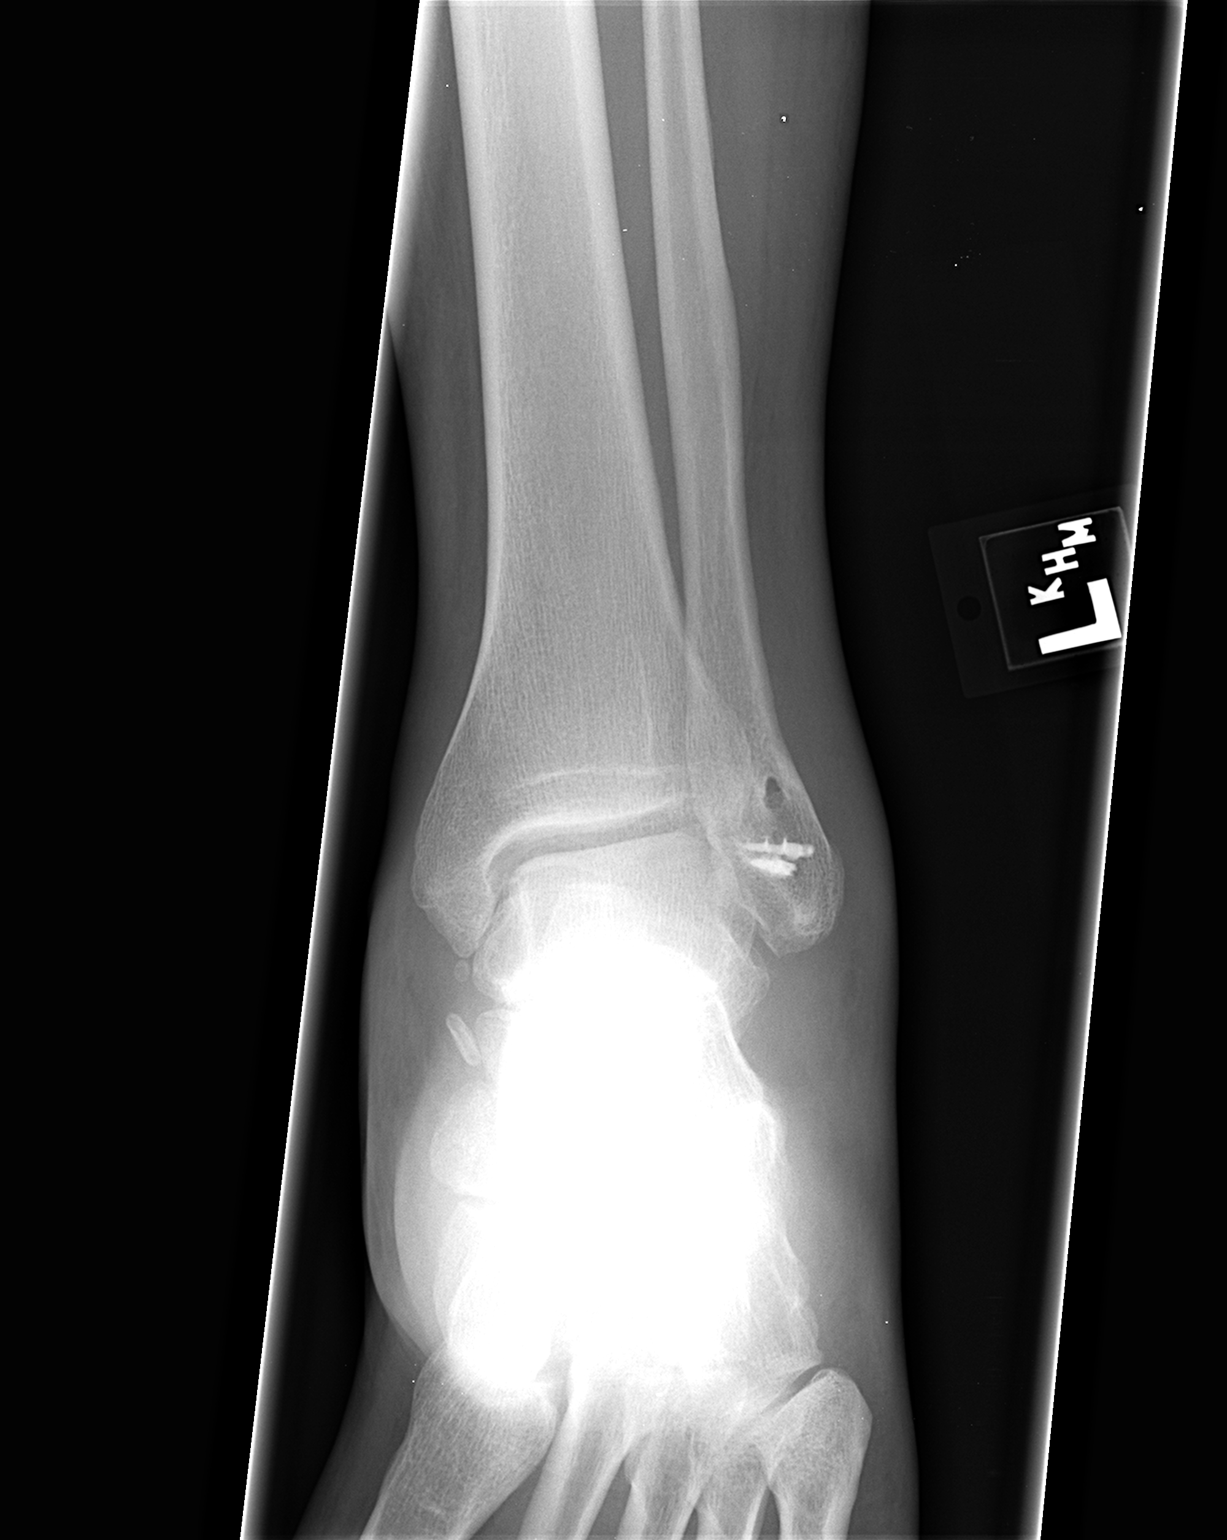

[view not recorded (2 of 3)]
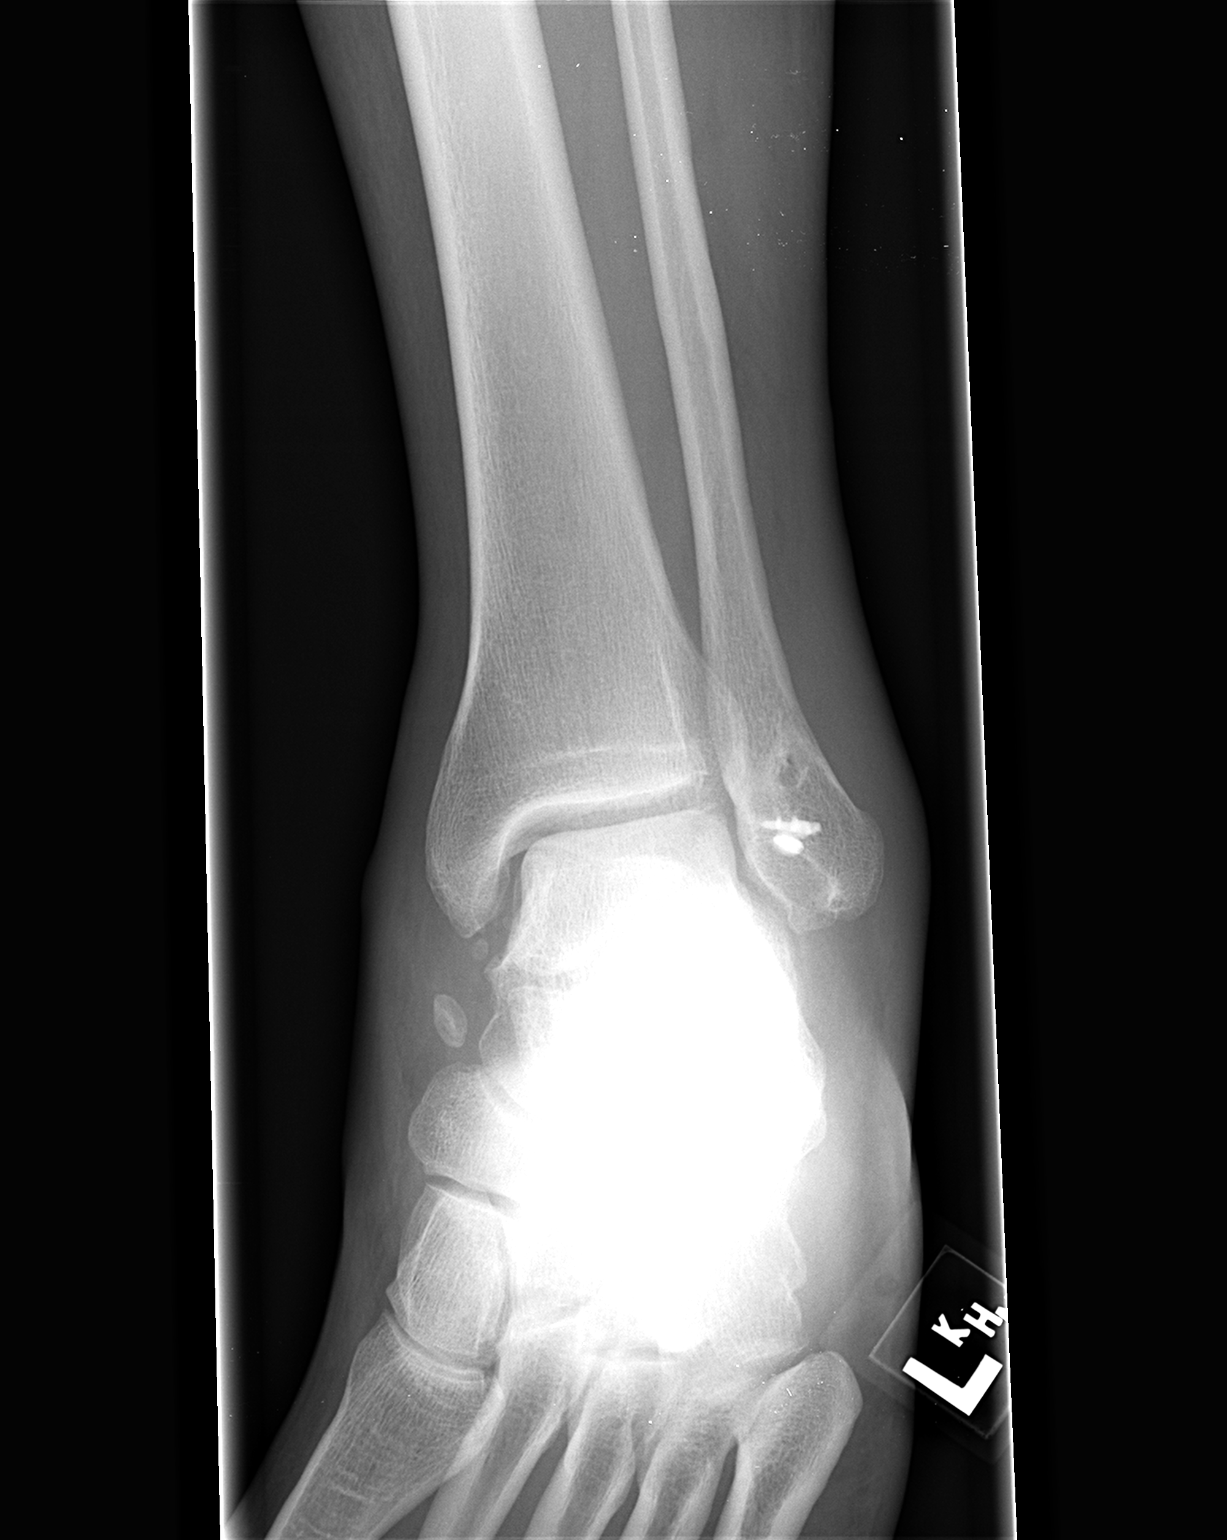

[view not recorded (3 of 3)]
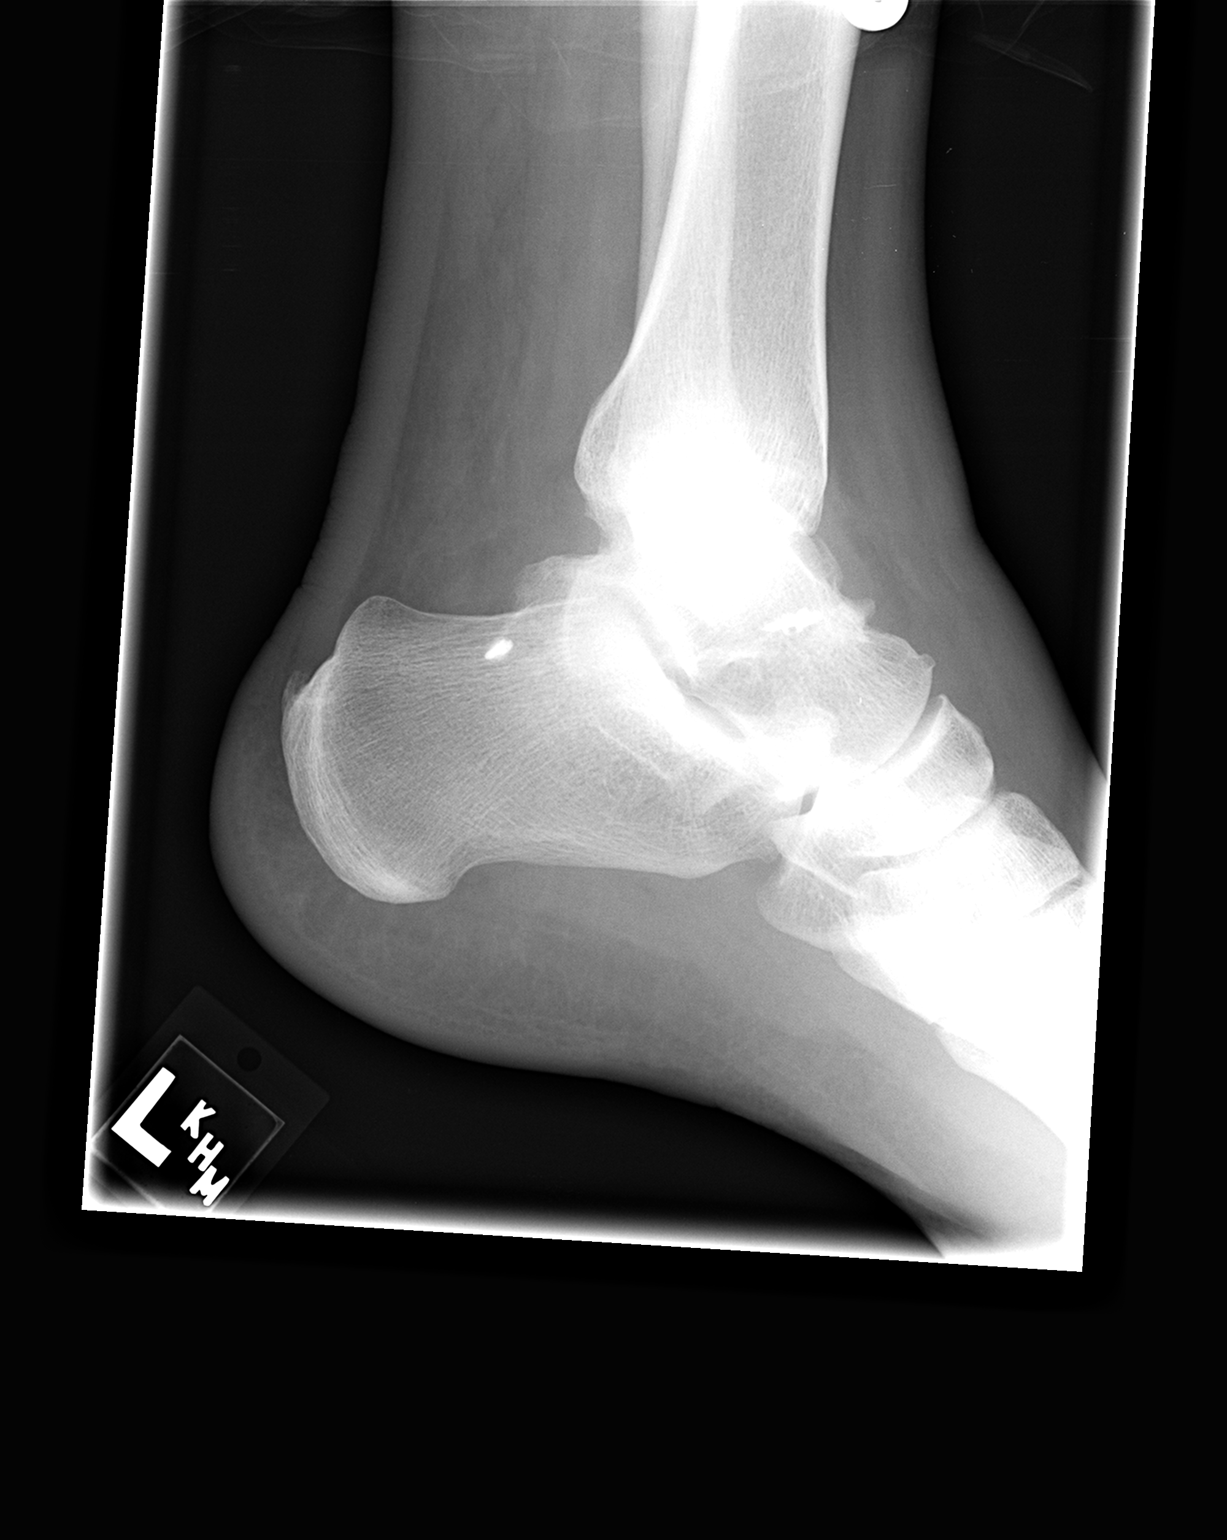

[3 of 3 positions shown; findings below may reference images not displayed]

FINDINGS: The ankle mortise is maintained.  No acute ankle
fracture.  There are multiple small screw fragments in the fibula,
talus and calcaneus along with remote screw holes in the fibula.
Mild tibiotalar joint degenerative changes.  No osteochondral
lesion.  The visualized mid and hind foot bony structures are
intact.
IMPRESSION: Remote post-traumatic and postsurgical changes.  No acute bony
findings.

## 2013-12-26 ENCOUNTER — Encounter (HOSPITAL_COMMUNITY): Payer: Self-pay | Admitting: Emergency Medicine

## 2013-12-26 ENCOUNTER — Emergency Department (HOSPITAL_COMMUNITY)
Admission: EM | Admit: 2013-12-26 | Discharge: 2013-12-26 | Disposition: A | Discharge status: Home / Self Care | Payer: Managed Care, Other (non HMO) | Attending: Emergency Medicine | Admitting: Emergency Medicine

## 2013-12-26 DIAGNOSIS — M549 Dorsalgia, unspecified: Secondary | ICD-10-CM | POA: Insufficient documentation

## 2013-12-26 DIAGNOSIS — I10 Essential (primary) hypertension: Secondary | ICD-10-CM | POA: Insufficient documentation

## 2013-12-26 DIAGNOSIS — Z8719 Personal history of other diseases of the digestive system: Secondary | ICD-10-CM | POA: Insufficient documentation

## 2013-12-26 DIAGNOSIS — R079 Chest pain, unspecified: Secondary | ICD-10-CM | POA: Insufficient documentation

## 2013-12-26 DIAGNOSIS — Z8739 Personal history of other diseases of the musculoskeletal system and connective tissue: Secondary | ICD-10-CM | POA: Insufficient documentation

## 2013-12-26 DIAGNOSIS — M25519 Pain in unspecified shoulder: Secondary | ICD-10-CM | POA: Insufficient documentation

## 2013-12-26 DIAGNOSIS — M25511 Pain in right shoulder: Secondary | ICD-10-CM

## 2013-12-26 DIAGNOSIS — F17200 Nicotine dependence, unspecified, uncomplicated: Secondary | ICD-10-CM | POA: Insufficient documentation

## 2013-12-26 MED ORDER — HYDROCODONE-ACETAMINOPHEN 5-325 MG PO TABS: 1.0000 | ORAL_TABLET | ORAL | Status: DC | PRN

## 2013-12-26 MED ORDER — IBUPROFEN 600 MG PO TABS: 600.0000 mg | ORAL_TABLET | Freq: Four times a day (QID) | ORAL | Status: DC | PRN

## 2013-12-26 MED ORDER — CYCLOBENZAPRINE HCL 10 MG PO TABS: 10.0000 mg | ORAL_TABLET | Freq: Two times a day (BID) | ORAL | Status: DC | PRN

## 2013-12-26 NOTE — Discharge Instructions (Signed)
Acromioclavicular Injuries The acromioclavicular [facility]) joint is the joint in the shoulder. There are many bands of tissue (ligaments) that surround the [facility] bones and joints. These bands of tissue can tear, which can lead to sprains and separations. The bones of the [facility] joint can also break (fracture).  HOME CARE   Put ice on the injured area.  Put ice in a plastic bag.  Place a towel between your skin and the bag.  Leave the ice on for 15-20 minutes, 03-04 times a day.  Wear your sling as told by your doctor. Remove the sling before showering and bathing. Keep the shoulder in the same place as when the sling is on. Do not lift the arm.  Gently tighten your figure-eight splint (if applied) every day. Tighten it enough to keep the shoulders held back. There should be room to place your finger between your body and the strap. Loosen the splint right away if you lose feeling (numbness) or have tingling in your hands.  Only take medicine as told by your doctor.  Keep all follow-up visits with your doctor. GET HELP RIGHT AWAY IF:   Your medicine does not help your pain.  You have more puffiness (swelling) or your bruising gets worse rather than better.  You were unable to follow up as told by your doctor.  You have tingling or lose even more feeling in your arm, forearm, or hand.  Your arm is cold or pale.  You have more pain in the hand, forearm, or fingers. MAKE SURE YOU:   Understand these instructions.  Will watch your condition.  Will get help right away if you are not doing well or get worse. Document Released: 03/13/2010 Document Revised: 12/16/2011 Document Reviewed: 03/13/2010 [facility] Patient Information 2014 [place].

## 2013-12-26 NOTE — ED Notes (Signed)
Pt reports rolled over in bed last night and felt something "pop" in r shoulder blade.  Pt says hurts to take a deep breath.

## 2013-12-26 NOTE — ED Provider Notes (Signed)
CSN: [id number]     Arrival date & time 12/26/13  1245 History   First MD Initiated Contact with Patient 12/26/13 1319     Chief Complaint  Patient presents with  . Shoulder Pain     (Consider location/radiation/quality/duration/timing/severity/associated sxs/prior Treatment) HPI Pt is a 43yo male with hx of right rotator cuff tear last year. Was released from orthopedist for injury in Sept 2014.  Today, pt c/o worsening right shoulder pain after he rolled over in bed last night and felt something "pop."  Pt states it feels like a pulled muscle, hurts with deep breathing. Pain is constant, 6/10, aching. Pt took 2 hydrocodone earlier today that he had from previous prescription. Does report some relief with medication. Pt no longer has orthopedist. Denies numbness or tingling in arm or hand. Denies other falls or recent trauma. Denies fever. Denies skin changes. Denies SOB.  Past Medical History  Diagnosis Date  . Hypertension   . High cholesterol   . Anginal pain     2010  . GERD (gastroesophageal reflux disease)     hx of  . Arthritis   . PONV (postoperative nausea and vomiting)   . Sleep apnea    Past Surgical History  Procedure Laterality Date  . Colon surgery    . Ankle reconstruction      lt reconstruction and rt scope sx  . Appendectomy  2000  . Wisdom tooth extraction  2011  . Shoulder arthroscopy with subacromial decompression and open rotator c Right 02/25/2013    Procedure: RIGHT SHOULDER ARTHROSCOPY WITH SUBACROMIAL DECOMPRESSION AND MINI OPEN ROTATOR CUFF REPAIR;  Surgeon: Johnn Hai, MD;  Location: [facility];  Service: Orthopedics;  Laterality: Right;   Family History  Problem Relation Age of Onset  . Heart disease    . Arthritis    . Lung disease    . Cancer    . Asthma    . Diabetes     History  Substance Use Topics  . Smoking status: Current Every Day Smoker -- 1.00 packs/day for 19 years    Types: Cigarettes  . Smokeless tobacco: Former Systems developer   Types: [place] date: 10/07/1993  . Alcohol Use: No    Review of Systems  Respiratory: Negative for shortness of breath.   Cardiovascular: Positive for chest pain.  Musculoskeletal: Positive for arthralgias, back pain and myalgias.       Right shoulder and right upper back  Skin: Negative for rash and wound.  Neurological: Negative for weakness and numbness.  All other systems reviewed and are negative.      Allergies  Albuterol  Home Medications   Current Outpatient Rx  Name  Route  Sig  Dispense  Refill  . HYDROCODONE-ACETAMINOPHEN PO   Oral   Take 2 tablets by mouth daily as needed (pain).         . cyclobenzaprine (FLEXERIL) 10 MG tablet   Oral   Take 1 tablet (10 mg total) by mouth 2 (two) times daily as needed for muscle spasms.   20 tablet   0   . HYDROcodone-acetaminophen (NORCO/VICODIN) 5-325 MG per tablet   Oral   Take 1-2 tablets by mouth every 4 (four) hours as needed.   6 tablet   0   . ibuprofen (ADVIL,MOTRIN) 600 MG tablet   Oral   Take 1 tablet (600 mg total) by mouth every 6 (six) hours as needed.   30 tablet   0  BP 154/94  Pulse 84  Temp(Src) 98.1 F (36.7 C) (Oral)  Resp 20  Ht 5\' 11"  (1.803 m)  Wt 200 lb (90.719 kg)  BMI 27.91 kg/m2  SpO2 100% Physical Exam  Nursing note and vitals reviewed. Constitutional: He is oriented to person, place, and time. He appears well-developed and well-nourished.  HENT:  Head: Normocephalic and atraumatic.  Eyes: EOM are normal.  Neck: Normal range of motion. Neck supple.  Cardiovascular: Normal rate, regular rhythm and normal heart sounds.   Pulmonary/Chest: Effort normal and breath sounds normal. No respiratory distress. He has no wheezes. He has no rales. He exhibits tenderness ( right anterior chest wall).  Musculoskeletal: Normal range of motion. He exhibits tenderness. He exhibits no edema.  FROM right shoulder. 5/5 grip strength. 5/5 strength shoulder abduction, flexion, and  extension. Tenderness in right upper back over scapula.  Neurological: He is alert and oriented to person, place, and time.  Skin: Skin is warm and dry.  Psychiatric: He has a normal mood and affect. His behavior is normal.    ED Course  Procedures (including critical care time) Labs Review Labs Reviewed - No data to display Imaging Review No results found.   EKG Interpretation None      MDM   Final diagnoses:  Right shoulder pain    pt with hx of right shoulder rotator cuff pain c/o worsening pain after feeling "pop" while rolling over in bed last night. Denies any other trauma or injuries.   Do not believe imaging needed at this time. Not concerned for emergent process taking place. Will tx symptomatically as needed for pain. Rx: norco, flexeril, and ibuprofen. Advised to f/u with PCP as needed for continued pain. Return precautions provided. Pt verbalized understanding and agreement with tx plan.      Noland Fordyce, PA-C 12/26/13 1701

## 2013-12-29 NOTE — ED Provider Notes (Signed)
Medical screening examination/treatment/procedure(s) were performed by non-physician practitioner and as supervising physician I was immediately available for consultation/collaboration.   EKG Interpretation None       Ramey Ketcherside, MD 12/29/13 1243 

## 2014-03-14 ENCOUNTER — Emergency Department (HOSPITAL_COMMUNITY)
Admission: EM | Admit: 2014-03-14 | Discharge: 2014-03-14 | Disposition: A | Discharge status: Home / Self Care | Payer: Managed Care, Other (non HMO) | Attending: Emergency Medicine | Admitting: Emergency Medicine

## 2014-03-14 ENCOUNTER — Encounter (HOSPITAL_COMMUNITY): Payer: Self-pay | Admitting: Emergency Medicine

## 2014-03-14 DIAGNOSIS — L03211 Cellulitis of face: Principal | ICD-10-CM

## 2014-03-14 DIAGNOSIS — Z862 Personal history of diseases of the blood and blood-forming organs and certain disorders involving the immune mechanism: Secondary | ICD-10-CM | POA: Insufficient documentation

## 2014-03-14 DIAGNOSIS — Z792 Long term (current) use of antibiotics: Secondary | ICD-10-CM | POA: Insufficient documentation

## 2014-03-14 DIAGNOSIS — M129 Arthropathy, unspecified: Secondary | ICD-10-CM | POA: Insufficient documentation

## 2014-03-14 DIAGNOSIS — L0201 Cutaneous abscess of face: Secondary | ICD-10-CM | POA: Insufficient documentation

## 2014-03-14 DIAGNOSIS — L0291 Cutaneous abscess, unspecified: Secondary | ICD-10-CM

## 2014-03-14 DIAGNOSIS — Z8719 Personal history of other diseases of the digestive system: Secondary | ICD-10-CM | POA: Insufficient documentation

## 2014-03-14 DIAGNOSIS — Z8639 Personal history of other endocrine, nutritional and metabolic disease: Secondary | ICD-10-CM | POA: Insufficient documentation

## 2014-03-14 DIAGNOSIS — F17200 Nicotine dependence, unspecified, uncomplicated: Secondary | ICD-10-CM | POA: Insufficient documentation

## 2014-03-14 DIAGNOSIS — I10 Essential (primary) hypertension: Secondary | ICD-10-CM | POA: Insufficient documentation

## 2014-03-14 DIAGNOSIS — L0390 Cellulitis, unspecified: Secondary | ICD-10-CM

## 2014-03-14 MED ORDER — LIDOCAINE HCL (PF) 2 % IJ SOLN
2.0000 mL | Freq: Once | INTRAMUSCULAR | Status: DC
  Filled 2014-03-14: qty 10

## 2014-03-14 MED ORDER — SULFAMETHOXAZOLE-TRIMETHOPRIM 800-160 MG PO TABS: 2.0000 | ORAL_TABLET | Freq: Two times a day (BID) | ORAL | Status: DC

## 2014-03-14 MED ORDER — HYDROCODONE-ACETAMINOPHEN 5-325 MG PO TABS: 1.0000 | ORAL_TABLET | ORAL | Status: DC | PRN

## 2014-03-14 NOTE — ED Notes (Signed)
Redness, swelling to rt side of face,

## 2014-03-14 NOTE — Discharge Instructions (Signed)
Abscess An abscess (boil or furuncle) is an infected area on or under the skin. This area is filled with yellowish-white fluid (pus) and other material (debris). HOME CARE   Only take medicines as told by your doctor.  If you were given antibiotic medicine, take it as directed. Finish the medicine even if you start to feel better.  If gauze is used, follow your doctor's directions for changing the gauze.  To avoid spreading the infection:  Keep your abscess covered with a bandage.  Wash your hands well.  Do not share personal care items, towels, or whirlpools with others.  Avoid skin contact with others.  Keep your skin and clothes clean around the abscess.  Keep all doctor visits as told. GET HELP RIGHT AWAY IF:   You have more pain, puffiness (swelling), or redness in the wound site.  You have more fluid or blood coming from the wound site.  You have muscle aches, chills, or you feel sick.  You have a fever. MAKE SURE YOU:   Understand these instructions.  Will watch your condition.  Will get help right away if you are not doing well or get worse. Document Released: 03/11/2008 Document Revised: 03/24/2012 Document Reviewed: 12/06/2011 [facility] Patient Information 2014 [place].  Continue with frequent warm compress treatments for the next 2 day, ten minutes every 2 hours while awake.  Take the entire course of the antibiotic prescribed.  Sleeping with your head elevated may help also to reduce swelling.  Return here or get rechecked by your doctor if you have any worsened or spreading symptoms.  You may take the hydrocodone prescribed for pain relief.  This will make you drowsy - do not drive within 4 hours of taking this medication.

## 2014-03-14 NOTE — ED Provider Notes (Signed)
CSN: [id number]     Arrival date & time 03/14/14  1548 History      Chief Complaint  Patient presents with  . Abscess    The history is provided by the patient. No language interpreter was used.   HPI Comments: Mark Phillips is a 43 y.o. male who presents to the Emergency Department with complaints of pain and swelling of his right cheek for the past 2 weeks.  He describes a stubborn "zit" mid right cheek which was very hard, he squeezed the site but was unable to express any infection.  Yesterday the swelling became larger and now has surrounding redness and swelling and increased pain.  He has used warm soaks and ibuprofen without improvement in pain.  He denies fevers or chills, nausea, mouth or dental problems.  Past Medical History  Diagnosis Date  . Hypertension   . High cholesterol   . Anginal pain     2010  . GERD (gastroesophageal reflux disease)     hx of  . Arthritis   . PONV (postoperative nausea and vomiting)   . Sleep apnea    Past Surgical History  Procedure Laterality Date  . Colon surgery    . Ankle reconstruction      lt reconstruction and rt scope sx  . Appendectomy  2000  . Wisdom tooth extraction  2011  . Shoulder arthroscopy with subacromial decompression and open rotator c Right 02/25/2013    Procedure: RIGHT SHOULDER ARTHROSCOPY WITH SUBACROMIAL DECOMPRESSION AND MINI OPEN ROTATOR CUFF REPAIR;  Surgeon: Johnn Hai, MD;  Location: [facility];  Service: Orthopedics;  Laterality: Right;   Family History  Problem Relation Age of Onset  . Heart disease    . Arthritis    . Lung disease    . Cancer    . Asthma    . Diabetes     History  Substance Use Topics  . Smoking status: Current Every Day Smoker -- 1.00 packs/day for 19 years    Types: Cigarettes  . Smokeless tobacco: Former Systems developer    Types: [place] date: 10/07/1993  . Alcohol Use: No    Review of Systems  Constitutional: Negative for fever and chills.  HENT: Positive for facial  swelling.   Eyes: Negative for pain and visual disturbance.  Gastrointestinal: Negative for nausea and vomiting.  Skin: Positive for color change and wound.  Neurological: Negative for headaches.  All other systems reviewed and are negative.     Allergies  Albuterol  Home Medications   Prior to Admission medications   Medication Sig Start Date End Date Taking? Authorizing Provider  ibuprofen (ADVIL,MOTRIN) 200 MG tablet Take 600 mg by mouth every 6 (six) hours as needed.   Yes Historical Provider, MD  HYDROcodone-acetaminophen (NORCO/VICODIN) 5-325 MG per tablet Take 1 tablet by mouth every 4 (four) hours as needed for moderate pain. 03/14/14   Evalee Jefferson, PA-C  sulfamethoxazole-trimethoprim (BACTRIM DS) 800-160 MG per tablet Take 2 tablets by mouth 2 (two) times daily. 03/14/14   Evalee Jefferson, PA-C   Triage Vitals: BP 147/89  Pulse 96  Temp(Src) 98.2 F (36.8 C) (Oral)  Resp 22  Ht 5\' 11"  (1.803 m)  Wt 205 lb (92.987 kg)  BMI 28.60 kg/m2  SpO2 100%  Physical Exam  Nursing note and vitals reviewed. Constitutional: He is oriented to person, place, and time. He appears well-developed and well-nourished. No distress.  HENT:  Head: Atraumatic. Head is without right periorbital  erythema.  Right Ear: Tympanic membrane normal.  Left Ear: Tympanic membrane normal.  Mouth/Throat: Oropharynx is clear and moist. No trismus in the jaw. No dental abscesses or dental caries.   Indurated papule mid right cheek approx 1 cm x 1.5 cm with central dark punctum which is not spontaneously draining, no fluctuance.  2 cm surrounding erythema to right zygoma.   Eyes: Conjunctivae and EOM are normal.  Neck: Normal range of motion. No tracheal deviation present.  Cardiovascular: Normal rate.   Pulmonary/Chest: Effort normal. No respiratory distress.  Musculoskeletal: Normal range of motion.  Lymphadenopathy:       Head (right side): No submandibular, no tonsillar and no preauricular adenopathy present.     He has no cervical adenopathy.  Neurological: He is alert and oriented to person, place, and time. No sensory deficit.  Skin: Skin is warm and dry.  Psychiatric: He has a normal mood and affect. His behavior is normal.    ED Course  Procedures (including critical care time)   INCISION AND DRAINAGE Performed by: Evalee Jefferson Consent: Verbal consent obtained. Risks and benefits: risks, benefits and alternatives were discussed Type: abscess  Body area: right cheek  Anesthesia: local infiltration  Incision was made with a scalpel.  #11 blade used,  Opened abscess the width of the #11 blade.  Local anesthetic: lidocaine 2% without epinephrine  Anesthetic total: 1 ml  Complexity: complex   Drainage: purulent  Drainage amount: small  Packing material: no packing  Patient tolerance: Patient tolerated the procedure well with no immediate complications.  Pror to procedure,  Bedside US performed and found a small pus pocket beneath the central punctum.   DIAGNOSTIC STUDIES: Oxygen Saturation is 100% on room air, normal by my interpretation.    COORDINATION OF CARE:  11:26 PM- Discussed treatment plan with patient, and the patient agreed to the plan.   Labs Review Labs Reviewed - No data to display  Imaging Review    EKG Interpretation None      MDM   Final diagnoses:  Cellulitis and abscess    Bactrim,  Hydrocodone, warm compresses.  Pt advised recheck here or by pcp for any worsened sx..  The patient appears reasonably screened and/or stabilized for discharge and I doubt any other medical condition or other [facility] requiring further screening, evaluation, or treatment in the ED at this time prior to discharge.   Evalee Jefferson, PA-C 03/15/14 2341

## 2014-03-15 ENCOUNTER — Emergency Department (HOSPITAL_COMMUNITY): Payer: Managed Care, Other (non HMO)

## 2014-03-15 ENCOUNTER — Inpatient Hospital Stay (HOSPITAL_COMMUNITY)
Admission: EM | Admit: 2014-03-15 | Discharge: 2014-03-19 | DRG: 603 | Disposition: A | Discharge status: Home / Self Care | Payer: Managed Care, Other (non HMO) | Attending: Family Medicine | Admitting: Family Medicine

## 2014-03-15 ENCOUNTER — Encounter (HOSPITAL_COMMUNITY): Payer: Self-pay | Admitting: Emergency Medicine

## 2014-03-15 DIAGNOSIS — M129 Arthropathy, unspecified: Secondary | ICD-10-CM | POA: Diagnosis present

## 2014-03-15 DIAGNOSIS — L0201 Cutaneous abscess of face: Principal | ICD-10-CM | POA: Diagnosis present

## 2014-03-15 DIAGNOSIS — G4730 Sleep apnea, unspecified: Secondary | ICD-10-CM | POA: Diagnosis present

## 2014-03-15 DIAGNOSIS — K219 Gastro-esophageal reflux disease without esophagitis: Secondary | ICD-10-CM | POA: Diagnosis present

## 2014-03-15 DIAGNOSIS — F17200 Nicotine dependence, unspecified, uncomplicated: Secondary | ICD-10-CM | POA: Diagnosis present

## 2014-03-15 DIAGNOSIS — L03211 Cellulitis of face: Secondary | ICD-10-CM | POA: Diagnosis present

## 2014-03-15 DIAGNOSIS — Z825 Family history of asthma and other chronic lower respiratory diseases: Secondary | ICD-10-CM

## 2014-03-15 DIAGNOSIS — I10 Essential (primary) hypertension: Secondary | ICD-10-CM | POA: Diagnosis present

## 2014-03-15 DIAGNOSIS — E7800 Pure hypercholesterolemia, unspecified: Secondary | ICD-10-CM | POA: Diagnosis present

## 2014-03-15 DIAGNOSIS — Z833 Family history of diabetes mellitus: Secondary | ICD-10-CM

## 2014-03-15 LAB — CBC WITH DIFFERENTIAL/PLATELET
BASOS PCT: 0 % (ref 0–1)
Basophils Absolute: 0.1 10*3/uL (ref 0.0–0.1)
EOS ABS: 0.6 10*3/uL (ref 0.0–0.7)
EOS PCT: 5 % (ref 0–5)
HEMATOCRIT: 42 % (ref 39.0–52.0)
HEMOGLOBIN: 14.6 g/dL (ref 13.0–17.0)
Lymphocytes Relative: 45 % (ref 12–46)
Lymphs Abs: 5 10*3/uL — ABNORMAL HIGH (ref 0.7–4.0)
MCH: 30.7 pg (ref 26.0–34.0)
MCHC: 34.8 g/dL (ref 30.0–36.0)
MCV: 88.4 fL (ref 78.0–100.0)
MONO ABS: 0.9 10*3/uL (ref 0.1–1.0)
MONOS PCT: 8 % (ref 3–12)
Neutro Abs: 4.6 10*3/uL (ref 1.7–7.7)
Neutrophils Relative %: 42 % — ABNORMAL LOW (ref 43–77)
Platelets: 148 10*3/uL — ABNORMAL LOW (ref 150–400)
RBC: 4.75 MIL/uL (ref 4.22–5.81)
RDW: 13.2 % (ref 11.5–15.5)
WBC: 11.2 10*3/uL — ABNORMAL HIGH (ref 4.0–10.5)

## 2014-03-15 LAB — BASIC METABOLIC PANEL
BUN: 10 mg/dL (ref 6–23)
CHLORIDE: 102 meq/L (ref 96–112)
CO2: 23 mEq/L (ref 19–32)
CREATININE: 0.81 mg/dL (ref 0.50–1.35)
Calcium: 8.3 mg/dL — ABNORMAL LOW (ref 8.4–10.5)
GFR calc Af Amer: >90 mL/min (ref >90)
GFR calc non Af Amer: >90 mL/min (ref >90)
GLUCOSE: 125 mg/dL — AB (ref 70–99)
POTASSIUM: 3.8 meq/L (ref 3.7–5.3)
Sodium: 137 mEq/L (ref 137–147)

## 2014-03-15 MED ORDER — IOHEXOL 300 MG/ML  SOLN
75.0000 mL | Freq: Once | INTRAMUSCULAR | Status: AC | PRN
  Administered 2014-03-15: 75 mL via INTRAVENOUS

## 2014-03-15 MED ORDER — SODIUM CHLORIDE 0.9 % IV BOLUS (SEPSIS)
500.0000 mL | Freq: Once | INTRAVENOUS | Status: AC
  Administered 2014-03-15: 500 mL via INTRAVENOUS

## 2014-03-15 MED ORDER — VANCOMYCIN HCL 10 G IV SOLR
1500.0000 mg | Freq: Once | INTRAVENOUS | Status: AC
  Administered 2014-03-15: 1500 mg via INTRAVENOUS
  Filled 2014-03-15: qty 1500

## 2014-03-15 NOTE — ED Provider Notes (Signed)
CSN: [id number]     Arrival date & time 03/15/14  1748 History  This chart was scribed for Mark Christen, MD by Rolanda Lundborg, ED Scribe. This patient was seen in room APA12/APA12 and the patient's care was started at 7:42 PM.    Chief Complaint  Patient presents with  . Cellulitis   The history is provided by the patient. No language interpreter was used.   HPI Comments: Mark Phillips is a 43 y.o. male who presents to the Emergency Department complaining of right-sided facial swelling that started as a small "zit" onset 2 weeks ago and worsened in the last 2 days. He states he was seen here yesterday and had an I&D done but the swelling has worsened since. He took a dose of Bactrim last night and this morning. He states he used a warm compress last night but it made the swelling much worse so he has been icing it instead. 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   Past Medical History  Diagnosis Date  . Hypertension   . High cholesterol   . Anginal pain     2010  . GERD (gastroesophageal reflux disease)     hx of  . Arthritis   . PONV (postoperative nausea and vomiting)   . Sleep apnea    Past Surgical History  Procedure Laterality Date  . Colon surgery    . Ankle reconstruction      lt reconstruction and rt scope sx  . Appendectomy  2000  . Wisdom tooth extraction  2011  . Shoulder arthroscopy with subacromial decompression and open rotator c Right 02/25/2013    Procedure: RIGHT SHOULDER ARTHROSCOPY WITH SUBACROMIAL DECOMPRESSION AND MINI OPEN ROTATOR CUFF REPAIR;  Surgeon: Johnn Hai, MD;  Location: [facility];  Service: Orthopedics;  Laterality: Right;   Family History  Problem Relation Age of Onset  . Heart disease    . Arthritis    . Lung disease    . Cancer    . Asthma    . Diabetes     History  Substance Use Topics  . Smoking status: Current Every Day Smoker -- 1.00 packs/day for 19 years    Types: Cigarettes  . Smokeless tobacco: Former Systems developer    Types: [place] date:  10/07/1993  . Alcohol Use: No    Review of Systems  Constitutional: Negative for fever.  HENT: Positive for facial swelling.   Gastrointestinal: Negative for vomiting.   A complete 10 system review of systems was obtained and all systems are negative except as noted in the HPI and PMH.     Allergies  Albuterol  Home Medications   Prior to Admission medications   Medication Sig Start Date End Date Taking? Authorizing Provider  HYDROcodone-acetaminophen (NORCO/VICODIN) 5-325 MG per tablet Take 1 tablet by mouth every 4 (four) hours as needed for moderate pain. 03/14/14  Yes Almyra Free Idol, PA-C  ibuprofen (ADVIL,MOTRIN) 200 MG tablet Take 600 mg by mouth every 6 (six) hours as needed.   Yes Historical Provider, MD  sulfamethoxazole-trimethoprim (BACTRIM DS) 800-160 MG per tablet Take 2 tablets by mouth 2 (two) times daily. 03/14/14  Yes Almyra Free Idol, PA-C   BP 134/83  Pulse 63  Temp(Src) 98.9 F (37.2 C) (Oral)  Resp 20  Ht 5\' 11"  (1.803 m)  Wt 205 lb (92.987 kg)  BMI 28.60 kg/m2  SpO2 97% Physical Exam  Nursing note and vitals reviewed. Constitutional: He is oriented to person, place, and time.  He appears well-developed and well-nourished.  HENT:  Head: Normocephalic and atraumatic.  Face -  Just inferior to the right eye there is a triangular area of erythema about 4cm wide and 5 cm long. indurated and erythematous. No trismus.   Eyes: Conjunctivae and EOM are normal. Pupils are equal, round, and reactive to light.  Neck: Normal range of motion. Neck supple.  Cardiovascular: Normal rate, regular rhythm and normal heart sounds.   Pulmonary/Chest: Effort normal and breath sounds normal.  Abdominal: Soft. Bowel sounds are normal.  Musculoskeletal: Normal range of motion.  Neurological: He is alert and oriented to person, place, and time.  Skin: Skin is warm and dry.  Psychiatric: He has a normal mood and affect. His behavior is normal.    ED Course  Procedures (including critical  care time) Medications  vancomycin (VANCOCIN) 1,500 mg in sodium chloride 0.9 % 500 mL IVPB (1,500 mg Intravenous New Bag/Given 03/15/14 2105)  sodium chloride 0.9 % bolus 500 mL (0 mLs Intravenous Stopped 03/15/14 2105)  iohexol (OMNIPAQUE) 300 MG/ML solution 75 mL (75 mLs Intravenous Contrast Given 03/15/14 2052)    DIAGNOSTIC STUDIES: Oxygen Saturation is 97% on RA, normal by my interpretation.    COORDINATION OF CARE: 7:55 PM- Discussed treatment plan with pt which includes IV vancomycin. Discussed likelihood of admission. Pt agrees to plan.    Labs Review Labs Reviewed  BASIC METABOLIC PANEL - Abnormal; Notable for the following:    Glucose, Bld 125 (*)    Calcium 8.3 (*)    All other components within normal limits  CBC WITH DIFFERENTIAL - Abnormal; Notable for the following:    WBC 11.2 (*)    Platelets 148 (*)    Neutrophils Relative % 42 (*)    Lymphs Abs 5.0 (*)    All other components within normal limits    Imaging Review Ct Maxillofacial W/cm  03/15/2014   CLINICAL DATA:  Right cheek cellulitis.  Facial pain.  EXAM: CT MAXILLOFACIAL WITH CONTRAST  TECHNIQUE: Multidetector CT imaging of the maxillofacial structures was performed with intravenous contrast. Multiplanar CT image reconstructions were also generated. A small metallic BB was placed on the right temple in order to reliably differentiate right from left.  CONTRAST:  18mL OMNIPAQUE IOHEXOL 300 MG/ML  SOLN  COMPARISON:  None.  FINDINGS: Moderate cellulitic change over the right malar region. Skin thickening with inflammatory change of the underlying fat. No visible facial vein thrombus. Unremarkable cavernous sinuses and internal jugular vein. No deep malar abscess. No sinus disease. Slight dental caries of right maxillary molar but the cellulitic process does not appear odontogenic. No buccal, parapharyngeal, sublingual, or submandibular space process. Mild right preseptal periorbital soft tissue swelling. Negative orbits.  No osteomyelitis. No sinus air-fluid level. Negative middle ear and mastoids. Slight reactive level 1 and level 2 lymph nodes, without deep space abscess. Congenital C2-C3 fusion. Negative intracranial compartment.  IMPRESSION: Facial cellulitis on the right without deep abscess. No evidence for paranasal sinus or odontogenic source.   Electronically Signed   By: Rolla Flatten M.D.   On: 03/15/2014 21:21     EKG Interpretation None      MDM   Final diagnoses:  Facial cellulitis    Worsening facial cellulitis despite I and D yesterday and initiation of Bactrim. Patient is hemodynamically stable. CT maxillofacial shows no deep abscess. IV vancomycin. Admit to general medicine I personally performed the services described in this documentation, which was scribed in my presence. The recorded information  has been reviewed and is accurate.     Mark Christen, MD 03/15/14 2206

## 2014-03-15 NOTE — ED Notes (Signed)
Swelling , redness rt side of face, seen here yesterday.  Taking bactrim

## 2014-03-16 DIAGNOSIS — L0201 Cutaneous abscess of face: Principal | ICD-10-CM

## 2014-03-16 DIAGNOSIS — L03211 Cellulitis of face: Principal | ICD-10-CM

## 2014-03-16 LAB — COMPREHENSIVE METABOLIC PANEL
ALT: 17 U/L (ref 0–53)
AST: 11 U/L (ref 0–37)
Albumin: 3.1 g/dL — ABNORMAL LOW (ref 3.5–5.2)
Alkaline Phosphatase: 52 U/L (ref 39–117)
BUN: 11 mg/dL (ref 6–23)
CO2: 24 mEq/L (ref 19–32)
Calcium: 8.6 mg/dL (ref 8.4–10.5)
Chloride: 102 mEq/L (ref 96–112)
Creatinine, Ser: 0.79 mg/dL (ref 0.50–1.35)
GFR calc Af Amer: >90 mL/min (ref >90)
Glucose, Bld: 159 mg/dL — ABNORMAL HIGH (ref 70–99)
Potassium: 4.1 mEq/L (ref 3.7–5.3)
Sodium: 138 mEq/L (ref 137–147)
Total Protein: 6 g/dL (ref 6.0–8.3)

## 2014-03-16 LAB — CBC
HEMATOCRIT: 41.8 % (ref 39.0–52.0)
Hemoglobin: 14.4 g/dL (ref 13.0–17.0)
MCH: 30.5 pg (ref 26.0–34.0)
MCHC: 34.4 g/dL (ref 30.0–36.0)
MCV: 88.6 fL (ref 78.0–100.0)
Platelets: 148 10*3/uL — ABNORMAL LOW (ref 150–400)
RBC: 4.72 MIL/uL (ref 4.22–5.81)
RDW: 13.2 % (ref 11.5–15.5)
WBC: 9.4 10*3/uL (ref 4.0–10.5)

## 2014-03-16 MED ORDER — HYDROMORPHONE HCL PF 1 MG/ML IJ SOLN: 1.0000 mg | INTRAMUSCULAR | Status: DC | PRN

## 2014-03-16 MED ORDER — PROMETHAZINE HCL 25 MG/ML IJ SOLN: 12.5000 mg | Freq: Four times a day (QID) | INTRAMUSCULAR | Status: DC | PRN

## 2014-03-16 MED ORDER — ENOXAPARIN SODIUM 40 MG/0.4ML ~~LOC~~ SOLN
40.0000 mg | SUBCUTANEOUS | Status: DC
  Filled 2014-03-16 (×3): qty 0.4

## 2014-03-16 MED ORDER — OXYCODONE HCL 5 MG PO TABS: 5.0000 mg | ORAL_TABLET | ORAL | Status: DC | PRN

## 2014-03-16 MED ORDER — SODIUM CHLORIDE 0.9 % IV SOLN
1250.0000 mg | Freq: Two times a day (BID) | INTRAVENOUS | Status: DC
  Administered 2014-03-16 – 2014-03-18 (×6): 1250 mg via INTRAVENOUS
  Filled 2014-03-16 (×11): qty 1250

## 2014-03-16 MED ORDER — SODIUM CHLORIDE 0.9 % IV SOLN
INTRAVENOUS | Status: DC
  Administered 2014-03-16 – 2014-03-19 (×5): via INTRAVENOUS

## 2014-03-16 NOTE — Care Management Utilization Note (Signed)
UR completed 

## 2014-03-16 NOTE — H&P (Signed)
PCP:   Monico Blitz, MD   Chief Complaint:  Facial swelling  HPI:  43 year old male who  has a past medical history of Hypertension; High cholesterol; Anginal pain; GERD (gastroesophageal reflux disease); Arthritis; PONV (postoperative nausea and vomiting); and Sleep apnea. Came to the ED with worsening right-sided facial swelling. Patient was seen in the ED yesterday and underwent incision and drainage of right facial abscess. Patient was sent home on Bactrim, warm compresses. Today he came back to the ED with worsening swelling of the right side of the face. And now swelling extending up to the right eye. He says that the pain is severe, denies any fever or chills no nausea vomiting or diarrhea. No chest pain shortness of breath. In the ED maxillofacial CT scan was done, which did not show any facial abscess.  Allergies:   Allergies  Allergen Reactions  . Albuterol Swelling      Past Medical History  Diagnosis Date  . Hypertension   . High cholesterol   . Anginal pain     2010  . GERD (gastroesophageal reflux disease)     hx of  . Arthritis   . PONV (postoperative nausea and vomiting)   . Sleep apnea     Past Surgical History  Procedure Laterality Date  . Colon surgery    . Ankle reconstruction      lt reconstruction and rt scope sx  . Appendectomy  2000  . Wisdom tooth extraction  2011  . Shoulder arthroscopy with subacromial decompression and open rotator c Right 02/25/2013    Procedure: RIGHT SHOULDER ARTHROSCOPY WITH SUBACROMIAL DECOMPRESSION AND MINI OPEN ROTATOR CUFF REPAIR;  Surgeon: Johnn Hai, MD;  Location: [facility];  Service: Orthopedics;  Laterality: Right;    Prior to Admission medications   Medication Sig Start Date End Date Taking? Authorizing Provider  HYDROcodone-acetaminophen (NORCO/VICODIN) 5-325 MG per tablet Take 1 tablet by mouth every 4 (four) hours as needed for moderate pain. 03/14/14  Yes Almyra Free Idol, PA-C  ibuprofen (ADVIL,MOTRIN) 200 MG  tablet Take 600 mg by mouth every 6 (six) hours as needed.   Yes Historical Provider, MD  sulfamethoxazole-trimethoprim (BACTRIM DS) 800-160 MG per tablet Take 2 tablets by mouth 2 (two) times daily. 03/14/14  Yes Evalee Jefferson, PA-C    Social History:  reports that he has been smoking Cigarettes.  He has a 19 pack-year smoking history. He quit smokeless tobacco use about 20 years ago. His smokeless tobacco use included Chew. He reports that he does not drink alcohol or use illicit drugs.  Family History  Problem Relation Age of Onset  . Heart disease    . Arthritis    . Lung disease    . Cancer    . Asthma    . Diabetes       All the positives are listed in BOLD  Review of Systems:  HEENT: Headache, blurred vision, runny nose, sore throat Neck: Hypothyroidism, hyperthyroidism,,lymphadenopathy Chest : Shortness of breath, history of COPD, Asthma Heart : Chest pain, history of coronary arterey disease GI:  Nausea, vomiting, diarrhea, constipation, GERD GU: Dysuria, urgency, frequency of urination, hematuria Neuro: Stroke, seizures, syncope Psych: Depression, anxiety, hallucinations   Physical Exam: Blood pressure 126/68, pulse 77, temperature 97.9 F (36.6 C), temperature source Oral, resp. rate 16, height 5\' 11"  (1.803 m), weight 95.301 kg (210 lb 1.6 oz), SpO2 99.00%. Constitutional:   Patient is a well-developed and well-nourished *male in no acute distress and cooperative with exam. Head:  Normocephalic and at a raumatic Mouth: Mucus membranes moist Face- mild erythema and edema noted on the right cheek extending up to the right lower eyelid, positive warm to touch, tender to palpation Eyes: PERRL, EOMI, conjunctivae normal Neck: Supple, No Thyromegaly Cardiovascular: RRR, S1 normal, S2 normal Pulmonary/Chest: CTAB, no wheezes, rales, or rhonchi Abdominal: Soft. Non-tender, non-distended, bowel sounds are normal, no masses, organomegaly, or guarding present.  Neurological: A&O  x3, Strenght is normal and symmetric bilaterally, cranial nerve II-XII are grossly intact, no focal motor deficit, sensory intact to light touch bilaterally.  Extremities : No Cyanosis, Clubbing or Edema  Labs on Admission:  Basic Metabolic Panel:  Recent Labs Lab 03/15/14 2040  NA 137  K 3.8  CL 102  CO2 23  GLUCOSE 125*  BUN 10  CREATININE 0.81  CALCIUM 8.3*   Liver Function Tests: No results found for this basename: AST, ALT, ALKPHOS, BILITOT, PROT, ALBUMIN,  in the last 168 hours No results found for this basename: LIPASE, AMYLASE,  in the last 168 hours No results found for this basename: AMMONIA,  in the last 168 hours CBC:  Recent Labs Lab 03/15/14 2040  WBC 11.2*  NEUTROABS 4.6  HGB 14.6  HCT 42.0  MCV 88.4  PLT 148*   Cardiac Enzymes: No results found for this basename: CKTOTAL, CKMB, CKMBINDEX, TROPONINI,  in the last 168 hours  BNP (last 3 results) No results found for this basename: PROBNP,  in the last 8760 hours CBG: No results found for this basename: GLUCAP,  in the last 168 hours  Radiological Exams on Admission: Ct Maxillofacial W/cm  03/15/2014   CLINICAL DATA:  Right cheek cellulitis.  Facial pain.  EXAM: CT MAXILLOFACIAL WITH CONTRAST  TECHNIQUE: Multidetector CT imaging of the maxillofacial structures was performed with intravenous contrast. Multiplanar CT image reconstructions were also generated. A small metallic BB was placed on the right temple in order to reliably differentiate right from left.  CONTRAST:  61mL OMNIPAQUE IOHEXOL 300 MG/ML  SOLN  COMPARISON:  None.  FINDINGS: Moderate cellulitic change over the right malar region. Skin thickening with inflammatory change of the underlying fat. No visible facial vein thrombus. Unremarkable cavernous sinuses and internal jugular vein. No deep malar abscess. No sinus disease. Slight dental caries of right maxillary molar but the cellulitic process does not appear odontogenic. No buccal,  parapharyngeal, sublingual, or submandibular space process. Mild right preseptal periorbital soft tissue swelling. Negative orbits. No osteomyelitis. No sinus air-fluid level. Negative middle ear and mastoids. Slight reactive level 1 and level 2 lymph nodes, without deep space abscess. Congenital C2-C3 fusion. Negative intracranial compartment.  IMPRESSION: Facial cellulitis on the right without deep abscess. No evidence for paranasal sinus or odontogenic source.   Electronically Signed   By: Rolla Flatten M.D.   On: 03/15/2014 21:21     Assessment/Plan Active Problems:   Facial cellulitis  Facial cellulitis Will admit the patient and start IV vancomycin. Once patient starts to improve he can be discharged on Bactrim.   Code status: Presumed to go to  Family discussion: No family at bedside.  Time Spent on Admission: 60 minutes  [facility] Pager: [phone] 03/16/2014, 2:55 AM  If 7PM-7AM, please contact night-coverage  [email or web address]  Password [email or web address]

## 2014-03-16 NOTE — Progress Notes (Signed)
Patient has CPAP unit on bedside table, says he will place on himself, let him know i'm here tonight if he needs anything to get RN to call me. Will continue to monitor

## 2014-03-16 NOTE — Progress Notes (Signed)
2:20 PM I agree with HPI/GPe and A/P per Dr. Darrick Meigs  43 year old occasionally on admitted with right-sided facial swelling after having this incised and drained. Patient was discharged home on Bactrim and came back with for swelling on the right side of face with extension up to the right eye without pain. Maxillofacial CT was done. Patient subsequently placed on IV vancomycin for broad-spectrum coverage       Patient Active Problem List   Diagnosis Date Noted  . Facial cellulitis 03/15/2014  . Pain in joint, shoulder region 04/08/2013  . Muscle weakness (generalized) 04/08/2013  . Right rotator cuff tear 02/25/2013  . Gout 08/27/2012  . Foot infection 08/27/2012  . Achilles bursitis or tendinitis 08/27/2012  . Cervicalgia 06/24/2011  . Cervical disc disorder with radiculopathy 06/12/2011   Continue antibiotics CBC plus differential in a.m. Monitor for resolution and likely transition to oral antibiotics once again once resolution  Verneita Griffes, MD [facility] [phone]

## 2014-03-16 NOTE — Progress Notes (Signed)
ANTIBIOTIC CONSULT NOTE - INITIAL  Pharmacy Consult for vancomycin Indication: facial cellulitis  Allergies  Allergen Reactions  . Albuterol Swelling    Patient Measurements: Height: 5\' 11"  (180.3 cm) Weight: 210 lb 1.6 oz (95.301 kg) IBW/kg (Calculated) : 75.3 Adjusted Body Weight: 82 kg  Vital Signs: Temp: 97.9 F (36.6 C) (06/09 2330) Temp src: Oral (06/09 2330) BP: 126/68 mmHg (06/09 2330) Pulse Rate: 77 (06/10 0015) Intake/Output from previous day:   Intake/Output from this shift:    Labs:  Recent Labs  03/15/14 2040  WBC 11.2*  HGB 14.6  PLT 148*  CREATININE 0.81   Estimated Creatinine Clearance: 140 ml/min (by C-G formula based on Cr of 0.81). No results found for this basename: VANCOTROUGH, VANCOPEAK, VANCORANDOM, GENTTROUGH, GENTPEAK, GENTRANDOM, TOBRATROUGH, TOBRAPEAK, TOBRARND, AMIKACINPEAK, AMIKACINTROU, AMIKACIN,  in the last 72 hours   Microbiology: No results found for this or any previous visit (from the past 720 hour(s)).  Medical History: Past Medical History  Diagnosis Date  . Hypertension   . High cholesterol   . Anginal pain     2010  . GERD (gastroesophageal reflux disease)     hx of  . Arthritis   . PONV (postoperative nausea and vomiting)   . Sleep apnea     Medications:  Scheduled:  . enoxaparin (LOVENOX) injection  40 mg Subcutaneous Q24H  . vancomycin  1,250 mg Intravenous Q12H   Infusions:  . sodium chloride     PRN:   Assessment: 69yr male, normal renal function, failure to improve after I&D of facial abcess (?) tx'd for 1 day with Septra.  Patient given Vancomycin 1500mg  IV x 1 dose at 8pm last evening in ED.  Goal of Therapy:  Desire vancomycin trough level 12-38mcg/ml for cellulitis.  Plan:  1.  Start maintenance regimen 1250mg  vancomycin q12h 2.  Monitor for infection indices, and for renal function 3.  Check steady state trough vancomycin level  Lyrik Buresh E 03/16/2014,2:04 AM

## 2014-03-16 NOTE — Progress Notes (Signed)
Patient started on CPAP per his home settings.  Patient uses a full face mask at home and his machine is set on 11cmH2O per patient with no humidity.  RT set our machine up with these settings and patient was comfortable.  RT made patient aware that if he had any questions or needed anything throughout the night to let his RN know and RT would come assist.  RT will continue to monitor.

## 2014-03-17 LAB — CBC WITH DIFFERENTIAL/PLATELET
Basophils Absolute: 0.1 10*3/uL (ref 0.0–0.1)
Basophils Relative: 1 % (ref 0–1)
EOS ABS: 0.7 10*3/uL (ref 0.0–0.7)
Eosinophils Relative: 8 % — ABNORMAL HIGH (ref 0–5)
HCT: 42.9 % (ref 39.0–52.0)
Hemoglobin: 14.8 g/dL (ref 13.0–17.0)
LYMPHS PCT: 41 % (ref 12–46)
Lymphs Abs: 3.9 10*3/uL (ref 0.7–4.0)
MCH: 30.4 pg (ref 26.0–34.0)
MCHC: 34.5 g/dL (ref 30.0–36.0)
MCV: 88.1 fL (ref 78.0–100.0)
Monocytes Absolute: 0.7 10*3/uL (ref 0.1–1.0)
Monocytes Relative: 8 % (ref 3–12)
NEUTROS PCT: 42 % — AB (ref 43–77)
Neutro Abs: 3.9 10*3/uL (ref 1.7–7.7)
PLATELETS: 153 10*3/uL (ref 150–400)
RBC: 4.87 MIL/uL (ref 4.22–5.81)
RDW: 13.1 % (ref 11.5–15.5)
WBC: 9.3 10*3/uL (ref 4.0–10.5)

## 2014-03-17 MED ORDER — CYCLOBENZAPRINE HCL 10 MG PO TABS
5.0000 mg | ORAL_TABLET | Freq: Once | ORAL | Status: AC
  Administered 2014-03-17: 5 mg via ORAL
  Filled 2014-03-17: qty 1

## 2014-03-17 NOTE — Progress Notes (Signed)
Hospital CPAP at beside. Patient puts on and off by himself. Told patient to call if he needed help with anything. Water reservoir refilled.

## 2014-03-17 NOTE — Progress Notes (Signed)
Note: This document was prepared with digital dictation and possible smart phrase technology. Any transcriptional errors that result from this process are unintentional.   Mark Phillips NUU:[id number] DOB: 1971-02-11 DOA: 03/15/2014 PCP: Monico Blitz, MD  Brief narrative:  43 year old occasionally on admitted with right-sided facial swelling after having this incised and drained. Patient was discharged home on Bactrim and came back with for swelling on the right side of face with extension up to the right eye without pain. Maxillofacial CT was done. Patient subsequently placed on IV vancomycin for broad-spectrum coverage   Past medical history-As per Problem list Chart reviewed as below- none  Consultants:  nono  Procedures:  none  Antibiotics:  Vancomycin 6/09   Subjective  Well.  No issues currently.  tol diet NO fever Facial swelling still similar, No pain or tenderness except when touched   Objective   Interim History: nad    Objective: Filed Vitals:   03/16/14 1609 03/16/14 2214 03/17/14 0650 03/17/14 1552  BP: 122/64 112/58 103/61 120/69  Pulse: 58 61 57 68  Temp: 98 F (36.7 C) 97.3 F (36.3 C) 97.3 F (36.3 C) 98.1 F (36.7 C)  TempSrc:  Axillary Axillary Oral  Resp: 18 20 20 14   Height:      Weight:      SpO2: 98% 99% 99% 97%    Intake/Output Summary (Last 24 hours) at 03/17/14 1624 Last data filed at 03/17/14 1200  Gross per 24 hour  Intake 2507.5 ml  Output      0 ml  Net 2507.5 ml    Exam:  General: diffuse facial swelling R face/periorbital area Cardiovascular:  s1 s2 no m/r/g Respiratory: clear  Data Reviewed: Basic Metabolic Panel:  Recent Labs Lab 03/15/14 2040 03/16/14 0559  NA 137 138  K 3.8 4.1  CL 102 102  CO2 23 24  GLUCOSE 125* 159*  BUN 10 11  CREATININE 0.81 0.79  CALCIUM 8.3* 8.6   Liver Function Tests:  Recent Labs Lab 03/16/14 0559  AST 11  ALT 17  ALKPHOS 52  BILITOT <0.2*  PROT 6.0  ALBUMIN  3.1*   No results found for this basename: LIPASE, AMYLASE,  in the last 168 hours No results found for this basename: AMMONIA,  in the last 168 hours CBC:  Recent Labs Lab 03/15/14 2040 03/16/14 0559 03/17/14 0614  WBC 11.2* 9.4 9.3  NEUTROABS 4.6  --  3.9  HGB 14.6 14.4 14.8  HCT 42.0 41.8 42.9  MCV 88.4 88.6 88.1  PLT 148* 148* 153   Cardiac Enzymes: No results found for this basename: CKTOTAL, CKMB, CKMBINDEX, TROPONINI,  in the last 168 hours BNP: No components found with this basename: POCBNP,  CBG: No results found for this basename: GLUCAP,  in the last 168 hours  No results found for this or any previous visit (from the past 240 hour(s)).   Studies:              All Imaging reviewed and is as per above notation   Scheduled Meds: . enoxaparin (LOVENOX) injection  40 mg Subcutaneous Q24H  . vancomycin  1,250 mg Intravenous Q12H   Continuous Infusions: . sodium chloride 75 mL/hr at 03/17/14 1551     Assessment/Plan: 1. Facial cellulitis--s/p drainage in ED 03/14/14-no cultures.  Continue Vancomycin for now.  Cbc wnl.  Expect slow resolution of swelling as lysis from the bacteria will cause redness.  Will try to taper Abx tomorrow am and see respose  Code Status: full Family Communication: discussed with mother bedside Disposition Plan: inpatient, d/c tele   Verneita Griffes, MD  [facility] Pager [phone] 03/17/2014, 4:24 PM    LOS: 2 days

## 2014-03-18 NOTE — ED Provider Notes (Signed)
Medical screening examination/treatment/procedure(s) were performed by non-physician practitioner and as supervising physician I was immediately available for consultation/collaboration.   EKG Interpretation None       Nat Christen, MD 03/18/14 1952

## 2014-03-18 NOTE — Progress Notes (Signed)
Note: This document was prepared with digital dictation and possible smart phrase technology. Any transcriptional errors that result from this process are unintentional.   Mark Phillips TDS:[id number] DOB: December 03, 1970 DOA: 03/15/2014 PCP: Monico Blitz, MD  Brief narrative:  43 year old occasionally on admitted with right-sided facial swelling after having this incised and drained. Patient was discharged home on Bactrim and came back with for swelling on the right side of face with extension up to the right eye without pain. Maxillofacial CT was done. Patient subsequently placed on IV vancomycin for broad-spectrum coverage   Past medical history-As per Problem list Chart reviewed as below- none  Consultants:  nono  Procedures:  none  Antibiotics:  Vancomycin 6/09   Subjective  Well.  No issues currently.  tol diet On face appears to be more pointing and is a little tender no purulence but does have positive slip sign   Objective   Interim History: nad    Objective: Filed Vitals:   03/17/14 1552 03/17/14 2201 03/18/14 0632 03/18/14 1409  BP: 120/69 122/79 99/61 134/82  Pulse: 68 64 53 60  Temp: 98.1 F (36.7 C) 98.2 F (36.8 C) 97.3 F (36.3 C) 98.2 F (36.8 C)  TempSrc: Oral Oral Oral Oral  Resp: 14 20 20 18   Height:      Weight:      SpO2: 97% 96% 99% 98%    Intake/Output Summary (Last 24 hours) at 03/18/14 1454 Last data filed at 03/18/14 1409  Gross per 24 hour  Intake   1590 ml  Output      0 ml  Net   1590 ml    Exam:  General: diffuse facial swelling R face/periorbital area Cardiovascular:  s1 s2 no m/r/g Respiratory: clear  Data Reviewed: Basic Metabolic Panel:  Recent Labs Lab 03/15/14 2040 03/16/14 0559  NA 137 138  K 3.8 4.1  CL 102 102  CO2 23 24  GLUCOSE 125* 159*  BUN 10 11  CREATININE 0.81 0.79  CALCIUM 8.3* 8.6   Liver Function Tests:  Recent Labs Lab 03/16/14 0559  AST 11  ALT 17  ALKPHOS 52  BILITOT <0.2*    PROT 6.0  ALBUMIN 3.1*   No results found for this basename: LIPASE, AMYLASE,  in the last 168 hours No results found for this basename: AMMONIA,  in the last 168 hours CBC:  Recent Labs Lab 03/15/14 2040 03/16/14 0559 03/17/14 0614  WBC 11.2* 9.4 9.3  NEUTROABS 4.6  --  3.9  HGB 14.6 14.4 14.8  HCT 42.0 41.8 42.9  MCV 88.4 88.6 88.1  PLT 148* 148* 153   Cardiac Enzymes: No results found for this basename: CKTOTAL, CKMB, CKMBINDEX, TROPONINI,  in the last 168 hours BNP: No components found with this basename: POCBNP,  CBG: No results found for this basename: GLUCAP,  in the last 168 hours  No results found for this or any previous visit (from the past 240 hour(s)).   Studies:              All Imaging reviewed and is as per above notation   Scheduled Meds: . enoxaparin (LOVENOX) injection  40 mg Subcutaneous Q24H  . vancomycin  1,250 mg Intravenous Q12H   Continuous Infusions: . sodium chloride 75 mL/hr at 03/18/14 0830     Assessment/Plan: Facial cellulitis--s/p drainage in ED 03/14/14-no cultures.  Continue Vancomycin for now. I have asked general surgery to look in on him as I feel that the abscess may be  coming to a head He may need further incision and drainage We will continue IV antibiotics at present  Code Status: full Family Communication: discussed with mother bedside Disposition Plan: inpatient, d/c tele   Verneita Griffes, MD  [facility] Pager [phone] 03/18/2014, 2:54 PM    LOS: 3 days

## 2014-03-18 NOTE — Consult Note (Signed)
Reason for Consult: Facial cellulitis Referring Physician: Hospitalist  Mark Phillips is an 43 y.o. male.  HPI: Patient is a 43 year old white male who underwent incision and drainage of a right facial cheek abscess emergency room several days ago. He was sent home on by mouth Bactrim. He presented back to the hospital due to worsening cellulitis and erythema surrounding his right eye. He was noted to the hospital for further evaluation treatment. A CT scan done previously showed no abscess cavity present. He states that his swelling and redness have decreased, though there is still a hard area just inferior to the right eye.  Past Medical History  Diagnosis Date  . Hypertension   . High cholesterol   . Anginal pain     2010  . GERD (gastroesophageal reflux disease)     hx of  . Arthritis   . PONV (postoperative nausea and vomiting)   . Sleep apnea     Past Surgical History  Procedure Laterality Date  . Colon surgery    . Ankle reconstruction      lt reconstruction and rt scope sx  . Appendectomy  2000  . Wisdom tooth extraction  2011  . Shoulder arthroscopy with subacromial decompression and open rotator c Right 02/25/2013    Procedure: RIGHT SHOULDER ARTHROSCOPY WITH SUBACROMIAL DECOMPRESSION AND MINI OPEN ROTATOR CUFF REPAIR;  Surgeon: Johnn Hai, MD;  Location: [facility];  Service: Orthopedics;  Laterality: Right;    Family History  Problem Relation Age of Onset  . Heart disease    . Arthritis    . Lung disease    . Cancer    . Asthma    . Diabetes      Social History:  reports that he has been smoking Cigarettes.  He has a 19 pack-year smoking history. He quit smokeless tobacco use about 20 years ago. His smokeless tobacco use included Chew. He reports that he does not drink alcohol or use illicit drugs.  Allergies:  Allergies  Allergen Reactions  . Albuterol Swelling    Medications: I have reviewed the patient's current medications.  Results for orders  placed during the hospital encounter of 03/15/14 (from the past 48 hour(s))  CBC WITH DIFFERENTIAL     Status: Abnormal   Collection Time    03/17/14  6:14 AM      Result Value Ref Range   WBC 9.3  4.0 - 10.5 K/uL   RBC 4.87  4.22 - 5.81 MIL/uL   Hemoglobin 14.8  13.0 - 17.0 g/dL   HCT 42.9  39.0 - 52.0 %   MCV 88.1  78.0 - 100.0 fL   MCH 30.4  26.0 - 34.0 pg   MCHC 34.5  30.0 - 36.0 g/dL   RDW 13.1  11.5 - 15.5 %   Platelets 153  150 - 400 K/uL   Neutrophils Relative % 42 (*) 43 - 77 %   Neutro Abs 3.9  1.7 - 7.7 K/uL   Lymphocytes Relative 41  12 - 46 %   Lymphs Abs 3.9  0.7 - 4.0 K/uL   Monocytes Relative 8  3 - 12 %   Monocytes Absolute 0.7  0.1 - 1.0 K/uL   Eosinophils Relative 8 (*) 0 - 5 %   Eosinophils Absolute 0.7  0.0 - 0.7 K/uL   Basophils Relative 1  0 - 1 %   Basophils Absolute 0.1  0.0 - 0.1 K/uL    No results found.  ROS: See  chart Blood pressure 134/82, pulse 60, temperature 98.2 F (36.8 C), temperature source Oral, resp. rate 18, height 5\' 11"  (1.803 m), weight 95.301 kg (210 lb 1.6 oz), SpO2 98.00%. Physical Exam: Pleasant white male no acute distress. HEENT examination reveals full range of motion in the right eye. A small indurated area is noted over the right cheekbone. No fluctuance is noted. There is no active drainage present. There is some erythema extending inferiorly from this.  Assessment/Plan: Impression: Cellulitis, facial, resolving Plan: Agree with IV vancomycin. I do not appreciate an abscess to drain surgically at this time. Hopefully, the IV vancomycin he'll take care of this. We'll reevaluate a.m.  Rosielee Corporan A 03/18/2014, 5:17 PM

## 2014-03-18 NOTE — Progress Notes (Signed)
Patient was resting well on his CPAP;will continue to monitor patient for any discomfort.

## 2014-03-19 MED ORDER — DOXYCYCLINE HYCLATE 100 MG PO TABS
100.0000 mg | ORAL_TABLET | Freq: Two times a day (BID) | ORAL | Status: DC
  Administered 2014-03-19: 100 mg via ORAL
  Filled 2014-03-19: qty 1

## 2014-03-19 MED ORDER — DOXYCYCLINE HYCLATE 100 MG PO TABS: 100.0000 mg | ORAL_TABLET | Freq: Two times a day (BID) | ORAL | Status: AC

## 2014-03-19 NOTE — Progress Notes (Signed)
Erythema and induration has significantly decreased in the right facial cheek area. No fluctuance is noted. No need for incision and drainage at this time. Okay for discharge from surgical standpoint. I would be more happy to see him in my office in followup, or he can see his primary care physician, Dr. Manuella Ghazi.

## 2014-03-19 NOTE — Discharge Planning (Signed)
Pt stated he was ready to go home and he had no pain.  Pt IV removed adn given DC papers with family in room.  Pt educated about how to look for further s/sx of infection and to contact doctor with negative changes.  Pt instructed to PU meds at pharm.  Walked to car by myself and family.

## 2014-03-19 NOTE — Discharge Summary (Signed)
Physician Discharge Summary  Mark Phillips [id number] DOB: 1971/08/09 DOA: 03/15/2014  PCP: Monico Blitz, MD  Admit date: 03/15/2014 Discharge date: 03/19/2014  Time spent: 15 minutes  Recommendations for Outpatient Follow-up:  1. Complete antibiotic 03/29/14   Discharge Diagnoses:  Active Problems:   Facial cellulitis   Discharge Condition: Good  Diet recommendation: Regular  Filed Weights   03/15/14 1830 03/15/14 2330  Weight: 92.987 kg (205 lb) 95.301 kg (210 lb 1.6 oz)    History of present illness:  43 year old occasionally on admitted with right-sided facial swelling after having this incised and drained. Patient was discharged home on Bactrim and came back with for swelling on the right side of face with extension up to the right eye without pain. Maxillofacial CT was done. Patient subsequently placed on IV vancomycin for broad-spectrum coverage  Because of worsening of erythema as well as potential pointing out the erythema, general surgery was consulted. His erythema is improved significantly subsequently and IV vancomycin was discontinued after 5 days of therapy. He was encouraged to complete a total of 14 days of antibiotics including 10 more days of doxycycline 100 twice a day ending 03/29/14 He was felt to maximally benefited from hospital stay and discharged on 6/13  Consultants:  General surgery Procedures:  CT maxillofacial face Antibiotics:  Vancomycin 6/09-6/13 Doxycycline 6/13-6/23   Discharge Exam: Filed Vitals:   03/19/14 0515  BP: 128/78  Pulse: 57  Temp: 97.5 F (36.4 C)  Resp: 20    General: Alert pleasant oriented no apparent distress Cardiovascular: s1 s2 no m/r/g Respiratory: clear, no added sound  Discharge Instructions You were cared for by a hospitalist during your hospital stay. If you have any questions about your discharge medications or the care you received while you were in the hospital after you are discharged, you can call the  unit and asked to speak with the hospitalist on call if the hospitalist that took care of you is not available. Once you are discharged, your primary care physician will handle any further medical issues. Please note that NO REFILLS for any discharge medications will be authorized once you are discharged, as it is imperative that you return to your primary care physician (or establish a relationship with a primary care physician if you do not have one) for your aftercare needs so that they can reassess your need for medications and monitor your lab values.      Discharge Instructions   Diet - low sodium heart healthy    Complete by:  As directed      Discharge instructions    Complete by:  As directed   Please followup with your regular physician for further management I have prescribed for you in antibiotic called doxycycline. This will have to be taken twice a day until 03/29/14 . please complete the full course of antibiotics Please followup with your regular doctor in about one to 2 weeks     Increase activity slowly    Complete by:  As directed             Medication List    STOP taking these medications       HYDROcodone-acetaminophen 5-325 MG per tablet  Commonly known as:  NORCO/VICODIN     sulfamethoxazole-trimethoprim 800-160 MG per tablet  Commonly known as:  BACTRIM DS      TAKE these medications       doxycycline 100 MG tablet  Commonly known as:  VIBRA-TABS  Take 1 tablet (100 mg  total) by mouth every 12 (twelve) hours.     ibuprofen 200 MG tablet  Commonly known as:  ADVIL,MOTRIN  Take 600 mg by mouth every 6 (six) hours as needed.       Allergies  Allergen Reactions  . Albuterol Swelling      The results of significant diagnostics from this hospitalization (including imaging, microbiology, ancillary and laboratory) are listed below for reference.    Significant Diagnostic Studies: Ct Maxillofacial W/cm  03/15/2014   CLINICAL DATA:  Right cheek  cellulitis.  Facial pain.  EXAM: CT MAXILLOFACIAL WITH CONTRAST  TECHNIQUE: Multidetector CT imaging of the maxillofacial structures was performed with intravenous contrast. Multiplanar CT image reconstructions were also generated. A small metallic BB was placed on the right temple in order to reliably differentiate right from left.  CONTRAST:  5mL OMNIPAQUE IOHEXOL 300 MG/ML  SOLN  COMPARISON:  None.  FINDINGS: Moderate cellulitic change over the right malar region. Skin thickening with inflammatory change of the underlying fat. No visible facial vein thrombus. Unremarkable cavernous sinuses and internal jugular vein. No deep malar abscess. No sinus disease. Slight dental caries of right maxillary molar but the cellulitic process does not appear odontogenic. No buccal, parapharyngeal, sublingual, or submandibular space process. Mild right preseptal periorbital soft tissue swelling. Negative orbits. No osteomyelitis. No sinus air-fluid level. Negative middle ear and mastoids. Slight reactive level 1 and level 2 lymph nodes, without deep space abscess. Congenital C2-C3 fusion. Negative intracranial compartment.  IMPRESSION: Facial cellulitis on the right without deep abscess. No evidence for paranasal sinus or odontogenic source.   Electronically Signed   By: Rolla Flatten M.D.   On: 03/15/2014 21:21    Microbiology: No results found for this or any previous visit (from the past 240 hour(s)).   Labs: Basic Metabolic Panel:  Recent Labs Lab 03/15/14 2040 03/16/14 0559  NA 137 138  K 3.8 4.1  CL 102 102  CO2 23 24  GLUCOSE 125* 159*  BUN 10 11  CREATININE 0.81 0.79  CALCIUM 8.3* 8.6   Liver Function Tests:  Recent Labs Lab 03/16/14 0559  AST 11  ALT 17  ALKPHOS 52  BILITOT <0.2*  PROT 6.0  ALBUMIN 3.1*   No results found for this basename: LIPASE, AMYLASE,  in the last 168 hours No results found for this basename: AMMONIA,  in the last 168 hours CBC:  Recent Labs Lab  03/15/14 2040 03/16/14 0559 03/17/14 0614  WBC 11.2* 9.4 9.3  NEUTROABS 4.6  --  3.9  HGB 14.6 14.4 14.8  HCT 42.0 41.8 42.9  MCV 88.4 88.6 88.1  PLT 148* 148* 153   Cardiac Enzymes: No results found for this basename: CKTOTAL, CKMB, CKMBINDEX, TROPONINI,  in the last 168 hours BNP: BNP (last 3 results) No results found for this basename: PROBNP,  in the last 8760 hours CBG: No results found for this basename: GLUCAP,  in the last 168 hours     Signed:  Nita Sells  [facility] 03/19/2014, 5:04 PM

## 2014-03-19 NOTE — Progress Notes (Deleted)
Physician Discharge Summary  Mark Phillips VFI:[id number] DOB: [birth year] DOA: 03/15/2014  PCP: Monico Blitz, MD  Admit date: 03/15/2014 Discharge date: 03/19/2014  Time spent: 15 minutes  Recommendations for Outpatient Follow-up:  1. Complete antibiotic 03/29/14   Discharge Diagnoses:  Active Problems:   Facial cellulitis   Discharge Condition: Good  Diet recommendation: Regular  Filed Weights   03/15/14 1830 03/15/14 2330  Weight: 92.987 kg (205 lb) 95.301 kg (210 lb 1.6 oz)    History of present illness:  43 year old occasionally on admitted with right-sided facial swelling after having this incised and drained. Patient was discharged home on Bactrim and came back with for swelling on the right side of face with extension up to the right eye without pain. Maxillofacial CT was done. Patient subsequently placed on IV vancomycin for broad-spectrum coverage  Because of worsening of erythema as well as potential pointing out the erythema, general surgery was consulted. His erythema is improved significantly subsequently and IV vancomycin was discontinued after 5 days of therapy. He was encouraged to complete a total of 14 days of antibiotics including 10 more days of doxycycline 100 twice a day ending 03/29/14 He was felt to maximally benefited from hospital stay and discharged on 6/13  Consultants:  General surgery Procedures:  CT maxillofacial face Antibiotics:  Vancomycin 6/09-6/13 Doxycycline 6/13-6/23   Discharge Exam: Filed Vitals:   03/19/14 0515  BP: 128/78  Pulse: 57  Temp: 97.5 F (36.4 C)  Resp: 20    General: Alert pleasant oriented no apparent distress Cardiovascular: s1 s2 no m/r/g Respiratory: clear, no added sound  Discharge Instructions You were cared for by a hospitalist during your hospital stay. If you have any questions about your discharge medications or the care you received while you were in the hospital after you are discharged, you can call the  unit and asked to speak with the hospitalist on call if the hospitalist that took care of you is not available. Once you are discharged, your primary care physician will handle any further medical issues. Please note that NO REFILLS for any discharge medications will be authorized once you are discharged, as it is imperative that you return to your primary care physician (or establish a relationship with a primary care physician if you do not have one) for your aftercare needs so that they can reassess your need for medications and monitor your lab values.  Discharge Instructions   Diet - low sodium heart healthy    Complete by:  As directed      Discharge instructions    Complete by:  As directed   Please followup with your regular physician for further management I have prescribed for you in antibiotic called doxycycline. This will have to be taken twice a day until 03/29/14 . please complete the full course of antibiotics Please followup with your regular doctor in about one to 2 weeks     Increase activity slowly    Complete by:  As directed             Medication List    STOP taking these medications       HYDROcodone-acetaminophen 5-325 MG per tablet  Commonly known as:  NORCO/VICODIN     sulfamethoxazole-trimethoprim 800-160 MG per tablet  Commonly known as:  BACTRIM DS      TAKE these medications       doxycycline 100 MG tablet  Commonly known as:  VIBRA-TABS  Take 1 tablet (100 mg total) by mouth every  12 (twelve) hours.     ibuprofen 200 MG tablet  Commonly known as:  ADVIL,MOTRIN  Take 600 mg by mouth every 6 (six) hours as needed.       Allergies  Allergen Reactions  . Albuterol Swelling      The results of significant diagnostics from this hospitalization (including imaging, microbiology, ancillary and laboratory) are listed below for reference.    Significant Diagnostic Studies: Ct Maxillofacial W/cm  03/15/2014   CLINICAL DATA:  Right cheek cellulitis.   Facial pain.  EXAM: CT MAXILLOFACIAL WITH CONTRAST  TECHNIQUE: Multidetector CT imaging of the maxillofacial structures was performed with intravenous contrast. Multiplanar CT image reconstructions were also generated. A small metallic BB was placed on the right temple in order to reliably differentiate right from left.  CONTRAST:  67mL OMNIPAQUE IOHEXOL 300 MG/ML  SOLN  COMPARISON:  None.  FINDINGS: Moderate cellulitic change over the right malar region. Skin thickening with inflammatory change of the underlying fat. No visible facial vein thrombus. Unremarkable cavernous sinuses and internal jugular vein. No deep malar abscess. No sinus disease. Slight dental caries of right maxillary molar but the cellulitic process does not appear odontogenic. No buccal, parapharyngeal, sublingual, or submandibular space process. Mild right preseptal periorbital soft tissue swelling. Negative orbits. No osteomyelitis. No sinus air-fluid level. Negative middle ear and mastoids. Slight reactive level 1 and level 2 lymph nodes, without deep space abscess. Congenital C2-C3 fusion. Negative intracranial compartment.  IMPRESSION: Facial cellulitis on the right without deep abscess. No evidence for paranasal sinus or odontogenic source.   Electronically Signed   By: Rolla Flatten M.D.   On: 03/15/2014 21:21    Microbiology: No results found for this or any previous visit (from the past 240 hour(s)).   Labs: Basic Metabolic Panel:  Recent Labs Lab 03/15/14 2040 03/16/14 0559  NA 137 138  K 3.8 4.1  CL 102 102  CO2 23 24  GLUCOSE 125* 159*  BUN 10 11  CREATININE 0.81 0.79  CALCIUM 8.3* 8.6   Liver Function Tests:  Recent Labs Lab 03/16/14 0559  AST 11  ALT 17  ALKPHOS 52  BILITOT <0.2*  PROT 6.0  ALBUMIN 3.1*   No results found for this basename: LIPASE, AMYLASE,  in the last 168 hours No results found for this basename: AMMONIA,  in the last 168 hours CBC:  Recent Labs Lab 03/15/14 2040  03/16/14 0559 03/17/14 0614  WBC 11.2* 9.4 9.3  NEUTROABS 4.6  --  3.9  HGB 14.6 14.4 14.8  HCT 42.0 41.8 42.9  MCV 88.4 88.6 88.1  PLT 148* 148* 153   Cardiac Enzymes: No results found for this basename: CKTOTAL, CKMB, CKMBINDEX, TROPONINI,  in the last 168 hours BNP: BNP (last 3 results) No results found for this basename: PROBNP,  in the last 8760 hours CBG: No results found for this basename: GLUCAP,  in the last 168 hours     Signed:  Nita Sells  [facility] 03/19/2014, 10:09 AM

## 2014-12-31 ENCOUNTER — Emergency Department (HOSPITAL_COMMUNITY)
Admission: EM | Admit: 2014-12-31 | Discharge: 2014-12-31 | Disposition: A | Discharge status: Home / Self Care | Payer: Managed Care, Other (non HMO) | Attending: Emergency Medicine | Admitting: Emergency Medicine

## 2014-12-31 ENCOUNTER — Encounter (HOSPITAL_COMMUNITY): Payer: Self-pay | Admitting: Emergency Medicine

## 2014-12-31 DIAGNOSIS — Z8639 Personal history of other endocrine, nutritional and metabolic disease: Secondary | ICD-10-CM | POA: Insufficient documentation

## 2014-12-31 DIAGNOSIS — I10 Essential (primary) hypertension: Secondary | ICD-10-CM | POA: Diagnosis not present

## 2014-12-31 DIAGNOSIS — Z7952 Long term (current) use of systemic steroids: Secondary | ICD-10-CM | POA: Diagnosis not present

## 2014-12-31 DIAGNOSIS — M5442 Lumbago with sciatica, left side: Secondary | ICD-10-CM

## 2014-12-31 DIAGNOSIS — Z720 Tobacco use: Secondary | ICD-10-CM | POA: Diagnosis not present

## 2014-12-31 DIAGNOSIS — Z79899 Other long term (current) drug therapy: Secondary | ICD-10-CM | POA: Diagnosis not present

## 2014-12-31 DIAGNOSIS — M545 Low back pain: Secondary | ICD-10-CM | POA: Diagnosis present

## 2014-12-31 DIAGNOSIS — Z8719 Personal history of other diseases of the digestive system: Secondary | ICD-10-CM | POA: Diagnosis not present

## 2014-12-31 DIAGNOSIS — Z8669 Personal history of other diseases of the nervous system and sense organs: Secondary | ICD-10-CM | POA: Diagnosis not present

## 2014-12-31 DIAGNOSIS — M1990 Unspecified osteoarthritis, unspecified site: Secondary | ICD-10-CM | POA: Insufficient documentation

## 2014-12-31 MED ORDER — KETOROLAC TROMETHAMINE 60 MG/2ML IM SOLN
60.0000 mg | Freq: Once | INTRAMUSCULAR | Status: AC
  Administered 2014-12-31: 60 mg via INTRAMUSCULAR
  Filled 2014-12-31: qty 2

## 2014-12-31 MED ORDER — DEXAMETHASONE SODIUM PHOSPHATE 4 MG/ML IJ SOLN
8.0000 mg | Freq: Once | INTRAMUSCULAR | Status: AC
  Administered 2014-12-31: 8 mg via INTRAMUSCULAR
  Filled 2014-12-31: qty 2

## 2014-12-31 MED ORDER — DIAZEPAM 5 MG PO TABS
10.0000 mg | ORAL_TABLET | Freq: Once | ORAL | Status: AC
  Administered 2014-12-31: 10 mg via ORAL
  Filled 2014-12-31: qty 2

## 2014-12-31 NOTE — ED Notes (Signed)
Pt c/o back pain since Wed. Was seen at Urgent Care on Thurs. Pt states he was told he had a pinched nerve. Pt c/o worsening pain at work last night.

## 2014-12-31 NOTE — ED Provider Notes (Signed)
CSN: [id number]     Arrival date & time 12/31/14  1121 History   First MD Initiated Contact with Patient 12/31/14 1208     Chief Complaint  Patient presents with  . Back Pain     (Consider location/radiation/quality/duration/timing/severity/associated sxs/prior Treatment) HPI Comments: Patient is a 44 year old male who presents to the emergency department with complaint of back pain. The patient states that he's been having some problems with back pain in the past. On Wednesday, March 23 he was seen at a local urgent care, and told that he had an "pinched nerve". The patient presents now because he says the pain is worsening and was particularly bad while he was at work on last evening. There's been no loss of bowel or bladder function. His been no recurrent falls since the evaluation on March 23. Patient presents now for assistance with his discomfort. He reports that the burning sensation seems to be getting progressively worse. And he was only able to rest well on last evening.  Patient is a 44 y.o. male presenting with back pain. The history is provided by the patient.  Back Pain Location:  Lumbar spine Quality:  Burning and aching Associated symptoms: no abdominal pain, no chest pain and no dysuria     Past Medical History  Diagnosis Date  . Hypertension   . High cholesterol   . Anginal pain     2010  . GERD (gastroesophageal reflux disease)     hx of  . Arthritis   . PONV (postoperative nausea and vomiting)   . Sleep apnea    Past Surgical History  Procedure Laterality Date  . Colon surgery    . Ankle reconstruction      lt reconstruction and rt scope sx  . Appendectomy  2000  . Wisdom tooth extraction  2011  . Shoulder arthroscopy with subacromial decompression and open rotator c Right 02/25/2013    Procedure: RIGHT SHOULDER ARTHROSCOPY WITH SUBACROMIAL DECOMPRESSION AND MINI OPEN ROTATOR CUFF REPAIR;  Surgeon: Johnn Hai, MD;  Location: [facility];  Service:  Orthopedics;  Laterality: Right;   Family History  Problem Relation Age of Onset  . Heart disease    . Arthritis    . Lung disease    . Cancer    . Asthma    . Diabetes     History  Substance Use Topics  . Smoking status: Current Every Day Smoker -- 1.00 packs/day for 19 years    Types: Cigarettes  . Smokeless tobacco: Former Systems developer    Types: [place] date: 10/07/1993  . Alcohol Use: No    Review of Systems  Constitutional: Negative for activity change.       All ROS Neg except as noted in HPI  HENT: Negative for nosebleeds.   Eyes: Negative for photophobia and discharge.  Respiratory: Negative for cough, shortness of breath and wheezing.   Cardiovascular: Negative for chest pain and palpitations.  Gastrointestinal: Negative for abdominal pain and blood in stool.  Genitourinary: Negative for dysuria, frequency and hematuria.  Musculoskeletal: Positive for back pain and arthralgias. Negative for neck pain.  Skin: Negative.   Neurological: Negative for dizziness, seizures and speech difficulty.  Psychiatric/Behavioral: Negative for hallucinations and confusion.      Allergies  Albuterol  Home Medications   Prior to Admission medications   Medication Sig Start Date End Date Taking? Authorizing Provider  cyclobenzaprine (FLEXERIL) 10 MG tablet Take 1 tablet by mouth 3 (three) times daily  as needed. 12/29/14  Yes Historical Provider, MD  ibuprofen (ADVIL,MOTRIN) 200 MG tablet Take 600 mg by mouth every 6 (six) hours as needed for moderate pain.    Yes Historical Provider, MD  predniSONE (DELTASONE) 5 MG tablet Take 5 mg by mouth daily with breakfast. #21 tablets,taper pack   Yes Historical Provider, MD   BP 146/85 mmHg  Pulse 84  Temp(Src) 97.9 F (36.6 C) (Oral)  Resp 18  Ht 6' (1.829 m)  Wt 200 lb (90.719 kg)  BMI 27.12 kg/m2  SpO2 100% Physical Exam  Constitutional: He is oriented to person, place, and time. He appears well-developed and well-nourished.   Non-toxic appearance.  HENT:  Head: Normocephalic.  Right Ear: Tympanic membrane and external ear normal.  Left Ear: Tympanic membrane and external ear normal.  Eyes: EOM and lids are normal. Pupils are equal, round, and reactive to light.  Neck: Normal range of motion. Neck supple. Carotid bruit is not present.  Cardiovascular: Normal rate, regular rhythm, normal heart sounds, intact distal pulses and normal pulses.   Pulmonary/Chest: Breath sounds normal. No respiratory distress.  Abdominal: Soft. Bowel sounds are normal. There is no tenderness. There is no guarding.  Musculoskeletal:       Lumbar back: He exhibits decreased range of motion, pain and spasm.  Lymphadenopathy:       Head (right side): No submandibular adenopathy present.       Head (left side): No submandibular adenopathy present.    He has no cervical adenopathy.  Neurological: He is alert and oriented to person, place, and time. He has normal strength. No cranial nerve deficit or sensory deficit.  Gait is slow but steady. No foot drop noted. No motor or sensory deficit of the lower extremities.  Skin: Skin is warm and dry.  Psychiatric: He has a normal mood and affect. His speech is normal.  Nursing note and vitals reviewed.   ED Course  Procedures (including critical care time) Labs Review Labs Reviewed - No data to display  Imaging Review No results found.   EKG Interpretation None      MDM  Vital signs are well within normal limits. Pulse oximetry is 100% on room air. Within normal limits by my interpretation.  I have reviewed the x-rays from the urgent care that were completed on March 24. Is no subluxation appreciated. There is no free air noted around the spinal area. The vertebral body heights are well margined and preserved.  No gross neurologic deficit appreciated on today's examination. Suspect the patient has an exacerbation of his back pain. I have encouraged the patient to see orthopedics for  evaluation concerning his lower back pain, and to rule out degenerative disc disease. Patient is been given intramuscular Decadron, Toradol, and oral Valium here in the emergency department. He will continue his current medications. I have also asked him to use a heating pad to the lower back area. Patient is given a work excuse to return on Monday, March 28.    Final diagnoses:  None    **I have reviewed nursing notes, vital signs, and all appropriate lab and imaging results for this patient.Lily Kocher, PA-C 12/31/14 1347  Leonard Schwartz, MD 01/01/15 [phone]

## 2014-12-31 NOTE — Discharge Instructions (Signed)
Please use a heating pad to your lower back. Please rest her back is much as possible. Please see Dr. Lorin Mercy, or the orthopedic specialist of your choice concerning your ongoing back problem. Please continue your medications as ordered by the urgent care physician. You were treated with intramuscular steroid, and anti-inflammatory medication. As well as oral muscle relaxing medications. Please use caution getting around as these medications may cause drowsiness, or lightheadedness. Back Pain, Adult Back pain is very common. The pain often gets better over time. The cause of back pain is usually not dangerous. Most people can learn to manage their back pain on their own.  HOME CARE   Stay active. Start with short walks on flat ground if you can. Try to walk farther each day.  Do not sit, drive, or stand in one place for more than 30 minutes. Do not stay in bed.  Do not avoid exercise or work. Activity can help your back heal faster.  Be careful when you bend or lift an object. Bend at your knees, keep the object close to you, and do not twist.  Sleep on a firm mattress. Lie on your side, and bend your knees. If you lie on your back, put a pillow under your knees.  Only take medicines as told by your doctor.  Put ice on the injured area.  Put ice in a plastic bag.  Place a towel between your skin and the bag.  Leave the ice on for 15-20 minutes, 03-04 times a day for the first 2 to 3 days. After that, you can switch between ice and heat packs.  Ask your doctor about back exercises or massage.  Avoid feeling anxious or stressed. Find good ways to deal with stress, such as exercise. GET HELP RIGHT AWAY IF:   Your pain does not go away with rest or medicine.  Your pain does not go away in 1 week.  You have new problems.  You do not feel well.  The pain spreads into your legs.  You cannot control when you poop (bowel movement) or pee (urinate).  Your arms or legs feel weak or lose  feeling (numbness).  You feel sick to your stomach (nauseous) or throw up (vomit).  You have belly (abdominal) pain.  You feel like you may pass out (faint). MAKE SURE YOU:   Understand these instructions.  Will watch your condition.  Will get help right away if you are not doing well or get worse. Document Released: 03/11/2008 Document Revised: 12/16/2011 Document Reviewed: 01/25/2014 [facility] Patient Information 2015 [place], [place]. This information is not intended to replace advice given to you by your health care provider. Make sure you discuss any questions you have with your health care provider.

## 2015-03-29 ENCOUNTER — Encounter (HOSPITAL_COMMUNITY): Payer: Self-pay | Admitting: Emergency Medicine

## 2015-03-29 ENCOUNTER — Emergency Department (HOSPITAL_COMMUNITY): Payer: Managed Care, Other (non HMO)

## 2015-03-29 ENCOUNTER — Emergency Department (HOSPITAL_COMMUNITY)
Admission: EM | Admit: 2015-03-29 | Discharge: 2015-03-29 | Disposition: A | Discharge status: Home / Self Care | Payer: Managed Care, Other (non HMO) | Attending: Emergency Medicine | Admitting: Emergency Medicine

## 2015-03-29 DIAGNOSIS — Z8679 Personal history of other diseases of the circulatory system: Secondary | ICD-10-CM | POA: Insufficient documentation

## 2015-03-29 DIAGNOSIS — Z720 Tobacco use: Secondary | ICD-10-CM | POA: Diagnosis not present

## 2015-03-29 DIAGNOSIS — Z8669 Personal history of other diseases of the nervous system and sense organs: Secondary | ICD-10-CM | POA: Diagnosis not present

## 2015-03-29 DIAGNOSIS — M791 Myalgia, unspecified site: Secondary | ICD-10-CM

## 2015-03-29 DIAGNOSIS — Z7952 Long term (current) use of systemic steroids: Secondary | ICD-10-CM | POA: Diagnosis not present

## 2015-03-29 DIAGNOSIS — R079 Chest pain, unspecified: Secondary | ICD-10-CM

## 2015-03-29 DIAGNOSIS — I10 Essential (primary) hypertension: Secondary | ICD-10-CM | POA: Diagnosis not present

## 2015-03-29 DIAGNOSIS — Z8739 Personal history of other diseases of the musculoskeletal system and connective tissue: Secondary | ICD-10-CM | POA: Diagnosis not present

## 2015-03-29 DIAGNOSIS — J069 Acute upper respiratory infection, unspecified: Secondary | ICD-10-CM | POA: Insufficient documentation

## 2015-03-29 DIAGNOSIS — Z8719 Personal history of other diseases of the digestive system: Secondary | ICD-10-CM | POA: Diagnosis not present

## 2015-03-29 DIAGNOSIS — E119 Type 2 diabetes mellitus without complications: Secondary | ICD-10-CM | POA: Diagnosis not present

## 2015-03-29 DIAGNOSIS — R0789 Other chest pain: Secondary | ICD-10-CM | POA: Diagnosis not present

## 2015-03-29 DIAGNOSIS — Z8639 Personal history of other endocrine, nutritional and metabolic disease: Secondary | ICD-10-CM | POA: Insufficient documentation

## 2015-03-29 LAB — CBC WITH DIFFERENTIAL/PLATELET
BASOS ABS: 0.1 10*3/uL (ref 0.0–0.1)
BASOS PCT: 1 % (ref 0–1)
EOS ABS: 0.8 10*3/uL — AB (ref 0.0–0.7)
Eosinophils Relative: 6 % — ABNORMAL HIGH (ref 0–5)
HEMATOCRIT: 43.4 % (ref 39.0–52.0)
HEMOGLOBIN: 15.1 g/dL (ref 13.0–17.0)
Lymphocytes Relative: 29 % (ref 12–46)
Lymphs Abs: 4 10*3/uL (ref 0.7–4.0)
MCH: 31 pg (ref 26.0–34.0)
MCHC: 34.8 g/dL (ref 30.0–36.0)
MCV: 89.1 fL (ref 78.0–100.0)
MONO ABS: 1.3 10*3/uL — AB (ref 0.1–1.0)
MONOS PCT: 9 % (ref 3–12)
Neutro Abs: 7.7 10*3/uL (ref 1.7–7.7)
Neutrophils Relative %: 55 % (ref 43–77)
Platelets: 130 10*3/uL — ABNORMAL LOW (ref 150–400)
RBC: 4.87 MIL/uL (ref 4.22–5.81)
RDW: 13 % (ref 11.5–15.5)
WBC: 13.8 10*3/uL — AB (ref 4.0–10.5)

## 2015-03-29 LAB — BASIC METABOLIC PANEL
Anion gap: 10 (ref 5–15)
BUN: 11 mg/dL (ref 6–20)
CALCIUM: 8.9 mg/dL (ref 8.9–10.3)
CO2: 24 mmol/L (ref 22–32)
Chloride: 101 mmol/L (ref 101–111)
Creatinine, Ser: 0.83 mg/dL (ref 0.61–1.24)
GFR calc non Af Amer: >60 mL/min (ref >60)
GLUCOSE: 252 mg/dL — AB (ref 65–99)
Potassium: 3.6 mmol/L (ref 3.5–5.1)
SODIUM: 135 mmol/L (ref 135–145)

## 2015-03-29 LAB — TROPONIN I: Troponin I: <0.03 ng/mL (ref <0.031)

## 2015-03-29 MED ORDER — IBUPROFEN 600 MG PO TABS: 600.0000 mg | ORAL_TABLET | Freq: Four times a day (QID) | ORAL | Status: DC | PRN

## 2015-03-29 MED ORDER — KETOROLAC TROMETHAMINE 30 MG/ML IJ SOLN
30.0000 mg | Freq: Once | INTRAMUSCULAR | Status: AC
  Administered 2015-03-29: 30 mg via INTRAVENOUS
  Filled 2015-03-29: qty 1

## 2015-03-29 NOTE — Discharge Instructions (Signed)
You were seen today for chest pain and upper respiratory symptoms. Your chest pain was thought to be because of an upper respiratory infection that is likely viral.   Your heart tests are reassuring. However, given your risk factors for heart disease, he will be given referral to cardiology for baseline stress testing. use ibuprofen at home as needed for body aches.  Upper Respiratory Infection, Adult An upper respiratory infection (URI) is also sometimes known as the common cold. The upper respiratory tract includes the nose, sinuses, throat, trachea, and bronchi. Bronchi are the airways leading to the lungs. Most people improve within 1 week, but symptoms can last up to 2 weeks. A residual cough may last even longer.  CAUSES Many different viruses can infect the tissues lining the upper respiratory tract. The tissues become irritated and inflamed and often become very moist. Mucus production is also common. A cold is contagious. You can easily spread the virus to others by oral contact. This includes kissing, sharing a glass, coughing, or sneezing. Touching your mouth or nose and then touching a surface, which is then touched by another person, can also spread the virus. SYMPTOMS  Symptoms typically develop 1 to 3 days after you come in contact with a cold virus. Symptoms vary from person to person. They may include:  Runny nose.  Sneezing.  Nasal congestion.  Sinus irritation.  Sore throat.  Loss of voice (laryngitis).  Cough.  Fatigue.  Muscle aches.  Loss of appetite.  Headache.  Low-grade fever. DIAGNOSIS  You might diagnose your own cold based on familiar symptoms, since most people get a cold 2 to 3 times a year. Your caregiver can confirm this based on your exam. Most importantly, your caregiver can check that your symptoms are not due to another disease such as strep throat, sinusitis, pneumonia, asthma, or epiglottitis. Blood tests, throat tests, and X-rays are not  necessary to diagnose a common cold, but they may sometimes be helpful in excluding other more serious diseases. Your caregiver will decide if any further tests are required. RISKS AND COMPLICATIONS  You may be at risk for a more severe case of the common cold if you smoke cigarettes, have chronic heart disease (such as heart failure) or lung disease (such as asthma), or if you have a weakened immune system. The very young and very old are also at risk for more serious infections. Bacterial sinusitis, middle ear infections, and bacterial pneumonia can complicate the common cold. The common cold can worsen asthma and chronic obstructive pulmonary disease (COPD). Sometimes, these complications can require emergency medical care and may be life-threatening. PREVENTION  The best way to protect against getting a cold is to practice good hygiene. Avoid oral or hand contact with people with cold symptoms. Wash your hands often if contact occurs. There is no clear evidence that vitamin C, vitamin E, echinacea, or exercise reduces the chance of developing a cold. However, it is always recommended to get plenty of rest and practice good nutrition. TREATMENT  Treatment is directed at relieving symptoms. There is no cure. Antibiotics are not effective, because the infection is caused by a virus, not by bacteria. Treatment may include:  Increased fluid intake. Sports drinks offer valuable electrolytes, sugars, and fluids.  Breathing heated mist or steam (vaporizer or shower).  Eating chicken soup or other clear broths, and maintaining good nutrition.  Getting plenty of rest.  Using gargles or lozenges for comfort.  Controlling fevers with ibuprofen or acetaminophen as directed  by your caregiver.  Increasing usage of your inhaler if you have asthma. Zinc gel and zinc lozenges, taken in the first 24 hours of the common cold, can shorten the duration and lessen the severity of symptoms. Pain medicines may help  with fever, muscle aches, and throat pain. A variety of non-prescription medicines are available to treat congestion and runny nose. Your caregiver can make recommendations and may suggest nasal or lung inhalers for other symptoms.  HOME CARE INSTRUCTIONS   Only take over-the-counter or prescription medicines for pain, discomfort, or fever as directed by your caregiver.  Use a warm mist humidifier or inhale steam from a shower to increase air moisture. This may keep secretions moist and make it easier to breathe.  Drink enough water and fluids to keep your urine clear or pale yellow.  Rest as needed.  Return to work when your temperature has returned to normal or as your caregiver advises. You may need to stay home longer to avoid infecting others. You can also use a face mask and careful hand washing to prevent spread of the virus. SEEK MEDICAL CARE IF:   After the first few days, you feel you are getting worse rather than better.  You need your caregiver's advice about medicines to control symptoms.  You develop chills, worsening shortness of breath, or brown or red sputum. These may be signs of pneumonia.  You develop yellow or brown nasal discharge or pain in the face, especially when you bend forward. These may be signs of sinusitis.  You develop a fever, swollen neck glands, pain with swallowing, or white areas in the back of your throat. These may be signs of strep throat. SEEK IMMEDIATE MEDICAL CARE IF:   You have a fever.  You develop severe or persistent headache, ear pain, sinus pain, or chest pain.  You develop wheezing, a prolonged cough, cough up blood, or have a change in your usual mucus (if you have chronic lung disease).  You develop sore muscles or a stiff neck. Document Released: 03/19/2001 Document Revised: 12/16/2011 Document Reviewed: 12/29/2013 [facility] Patient Information 2015 [place], [place]. This information is not intended to replace advice given to you by  your health care provider. Make sure you discuss any questions you have with your health care provider. Chest Pain (Nonspecific) It is often hard to give a specific diagnosis for the cause of chest pain. There is always a chance that your pain could be related to something serious, such as a heart attack or a blood clot in the lungs. You need to follow up with your health care provider for further evaluation. CAUSES   Heartburn.  Pneumonia or bronchitis.  Anxiety or stress.  Inflammation around your heart (pericarditis) or lung (pleuritis or pleurisy).  A blood clot in the lung.  A collapsed lung (pneumothorax). It can develop suddenly on its own (spontaneous pneumothorax) or from trauma to the chest.  Shingles infection (herpes zoster virus). The chest wall is composed of bones, muscles, and cartilage. Any of these can be the source of the pain.  The bones can be bruised by injury.  The muscles or cartilage can be strained by coughing or overwork.  The cartilage can be affected by inflammation and become sore (costochondritis). DIAGNOSIS  Lab tests or other studies may be needed to find the cause of your pain. Your health care provider may have you take a test called an ambulatory electrocardiogram (ECG). An ECG records your heartbeat patterns over a 24-hour period.  You may also have other tests, such as:  Transthoracic echocardiogram (TTE). During echocardiography, sound waves are used to evaluate how blood flows through your heart.  Transesophageal echocardiogram (TEE).  Cardiac monitoring. This allows your health care provider to monitor your heart rate and rhythm in real time.  Holter monitor. This is a portable device that records your heartbeat and can help diagnose heart arrhythmias. It allows your health care provider to track your heart activity for several days, if needed.  Stress tests by exercise or by giving medicine that makes the heart beat faster. TREATMENT    Treatment depends on what may be causing your chest pain. Treatment may include:  Acid blockers for heartburn.  Anti-inflammatory medicine.  Pain medicine for inflammatory conditions.  Antibiotics if an infection is present.  You may be advised to change lifestyle habits. This includes stopping smoking and avoiding alcohol, caffeine, and chocolate.  You may be advised to keep your head raised (elevated) when sleeping. This reduces the chance of acid going backward from your stomach into your esophagus. Most of the time, nonspecific chest pain will improve within 2-3 days with rest and mild pain medicine.  HOME CARE INSTRUCTIONS   If antibiotics were prescribed, take them as directed. Finish them even if you start to feel better.  For the next few days, avoid physical activities that bring on chest pain. Continue physical activities as directed.  Do not use any tobacco products, including cigarettes, chewing tobacco, or electronic cigarettes.  Avoid drinking alcohol.  Only take medicine as directed by your health care provider.  Follow your health care provider's suggestions for further testing if your chest pain does not go away.  Keep any follow-up appointments you made. If you do not go to an appointment, you could develop lasting (chronic) problems with pain. If there is any problem keeping an appointment, call to reschedule. SEEK MEDICAL CARE IF:   Your chest pain does not go away, even after treatment.  You have a rash with blisters on your chest.  You have a fever. SEEK IMMEDIATE MEDICAL CARE IF:   You have increased chest pain or pain that spreads to your arm, neck, jaw, back, or abdomen.  You have shortness of breath.  You have an increasing cough, or you cough up blood.  You have severe back or abdominal pain.  You feel nauseous or vomit.  You have severe weakness.  You faint.  You have chills. This is an emergency. Do not wait to see if the pain will  go away. Get medical help at once. Call your local emergency services (911 in [place] Do not drive yourself to the hospital. MAKE SURE YOU:   Understand these instructions.  Will watch your condition.  Will get help right away if you are not doing well or get worse. Document Released: 07/03/2005 Document Revised: 09/28/2013 Document Reviewed: 04/28/2008 [facility] Patient Information 2015 [place], [place]. This information is not intended to replace advice given to you by your health care provider. Make sure you discuss any questions you have with your health care provider.

## 2015-03-29 NOTE — ED Notes (Signed)
Pt c/o body aches, fever, and chest pain since the weekend.

## 2015-03-29 NOTE — ED Provider Notes (Signed)
CSN: [id number]     Arrival date & time 03/29/15  0240 History   First MD Initiated Contact with Patient 03/29/15 0248     Chief Complaint  Patient presents with  . Chest Pain     (Consider location/radiation/quality/duration/timing/severity/associated sxs/prior Treatment) HPI   this 44 year old male with history of hypertension, high cholesterol , diabetes who presents with body aches, fever, congestion, cough, and chest pain. Patient reports onset of symptoms this weekend. He states that his whole family had upper respiratory and sinus symptoms. He states he stayed in bed all weekend with myalgias. He was feeling better yesterday and went to work. However, when he woke up at approximately 9 PM he states he felt bad again. He has had a cough which is productive. He reports chills and subjective fevers at home. No documented fevers. He also reports left upper chest pain which is somewhat worse with breathing and movements that started approximately 9 PM. He took ibuprofen, Sudafed, and aspirin prior to arrival without much relief.   Current pain is 7 out of 10. Has any shortness of breath, diaphoresis, nausea, vomiting.  Past Medical History  Diagnosis Date  . Hypertension   . High cholesterol   . Anginal pain     2010  . GERD (gastroesophageal reflux disease)     hx of  . Arthritis   . PONV (postoperative nausea and vomiting)   . Sleep apnea   . Diabetes mellitus without complication    Past Surgical History  Procedure Laterality Date  . Colon surgery    . Ankle reconstruction      lt reconstruction and rt scope sx  . Appendectomy  2000  . Wisdom tooth extraction  2011  . Shoulder arthroscopy with subacromial decompression and open rotator c Right 02/25/2013    Procedure: RIGHT SHOULDER ARTHROSCOPY WITH SUBACROMIAL DECOMPRESSION AND MINI OPEN ROTATOR CUFF REPAIR;  Surgeon: Johnn Hai, MD;  Location: [facility];  Service: Orthopedics;  Laterality: Right;   Family History   Problem Relation Age of Onset  . Heart disease    . Arthritis    . Lung disease    . Cancer    . Asthma    . Diabetes     History  Substance Use Topics  . Smoking status: Current Every Day Smoker -- 1.00 packs/day for 19 years    Types: Cigarettes  . Smokeless tobacco: Former Systems developer    Types: [place] date: 10/07/1993  . Alcohol Use: No    Review of Systems  Constitutional: Positive for chills. Negative for fever.  HENT: Positive for congestion and sinus pressure.   Respiratory: Positive for cough and chest tightness. Negative for shortness of breath.   Cardiovascular: Negative.  Negative for chest pain.  Gastrointestinal: Negative.  Negative for nausea, vomiting and abdominal pain.  Genitourinary: Negative.  Negative for dysuria.  Musculoskeletal: Positive for myalgias.  Skin: Negative for rash.  Neurological: Negative for headaches.  All other systems reviewed and are negative.     Allergies  Albuterol  Home Medications   Prior to Admission medications   Medication Sig Start Date End Date Taking? Authorizing Provider  cyclobenzaprine (FLEXERIL) 10 MG tablet Take 1 tablet by mouth 3 (three) times daily as needed. 12/29/14   Historical Provider, MD  ibuprofen (ADVIL,MOTRIN) 600 MG tablet Take 1 tablet (600 mg total) by mouth every 6 (six) hours as needed. 03/29/15   Merryl Hacker, MD  predniSONE (DELTASONE) 5 MG tablet  Take 5 mg by mouth daily with breakfast. #21 tablets,taper pack    Historical Provider, MD   BP 143/82 mmHg  Pulse 73  Temp(Src) 97.9 F (36.6 C) (Oral)  Resp 18  Ht 5\' 11"  (1.803 m)  Wt 200 lb (90.719 kg)  BMI 27.91 kg/m2  SpO2 99% Physical Exam  Constitutional: He is oriented to person, place, and time. He appears well-developed and well-nourished. No distress.  HENT:  Head: Normocephalic and atraumatic.  Mouth/Throat: Oropharynx is clear and moist. No oropharyngeal exudate.  Eyes: EOM are normal. Pupils are equal, round, and reactive  to light.  Neck: Neck supple.  Cardiovascular: Normal rate, regular rhythm and normal heart sounds.   No murmur heard. Pulmonary/Chest: Effort normal and breath sounds normal. No respiratory distress. He has no wheezes. He exhibits tenderness.  Abdominal: Soft. Bowel sounds are normal. There is no tenderness. There is no rebound.  Musculoskeletal: He exhibits no edema.  Lymphadenopathy:    He has no cervical adenopathy.  Neurological: He is alert and oriented to person, place, and time.  Skin: Skin is warm and dry.  Psychiatric: He has a normal mood and affect.  Nursing note and vitals reviewed.   ED Course  Procedures (including critical care time) Labs Review Labs Reviewed  CBC WITH DIFFERENTIAL/PLATELET - Abnormal; Notable for the following:    WBC 13.8 (*)    Platelets 130 (*)    Monocytes Absolute 1.3 (*)    Eosinophils Relative 6 (*)    Eosinophils Absolute 0.8 (*)    All other components within normal limits  BASIC METABOLIC PANEL - Abnormal; Notable for the following:    Glucose, Bld 252 (*)    All other components within normal limits  TROPONIN I    Imaging Review Dg Chest 2 View  03/29/2015   CLINICAL DATA:  Patient complaining of body aches, fever and chest pain.  EXAM: CHEST  2 VIEW  COMPARISON:  Chest radiograph 08/08/2012  FINDINGS: Monitoring leads overlie the patient. Stable cardiac and mediastinal contours. No consolidative pulmonary opacities. No pleural effusion or pneumothorax. Regional skeleton is unremarkable.  IMPRESSION: No acute cardiopulmonary process.   Electronically Signed   By: Lovey Newcomer M.D.   On: 03/29/2015 03:44     EKG Interpretation   Date/Time:  Wednesday March 29 2015 02:50:17 EDT Ventricular Rate:  69 PR Interval:  179 QRS Duration: 84 QT Interval:  363 QTC Calculation: 389 R Axis:   6 Text Interpretation:  Sinus rhythm No significant change since last  tracing Confirmed by Babygirl Trager  MD, April Colter ([id number]) on 03/29/2015 3:03:00 AM       MDM   Final diagnoses:  Upper respiratory infection  Myalgia  Chest pain, unspecified chest pain type    patient presents with upper respiratory complaints this started over the weekend. He also reports chest pain that started approximately 6 hours prior to arrival. Nontoxic on exam. Afebrile. Vital signs are reassuring. Patient does have multiple risk factors for ACS; however, chest pain is very atypical and is related to upper respiratory symptoms. He has reproducible chest pain on exam. EKG is normal. Chest x-ray shows no evidence of pneumonia or pneumothorax. White count notable for mild leukocytosis and hyperglycemia. Troponin is negative. Patient was given Toradol with slight improvement of his pain. While patient has multiple risk factors for heart disease, do not feel his symptoms are consistent with ACS. I feel they're likely related to a viral URI. Discussed workup with the patient. He is  to follow-up with his primary physician. Given his cardiac risk factors, will also refer to urology for outpatient stress testing to obtain a baseline.  Patient was offered an inhaler for his cough which he declined.Patient to continue supportive care at home.  After history, exam, and medical workup I feel the patient has been appropriately medically screened and is safe for discharge home. Pertinent diagnoses were discussed with the patient. Patient was given return precautions.   Merryl Hacker, MD 03/29/15 [phone]

## 2015-05-30 ENCOUNTER — Other Ambulatory Visit (HOSPITAL_COMMUNITY): Payer: Self-pay | Admitting: Respiratory Therapy

## 2015-05-30 DIAGNOSIS — R0683 Snoring: Secondary | ICD-10-CM

## 2015-05-31 ENCOUNTER — Other Ambulatory Visit (HOSPITAL_COMMUNITY): Payer: Self-pay | Admitting: Respiratory Therapy

## 2015-06-06 ENCOUNTER — Encounter (HOSPITAL_COMMUNITY): Payer: Self-pay

## 2015-06-06 ENCOUNTER — Emergency Department (HOSPITAL_COMMUNITY): Payer: Managed Care, Other (non HMO)

## 2015-06-06 ENCOUNTER — Emergency Department (HOSPITAL_COMMUNITY)
Admission: EM | Admit: 2015-06-06 | Discharge: 2015-06-07 | Disposition: A | Discharge status: Home / Self Care | Payer: Managed Care, Other (non HMO) | Attending: Emergency Medicine | Admitting: Emergency Medicine

## 2015-06-06 DIAGNOSIS — Z720 Tobacco use: Secondary | ICD-10-CM | POA: Insufficient documentation

## 2015-06-06 DIAGNOSIS — R0789 Other chest pain: Secondary | ICD-10-CM | POA: Diagnosis not present

## 2015-06-06 DIAGNOSIS — Z8719 Personal history of other diseases of the digestive system: Secondary | ICD-10-CM | POA: Diagnosis not present

## 2015-06-06 DIAGNOSIS — E119 Type 2 diabetes mellitus without complications: Secondary | ICD-10-CM | POA: Insufficient documentation

## 2015-06-06 DIAGNOSIS — I10 Essential (primary) hypertension: Secondary | ICD-10-CM | POA: Insufficient documentation

## 2015-06-06 DIAGNOSIS — M1990 Unspecified osteoarthritis, unspecified site: Secondary | ICD-10-CM | POA: Diagnosis not present

## 2015-06-06 DIAGNOSIS — Z8669 Personal history of other diseases of the nervous system and sense organs: Secondary | ICD-10-CM | POA: Diagnosis not present

## 2015-06-06 DIAGNOSIS — R079 Chest pain, unspecified: Secondary | ICD-10-CM | POA: Diagnosis present

## 2015-06-06 LAB — BASIC METABOLIC PANEL
Anion gap: 7 (ref 5–15)
BUN: 10 mg/dL (ref 6–20)
CO2: 25 mmol/L (ref 22–32)
CREATININE: 0.79 mg/dL (ref 0.61–1.24)
Calcium: 9.3 mg/dL (ref 8.9–10.3)
Chloride: 103 mmol/L (ref 101–111)
GFR calc Af Amer: >60 mL/min (ref >60)
GFR calc non Af Amer: >60 mL/min (ref >60)
GLUCOSE: 260 mg/dL — AB (ref 65–99)
Potassium: 3.3 mmol/L — ABNORMAL LOW (ref 3.5–5.1)
Sodium: 135 mmol/L (ref 135–145)

## 2015-06-06 LAB — RAPID URINE DRUG SCREEN, HOSP PERFORMED
AMPHETAMINES: NOT DETECTED
BENZODIAZEPINES: NOT DETECTED
Barbiturates: NOT DETECTED
Cocaine: NOT DETECTED
OPIATES: NOT DETECTED
TETRAHYDROCANNABINOL: NOT DETECTED

## 2015-06-06 LAB — CBC WITH DIFFERENTIAL/PLATELET
Basophils Absolute: 0.1 10*3/uL (ref 0.0–0.1)
Basophils Relative: 1 % (ref 0–1)
EOS ABS: 0.8 10*3/uL — AB (ref 0.0–0.7)
Eosinophils Relative: 7 % — ABNORMAL HIGH (ref 0–5)
HCT: 45.9 % (ref 39.0–52.0)
Hemoglobin: 16.2 g/dL (ref 13.0–17.0)
LYMPHS ABS: 5.6 10*3/uL — AB (ref 0.7–4.0)
LYMPHS PCT: 47 % — AB (ref 12–46)
MCH: 31.3 pg (ref 26.0–34.0)
MCHC: 35.3 g/dL (ref 30.0–36.0)
MCV: 88.8 fL (ref 78.0–100.0)
MONOS PCT: 8 % (ref 3–12)
Monocytes Absolute: 1 10*3/uL (ref 0.1–1.0)
Neutro Abs: 4.3 10*3/uL (ref 1.7–7.7)
Neutrophils Relative %: 37 % — ABNORMAL LOW (ref 43–77)
PLATELETS: 153 10*3/uL (ref 150–400)
RBC: 5.17 MIL/uL (ref 4.22–5.81)
RDW: 12.8 % (ref 11.5–15.5)
WBC: 11.8 10*3/uL — ABNORMAL HIGH (ref 4.0–10.5)

## 2015-06-06 LAB — TROPONIN I: Troponin I: <0.03 ng/mL (ref <0.031)

## 2015-06-06 MED ORDER — ACETAMINOPHEN 500 MG PO TABS
1000.0000 mg | ORAL_TABLET | Freq: Once | ORAL | Status: AC
Start: 2015-06-06 — End: 2015-06-06
  Administered 2015-06-06: 1000 mg via ORAL
  Filled 2015-06-06: qty 2

## 2015-06-06 MED ORDER — NITROGLYCERIN 2 % TD OINT
1.0000 [in_us] | TOPICAL_OINTMENT | Freq: Once | TRANSDERMAL | Status: AC
  Administered 2015-06-06: 1 [in_us] via TOPICAL
  Filled 2015-06-06: qty 1

## 2015-06-06 MED ORDER — ASPIRIN 81 MG PO CHEW
324.0000 mg | CHEWABLE_TABLET | Freq: Once | ORAL | Status: AC
Start: 2015-06-06 — End: 2015-06-06
  Administered 2015-06-06: 324 mg via ORAL
  Filled 2015-06-06: qty 4

## 2015-06-06 NOTE — ED Notes (Signed)
I was laying down and my heart felt funny like it was flipping per pt. My blood pressure was elevate and my pulse was irregular per spouse. He went to the MD last week and they said he had a heart attack showing on the EKG. Goes to see a cardiologist in October.

## 2015-06-07 LAB — TROPONIN I: Troponin I: <0.03 ng/mL (ref <0.031)

## 2015-06-07 NOTE — ED Notes (Signed)
Patient verbalizes understanding of discharge instructions, home care and follow up care. Patient ambulatory out of department at this time with mother.

## 2015-06-07 NOTE — Discharge Instructions (Signed)
Your tests tonight do not show evidence that you are having a heart attack tonight. Please either call the cardiology office to get an appointment or call Dr Lanice Shirts office to have them get you an appointment with the cardiologist for further evaluation. Take a baby aspirin a day.  Chest Pain (Nonspecific) It is often hard to give a diagnosis for the cause of chest pain. There is always a chance that your pain could be related to something serious, such as a heart attack or a blood clot in the lungs. You need to follow up with your doctor. HOME CARE  If antibiotic medicine was given, take it as directed by your doctor. Finish the medicine even if you start to feel better.  For the next few days, avoid activities that bring on chest pain. Continue physical activities as told by your doctor.  Do not use any tobacco products. This includes cigarettes, chewing tobacco, and e-cigarettes.  Avoid drinking alcohol.  Only take medicine as told by your doctor.  Follow your doctor's suggestions for more testing if your chest pain does not go away.  Keep all doctor visits you made. GET HELP IF:  Your chest pain does not go away, even after treatment.  You have a rash with blisters on your chest.  You have a fever. GET HELP RIGHT AWAY IF:   You have more pain or pain that spreads to your arm, neck, jaw, back, or belly (abdomen).  You have shortness of breath.  You cough more than usual or cough up blood.  You have very bad back or belly pain.  You feel sick to your stomach (nauseous) or throw up (vomit).  You have very bad weakness.  You pass out (faint).  You have chills. This is an emergency. Do not wait to see if the problems will go away. Call your local emergency services (911 in [place] Do not drive yourself to the hospital. MAKE SURE YOU:   Understand these instructions.  Will watch your condition.  Will get help right away if you are not doing well or get worse. Document  Released: 03/11/2008 Document Revised: 09/28/2013 Document Reviewed: 03/11/2008 [facility] Patient Information 2015 [place], [place]. This information is not intended to replace advice given to you by your health care provider. Make sure you discuss any questions you have with your health care provider.

## 2015-06-07 NOTE — ED Provider Notes (Signed)
CSN: [id number]     Arrival date & time 06/06/15  2054 History   First MD Initiated Contact with Patient 06/06/15 2330     Chief Complaint  Patient presents with  . Chest Pain     (Consider location/radiation/quality/duration/timing/severity/associated sxs/prior Treatment) HPI patient states he was laying down about 7 PM and felt like his heart was fluttering. He states his left arm felt funny and felt like it was fluttering also. He denies any numbness or tingling in his arm. He states his arm felt like it was not getting enough blood. He denies feeling dizzy or lightheaded. He states he had pain just to the left of center in his mid chest that he describes as being sharp and like something pressing on the bone that lasted 10-12 minutes. He states he has some pain in his left upper arm that developed while he was driving to the ED and that is still present. He states it feels like a knotted muscle. He states he had the heart fluttering once before but it did not last long and he did not see his physician about it. He states he still has some mild pain in his chest he now describes as aching. He states nothing makes the pain feel worse, however pressing on his chest makes it feel better. He has mild shortness of breath. He denies diaphoresis, nausea, or vomiting. He states now he feels like he has a "void" in each chest because he doesn't feel his heart beating anymore.  Patient states he has a new physician and when he saw him last week he was told he had had an heart attack on his EKG. He is being referred to cardiology.  Patient states he had an eye exam and was told by the ophthalmologist he had increased cholesterol however he has not been on medicine for it. He has had hypertension in the past although his blood pressure is normal here tonight. There is a strong family history of heart disease in his mother's family. His mother who is present in the room has had cardiac stents, his maternal  grandfather died age 77 from heart disease, and 2 maternal uncles have had CABG.  PCP Dr Anastasio Champion  Past Medical History  Diagnosis Date  . Hypertension   . High cholesterol   . Anginal pain     2010  . GERD (gastroesophageal reflux disease)     hx of  . Arthritis   . PONV (postoperative nausea and vomiting)   . Sleep apnea   . Diabetes mellitus without complication    Past Surgical History  Procedure Laterality Date  . Colon surgery    . Ankle reconstruction      lt reconstruction and rt scope sx  . Appendectomy  2000  . Wisdom tooth extraction  2011  . Shoulder arthroscopy with subacromial decompression and open rotator c Right 02/25/2013    Procedure: RIGHT SHOULDER ARTHROSCOPY WITH SUBACROMIAL DECOMPRESSION AND MINI OPEN ROTATOR CUFF REPAIR;  Surgeon: Johnn Hai, MD;  Location: [facility];  Service: Orthopedics;  Laterality: Right;   Family History  Problem Relation Age of Onset  . Heart disease    . Arthritis    . Lung disease    . Cancer    . Asthma    . Diabetes     Social History  Substance Use Topics  . Smoking status: Current Every Day Smoker -- 1.00 packs/day for 19 years    Types: Cigarettes  . Smokeless  tobacco: Former Systems developer    Types: Chew    Quit date: 10/07/1993  . Alcohol Use: No  employed  Review of Systems  All other systems reviewed and are negative.     Allergies  Albuterol  Home Medications   Prior to Admission medications   Medication Sig Start Date End Date Taking? Authorizing Provider  ibuprofen (ADVIL,MOTRIN) 200 MG tablet Take 800 mg by mouth every 6 (six) hours as needed for mild pain or moderate pain.   Yes Historical Provider, MD   BP 130/72 mmHg  Pulse 62  Temp(Src) 98.2 F (36.8 C) (Oral)  Resp 26  Ht 5\' 11"  (1.803 m)  Wt 200 lb (90.719 kg)  BMI 27.91 kg/m2  SpO2 93%  Vital signs normal   Physical Exam  Constitutional: He is oriented to person, place, and time. He appears well-developed and well-nourished.   Non-toxic appearance. He does not appear ill. No distress.  HENT:  Head: Normocephalic and atraumatic.  Right Ear: External ear normal.  Left Ear: External ear normal.  Nose: Nose normal. No mucosal edema or rhinorrhea.  Mouth/Throat: Oropharynx is clear and moist and mucous membranes are normal. No dental abscesses or uvula swelling.  Eyes: Conjunctivae and EOM are normal. Pupils are equal, round, and reactive to light.  Neck: Normal range of motion and full passive range of motion without pain. Neck supple.  Cardiovascular: Normal rate, regular rhythm and normal heart sounds.  Exam reveals no gallop and no friction rub.   No murmur heard. Pulmonary/Chest: Effort normal and breath sounds normal. No respiratory distress. He has no wheezes. He has no rhonchi. He has no rales. He exhibits no tenderness and no crepitus.    Area of pain noted  Abdominal: Soft. Normal appearance and bowel sounds are normal. He exhibits no distension. There is no tenderness. There is no rebound and no guarding.  Musculoskeletal: Normal range of motion. He exhibits no edema or tenderness.  Moves all extremities well.   Neurological: He is alert and oriented to person, place, and time. He has normal strength. No cranial nerve deficit.  Skin: Skin is warm, dry and intact. No rash noted. No erythema. No pallor.  Psychiatric: He has a normal mood and affect. His speech is normal and behavior is normal. His mood appears not anxious.  Nursing note and vitals reviewed.   ED Course  Procedures (including critical care time)  Medications  aspirin chewable tablet 324 mg (324 mg Oral Given 06/06/15 2355)  nitroGLYCERIN (NITROGLYN) 2 % ointment 1 inch (1 inch Topical Given 06/06/15 2356)  acetaminophen (TYLENOL) tablet 1,000 mg (1,000 mg Oral Given 06/06/15 2355)   Patient noted to have PVCs and PACs on the monitor during the course of interview.  Patient was given aspirin to chew. He was given nitroglycerin paste with  acetaminophen.  At time of discharge patient was given his test results. He states his pain has been gone. His delta troponins are negative. Due to his strong family history heart disease and his smoking history and presumed high cholesterol patient strongly encourage follow up with cardiology. He was given the number for the cardiologist in the hospital.  Labs Review Results for orders placed or performed during the hospital encounter of 06/06/15  Urine rapid drug screen (hosp performed)  Result Value Ref Range   Opiates NONE DETECTED NONE DETECTED   Cocaine NONE DETECTED NONE DETECTED   Benzodiazepines NONE DETECTED NONE DETECTED   Amphetamines NONE DETECTED NONE DETECTED   Tetrahydrocannabinol  NONE DETECTED NONE DETECTED   Barbiturates NONE DETECTED NONE DETECTED  Troponin I  Result Value Ref Range   Troponin I <0.03 <0.031 ng/mL  Basic metabolic panel  Result Value Ref Range   Sodium 135 135 - 145 mmol/L   Potassium 3.3 (L) 3.5 - 5.1 mmol/L   Chloride 103 101 - 111 mmol/L   CO2 25 22 - 32 mmol/L   Glucose, Bld 260 (H) 65 - 99 mg/dL   BUN 10 6 - 20 mg/dL   Creatinine, Ser 0.79 0.61 - 1.24 mg/dL   Calcium 9.3 8.9 - 10.3 mg/dL   GFR calc non Af Amer >60 >60 mL/min   GFR calc Af Amer >60 >60 mL/min   Anion gap 7 5 - 15  CBC with Differential  Result Value Ref Range   WBC 11.8 (H) 4.0 - 10.5 K/uL   RBC 5.17 4.22 - 5.81 MIL/uL   Hemoglobin 16.2 13.0 - 17.0 g/dL   HCT 45.9 39.0 - 52.0 %   MCV 88.8 78.0 - 100.0 fL   MCH 31.3 26.0 - 34.0 pg   MCHC 35.3 30.0 - 36.0 g/dL   RDW 12.8 11.5 - 15.5 %   Platelets 153 150 - 400 K/uL   Neutrophils Relative % 37 (L) 43 - 77 %   Neutro Abs 4.3 1.7 - 7.7 K/uL   Lymphocytes Relative 47 (H) 12 - 46 %   Lymphs Abs 5.6 (H) 0.7 - 4.0 K/uL   Monocytes Relative 8 3 - 12 %   Monocytes Absolute 1.0 0.1 - 1.0 K/uL   Eosinophils Relative 7 (H) 0 - 5 %   Eosinophils Absolute 0.8 (H) 0.0 - 0.7 K/uL   Basophils Relative 1 0 - 1 %   Basophils  Absolute 0.1 0.0 - 0.1 K/uL  Troponin I  Result Value Ref Range   Troponin I <0.03 <0.031 ng/mL   Laboratory interpretation all normal except leukocytosis, mild hypokalemia     Imaging Review Dg Chest 2 View  06/06/2015   CLINICAL DATA:  Patient with irregular sensation to the heart. Elevated blood pressure.  EXAM: CHEST  2 VIEW  COMPARISON:  Chest radiograph 03/29/2015  FINDINGS: Monitoring leads overlie the patient. Stable cardiac and mediastinal contours. No consolidative pulmonary opacities. No pleural effusion or pneumothorax. Regional skeleton is unremarkable. Postsurgical change proximal right humerus.  IMPRESSION: No acute cardiopulmonary process.   Electronically Signed   By: Lovey Newcomer M.D.   On: 06/06/2015 21:53   I have personally reviewed and evaluated these images and lab results as part of my medical decision-making.   EKG Interpretation   Date/Time:  Tuesday June 06 2015 21:05:32 EDT Ventricular Rate:  80 PR Interval:  174 QRS Duration: 83 QT Interval:  355 QTC Calculation: 409 R Axis:   13 Text Interpretation:  Sinus rhythm Abnormal inferior Q waves No  significant change since last tracing 29 Mar 2015 and 07 Aug 2012  Confirmed by [facility]  MD-I, Kaiden Pech ([id number]) on 06/06/2015 11:07:13 PM      MDM   Final diagnoses:  Atypical chest pain    meds  OTC ASA daily  Plan discharge  Rolland Porter, MD, Barbette Or, MD 06/07/15 [phone]

## 2015-07-17 ENCOUNTER — Ambulatory Visit: Payer: Managed Care, Other (non HMO) | Attending: Internal Medicine | Admitting: Sleep Medicine

## 2015-07-17 DIAGNOSIS — G4761 Periodic limb movement disorder: Secondary | ICD-10-CM | POA: Insufficient documentation

## 2015-07-17 DIAGNOSIS — G4733 Obstructive sleep apnea (adult) (pediatric): Secondary | ICD-10-CM | POA: Insufficient documentation

## 2015-07-17 DIAGNOSIS — R0683 Snoring: Secondary | ICD-10-CM

## 2015-07-22 NOTE — Sleep Study (Signed)
  [place] A. Merlene Laughter, MD     [email or web address]             NOCTURNAL POLYSOMNOGRAPHY   LOCATION: [facility]   Patient Name: Mark Phillips, Mark Phillips Date: 07/17/2015 Gender: Male D.O.B: 1970-10-25 Age (years): 42 Referring Provider: Not Available Height (inches): 72 Interpreting Physician: Phillips Odor MD, ABSM Weight (lbs): 195 RPSGT: Rosebud Poles BMI: 26 MRN: [id number] Neck Size: 16.50 CLINICAL INFORMATION Sleep Study Type: NPSG  Indication for sleep study: Snoring, Witnessed Apneas  Epworth Sleepiness Score: 14  SLEEP STUDY TECHNIQUE As per the AASM Manual for the Scoring of Sleep and Associated Events v2.3 (April 2016) with a hypopnea requiring 4% desaturations.  The channels recorded and monitored were frontal, central and occipital EEG, electrooculogram (EOG), submentalis EMG (chin), nasal and oral airflow, thoracic and abdominal wall motion, anterior tibialis EMG, snore microphone, electrocardiogram, and pulse oximetry.  MEDICATIONS Patient's medications include: N/A. Medications self-administered by patient during sleep study : No sleep medicine administered.  SLEEP ARCHITECTURE The study was initiated at 11:03:44 PM and ended at 5:14:33 AM.  Sleep onset time was 3.5 minutes and the sleep efficiency was 97.7%. The total sleep time was 362.3 minutes.  Stage REM latency was 88.0 minutes.  The patient spent 3.73% of the night in stage N1 sleep, 61.14% in stage N2 sleep, 13.25% in stage N3 and 21.88% in REM.  Alpha intrusion was absent.  Supine sleep was 70.86%.  RESPIRATORY PARAMETERS The overall apnea/hypopnea index (AHI) was 9.3 per hour. There were 2 total apneas, including 2 obstructive, 0 central and 0 mixed apneas. There were 54 hypopneas and 0 RERAs.  The AHI during Stage REM sleep was 18.9 per hour.  AHI while supine was 13.1 per hour.  The mean oxygen saturation was 92.81%. The minimum SpO2 during sleep was 0.00%.  Soft  snoring was noted during this study.  CARDIAC DATA The 2 lead EKG demonstrated sinus rhythm. The mean heart rate was 63.24 beats per minute. Other EKG findings include: PVCs. LEG MOVEMENT DATA The total PLMS were 164 with a resulting PLMS index of 27.16. Associated arousal with leg movement index was 1.2 .  IMPRESSIONS Mild mostly REM related obstructive sleep apnea not requiring positive pressure treatment. Moderate periodic limb movements of sleep occurred during the study. No significant associated arousals.    Delano Metz, MD Diplomate, American Board of Sleep Medicine.

## 2016-02-03 ENCOUNTER — Encounter (HOSPITAL_COMMUNITY): Payer: Self-pay

## 2016-02-03 ENCOUNTER — Emergency Department (HOSPITAL_COMMUNITY)
Admission: EM | Admit: 2016-02-03 | Discharge: 2016-02-03 | Disposition: A | Discharge status: Home / Self Care | Payer: Managed Care, Other (non HMO) | Attending: Emergency Medicine | Admitting: Emergency Medicine

## 2016-02-03 DIAGNOSIS — M1990 Unspecified osteoarthritis, unspecified site: Secondary | ICD-10-CM | POA: Diagnosis not present

## 2016-02-03 DIAGNOSIS — Z791 Long term (current) use of non-steroidal anti-inflammatories (NSAID): Secondary | ICD-10-CM | POA: Insufficient documentation

## 2016-02-03 DIAGNOSIS — R739 Hyperglycemia, unspecified: Secondary | ICD-10-CM

## 2016-02-03 DIAGNOSIS — Z79899 Other long term (current) drug therapy: Secondary | ICD-10-CM | POA: Diagnosis not present

## 2016-02-03 DIAGNOSIS — I10 Essential (primary) hypertension: Secondary | ICD-10-CM | POA: Diagnosis present

## 2016-02-03 DIAGNOSIS — R61 Generalized hyperhidrosis: Secondary | ICD-10-CM

## 2016-02-03 DIAGNOSIS — F17210 Nicotine dependence, cigarettes, uncomplicated: Secondary | ICD-10-CM | POA: Diagnosis not present

## 2016-02-03 DIAGNOSIS — Z7984 Long term (current) use of oral hypoglycemic drugs: Secondary | ICD-10-CM | POA: Insufficient documentation

## 2016-02-03 DIAGNOSIS — E1165 Type 2 diabetes mellitus with hyperglycemia: Secondary | ICD-10-CM | POA: Diagnosis not present

## 2016-02-03 LAB — CBC WITH DIFFERENTIAL/PLATELET
Basophils Absolute: 0.1 10*3/uL (ref 0.0–0.1)
Basophils Relative: 1 %
EOS ABS: 0.7 10*3/uL (ref 0.0–0.7)
EOS PCT: 6 %
HEMATOCRIT: 45.2 % (ref 39.0–52.0)
Hemoglobin: 15.7 g/dL (ref 13.0–17.0)
LYMPHS ABS: 4.1 10*3/uL — AB (ref 0.7–4.0)
Lymphocytes Relative: 37 %
MCH: 30.7 pg (ref 26.0–34.0)
MCHC: 34.7 g/dL (ref 30.0–36.0)
MCV: 88.3 fL (ref 78.0–100.0)
MONO ABS: 0.7 10*3/uL (ref 0.1–1.0)
MONOS PCT: 7 %
Neutro Abs: 5.4 10*3/uL (ref 1.7–7.7)
Neutrophils Relative %: 49 %
Platelets: 170 10*3/uL (ref 150–400)
RBC: 5.12 MIL/uL (ref 4.22–5.81)
RDW: 12.8 % (ref 11.5–15.5)
WBC: 11 10*3/uL — AB (ref 4.0–10.5)

## 2016-02-03 LAB — COMPREHENSIVE METABOLIC PANEL
ALBUMIN: 4.1 g/dL (ref 3.5–5.0)
ALT: 20 U/L (ref 17–63)
AST: 14 U/L — AB (ref 15–41)
Alkaline Phosphatase: 55 U/L (ref 38–126)
Anion gap: 9 (ref 5–15)
BUN: 12 mg/dL (ref 6–20)
CHLORIDE: 102 mmol/L (ref 101–111)
CO2: 22 mmol/L (ref 22–32)
CREATININE: 0.85 mg/dL (ref 0.61–1.24)
Calcium: 9 mg/dL (ref 8.9–10.3)
GFR calc Af Amer: >60 mL/min (ref >60)
GLUCOSE: 352 mg/dL — AB (ref 65–99)
Potassium: 4.2 mmol/L (ref 3.5–5.1)
Sodium: 133 mmol/L — ABNORMAL LOW (ref 135–145)
Total Bilirubin: 0.2 mg/dL — ABNORMAL LOW (ref 0.3–1.2)
Total Protein: 7.3 g/dL (ref 6.5–8.1)

## 2016-02-03 LAB — CBG MONITORING, ED
GLUCOSE-CAPILLARY: 240 mg/dL — AB (ref 65–99)
Glucose-Capillary: 401 mg/dL — ABNORMAL HIGH (ref 65–99)
Glucose-Capillary: 194 mg/dL — ABNORMAL HIGH (ref 65–99)

## 2016-02-03 LAB — TROPONIN I: Troponin I: <0.03 ng/mL (ref <0.031)

## 2016-02-03 LAB — CK: CK TOTAL: 113 U/L (ref 49–397)

## 2016-02-03 MED ORDER — SODIUM CHLORIDE 0.9 % IV BOLUS (SEPSIS)
1000.0000 mL | Freq: Once | INTRAVENOUS | Status: AC
  Administered 2016-02-03: 1000 mL via INTRAVENOUS

## 2016-02-03 NOTE — ED Notes (Signed)
Pt states his blood pressure at home this morning was high for him at 137/98 and his heart rate was 99.  Pt states this is not normal for him.  Pt states he just hasn't felt that good for the past day.

## 2016-02-03 NOTE — ED Provider Notes (Signed)
CSN: [id number]     Arrival date & time 02/03/16  [id number] History   First MD Initiated Contact with Patient 02/03/16 [phone]     Chief Complaint  Patient presents with  . Hypertension     (Consider location/radiation/quality/duration/timing/severity/associated sxs/prior Treatment) HPI patient states he works third shift. He did not work the night before but states when he woke up on the 28th he felt "groggy"even though he been up a couple hours. 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 He states at work tonight he could not do his normal activity. He states he got very diaphoretic. He states he continued to feel "groggy" which means sleepy. 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 He states the only symptom he had was that his eyes were itching and burning and they looked really red. He denies nausea, vomiting, diarrhea, shortness of breath, chest pain, abdominal pain, frequency, nocturia, feeling dizzy or lightheaded or feeling thirsty. He denies any pain. He states his blood sugars are usually 140-220. He states about a month ago he got a steroid injection in his back and his blood sugar at that time was 120. He checked his blood pressure at work and it was 168/99 with heart rate 110. He states this was his second steroid injection and it did not bother his blood sugar before. He does not check his blood sugar on a regular basis.  PCP Dr Anastasio Champion  Past Medical History  Diagnosis Date  . Hypertension   . High cholesterol   . Anginal pain ([place])     2010  . GERD (gastroesophageal reflux disease)     hx of  . Arthritis   . PONV (postoperative nausea and vomiting)   . Sleep apnea   . Diabetes mellitus without complication ([place])   . Myocardial infarct, old    Past Surgical History  Procedure Laterality Date  . Colon surgery    . Ankle reconstruction      lt reconstruction and rt scope sx  . Appendectomy  2000  . Wisdom tooth  extraction  2011  . Shoulder arthroscopy with subacromial decompression and open rotator c Right 02/25/2013    Procedure: RIGHT SHOULDER ARTHROSCOPY WITH SUBACROMIAL DECOMPRESSION AND MINI OPEN ROTATOR CUFF REPAIR;  Surgeon: Johnn Hai, MD;  Location: [facility];  Service: Orthopedics;  Laterality: Right;   Family History  Problem Relation Age of Onset  . Heart disease    . Arthritis    . Lung disease    . Cancer    . Asthma    . Diabetes     Social History  Substance Use Topics  . Smoking status: Current Every Day Smoker -- 1.00 packs/day for 19 years    Types: Cigarettes  . Smokeless tobacco: Former Systems developer    Types: Union date: 10/07/1993  . Alcohol Use: No  employed Lives at home  Review of Systems  All other systems reviewed and are negative.     Allergies  Albuterol  Home Medications   Prior to Admission medications   Medication Sig Start Date End Date Taking? Authorizing Provider  baclofen (LIORESAL) 10 MG tablet Take 10 mg by mouth 3 (three) times daily.   Yes Historical Provider, MD  lisinopril (PRINIVIL,ZESTRIL) 10 MG tablet Take 10 mg by mouth daily.   Yes Historical Provider, MD  metFORMIN (GLUCOPHAGE) 500 MG tablet Take by mouth 2 (two) times  daily with a meal.   Yes Historical Provider, MD  Vitamin D, Ergocalciferol, (DRISDOL) 50000 units CAPS capsule Take 50,000 Units by mouth every 7 (seven) days.   Yes Historical Provider, MD  ibuprofen (ADVIL,MOTRIN) 200 MG tablet Take 800 mg by mouth every 6 (six) hours as needed for mild pain or moderate pain.    Historical Provider, MD   BP 151/92 mmHg  Pulse 87  Temp(Src) 97.9 F (36.6 C) (Oral)  Resp 20  Ht 5\' 11"  (1.803 m)  Wt 200 lb (90.719 kg)  BMI 27.91 kg/m2  SpO2 98%  Vital signs normal mild hypertension  Physical Exam  Constitutional: He is oriented to person, place, and time. He appears well-developed and well-nourished.  Non-toxic appearance. He does not appear ill. No distress.  HENT:   Head: Normocephalic and atraumatic.  Right Ear: External ear normal.  Left Ear: External ear normal.  Nose: Nose normal. No mucosal edema or rhinorrhea.  Mouth/Throat: Oropharynx is clear and moist and mucous membranes are normal. No dental abscesses or uvula swelling.  Eyes: Conjunctivae and EOM are normal. Pupils are equal, round, and reactive to light.  Neck: Normal range of motion and full passive range of motion without pain. Neck supple.  Cardiovascular: Normal rate, regular rhythm and normal heart sounds.  Exam reveals no gallop and no friction rub.   No murmur heard. Pulmonary/Chest: Effort normal and breath sounds normal. No respiratory distress. He has no wheezes. He has no rhonchi. He has no rales. He exhibits no tenderness and no crepitus.  Abdominal: Soft. Normal appearance and bowel sounds are normal. He exhibits no distension. There is no tenderness. There is no rebound and no guarding.  Musculoskeletal: Normal range of motion. He exhibits no edema or tenderness.  Moves all extremities well.   Neurological: He is alert and oriented to person, place, and time. He has normal strength. No cranial nerve deficit.  Skin: Skin is warm, dry and intact. No rash noted. No erythema. No pallor.  Psychiatric: He has a normal mood and affect. His speech is normal and behavior is normal. His mood appears not anxious.  Nursing note and vitals reviewed.   ED Course  Procedures (including critical care time)  Medications  sodium chloride 0.9 % bolus 1,000 mL (not administered)  sodium chloride 0.9 % bolus 1,000 mL (0 mLs Intravenous Stopped 02/03/16 0732)   Patient's initial CBG was 401. IV was started he was given IV fluids. EKG was done.  Recheck at 7:20 AM agent states he isn't sweating anymore however he is feeling well, states he still feels "groggy". We discussed his lab results and his EKG. I am planning on a delta troponin, will add a CK b/o the sweating at work.  He is having  urinary output after the first liter of IV fluids.   07:45 AM pt turned over to Dr Jacinto Reap Sabra Heck at change of shift to get second troponin.    Labs Review Results for orders placed or performed during the hospital encounter of 02/03/16  Comprehensive metabolic panel  Result Value Ref Range   Sodium 133 (L) 135 - 145 mmol/L   Potassium 4.2 3.5 - 5.1 mmol/L   Chloride 102 101 - 111 mmol/L   CO2 22 22 - 32 mmol/L   Glucose, Bld 352 (H) 65 - 99 mg/dL   BUN 12 6 - 20 mg/dL   Creatinine, Ser 0.85 0.61 - 1.24 mg/dL   Calcium 9.0 8.9 - 10.3 mg/dL   Total  Protein 7.3 6.5 - 8.1 g/dL   Albumin 4.1 3.5 - 5.0 g/dL   AST 14 (L) 15 - 41 U/L   ALT 20 17 - 63 U/L   Alkaline Phosphatase 55 38 - 126 U/L   Total Bilirubin 0.2 (L) 0.3 - 1.2 mg/dL   GFR calc non Af Amer >60 >60 mL/min   GFR calc Af Amer >60 >60 mL/min   Anion gap 9 5 - 15  CBC with Differential  Result Value Ref Range   WBC 11.0 (H) 4.0 - 10.5 K/uL   RBC 5.12 4.22 - 5.81 MIL/uL   Hemoglobin 15.7 13.0 - 17.0 g/dL   HCT 45.2 39.0 - 52.0 %   MCV 88.3 78.0 - 100.0 fL   MCH 30.7 26.0 - 34.0 pg   MCHC 34.7 30.0 - 36.0 g/dL   RDW 12.8 11.5 - 15.5 %   Platelets 170 150 - 400 K/uL   Neutrophils Relative % 49 %   Neutro Abs 5.4 1.7 - 7.7 K/uL   Lymphocytes Relative 37 %   Lymphs Abs 4.1 (H) 0.7 - 4.0 K/uL   Monocytes Relative 7 %   Monocytes Absolute 0.7 0.1 - 1.0 K/uL   Eosinophils Relative 6 %   Eosinophils Absolute 0.7 0.0 - 0.7 K/uL   Basophils Relative 1 %   Basophils Absolute 0.1 0.0 - 0.1 K/uL  Troponin I  Result Value Ref Range   Troponin I <0.03 <0.031 ng/mL   Laboratory interpretation all normal except leukocytosis, hyperglycemia without metabolic acidosis and negative first troponin    EKG Interpretation   Date/Time:  Saturday February 03 2016 06:07:19 EDT Ventricular Rate:  86 PR Interval:  166 QRS Duration: 89 QT Interval:  349 QTC Calculation: 417 R Axis:   -13 Text Interpretation:  Sinus rhythm Inferior  infarct, old Anterior infarct,  old Lateral leads are also involved Since last tracing 06 Jun 2015 ST  elevation in Lateral leads Confirmed by Helon Wisinski  MD-I, Valoria Tamburri ([id number]) on  02/03/2016 6:12:49 AM     EKG Interpretation  Date/Time:  Saturday February 03 2016 06:14:13 EDT Ventricular Rate:  80 PR Interval:  161 QRS Duration: 84 QT Interval:  356 QTC Calculation: 411 R Axis:   -7 Text Interpretation:  Sinus rhythm ST elev, probable normal early repol pattern ST elevation in Lateral leads is now more prevelant than  06 Jun 2015 Confirmed by Conor Lata  MD-I, Freeda Spivey ([id number]) on 02/03/2016 6:18:52 AM          MDM   Final diagnoses:  Diaphoresis  Hyperglycemia   Disposition pending   Rolland Porter, MD, Barbette Or, MD 02/03/16 [phone]

## 2016-02-03 NOTE — Discharge Instructions (Signed)
Your testing has been unremarkable other than your blood sugar,   Take your medicines as prescribed Drink plenty of fluids today  Seek medical attention if you are not improving or if symptoms worsen.

## 2016-02-03 NOTE — ED Provider Notes (Signed)
The pt has had no sx on my watch He feels good He is tired and wants to go to sleep His CBG continues to improve 2 X neg trop Pt has no CP, SOB and no more sweating I have looked at the ECG and there is essentially no changes Pt informed of findings and need for f/u - agreeable.  Noemi Chapel, MD 02/03/16 [phone]

## 2016-02-03 NOTE — ED Notes (Signed)
CBG  401 

## 2016-06-13 ENCOUNTER — Ambulatory Visit (INDEPENDENT_AMBULATORY_CARE_PROVIDER_SITE_OTHER): Payer: Managed Care, Other (non HMO) | Admitting: Cardiology

## 2016-06-13 ENCOUNTER — Encounter: Payer: Self-pay | Admitting: Cardiology

## 2016-06-13 VITALS — BP 120/76 | HR 84 | Ht 71.0 in | Wt 203.0 lb

## 2016-06-13 DIAGNOSIS — Z8249 Family history of ischemic heart disease and other diseases of the circulatory system: Secondary | ICD-10-CM | POA: Diagnosis not present

## 2016-06-13 DIAGNOSIS — R0789 Other chest pain: Secondary | ICD-10-CM | POA: Diagnosis not present

## 2016-06-13 DIAGNOSIS — R9431 Abnormal electrocardiogram [ECG] [EKG]: Secondary | ICD-10-CM

## 2016-06-13 DIAGNOSIS — E1165 Type 2 diabetes mellitus with hyperglycemia: Secondary | ICD-10-CM

## 2016-06-13 DIAGNOSIS — IMO0001 Reserved for inherently not codable concepts without codable children: Secondary | ICD-10-CM

## 2016-06-13 DIAGNOSIS — I10 Essential (primary) hypertension: Secondary | ICD-10-CM | POA: Diagnosis not present

## 2016-06-13 DIAGNOSIS — Z720 Tobacco use: Secondary | ICD-10-CM

## 2016-06-13 NOTE — Patient Instructions (Signed)
Your physician recommends that you schedule a follow-up appointment in: to be determined after tests   Your physician recommends that you continue on your current medications as directed. Please refer to the Current Medication list given to you today.    Your physician has requested that you have a lexiscan myoview. For further information please visit [email or web address]. Please follow instruction sheet, as given.       Thank you for choosing [facility] !

## 2016-06-13 NOTE — Progress Notes (Signed)
Cardiology Office Note  Date: 06/13/2016   ID: LEXX ETZEL, DOB 1971/05/15, MRN [id number]  PCP: Doree Albee, MD  Consulting Cardiologist: Rozann Lesches, MD   Chief Complaint  Patient presents with  . Abnormal ECG    History of Present Illness: Mark Phillips is a 45 y.o. male referred for cardiology consultation by Dr. Anastasio Champion. He is referred for further evaluation in light of abnormal ECG. I reviewed his history which includes hypertension and diabetes mellitus. He also has family history of premature CAD on his mother's side of the family. 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 States that his job is very physical. He does not endorse any reproducible exertional chest pain, states that he sweats a lot, also has a vague sense of "emptiness" in his chest sometimes. No dizziness or syncope. He just recently had lumbar disc surgery with Dr. Carloyn Manner at [facility]. Still has lifting and straining restrictions.  He reports having undergone a stress test back in 2010 when he was seeing Dr. Manuella Ghazi in [place]. Does not recall any abnormalities. Also recently had an echocardiogram at [facility].  He just had a recent battery of lab work sent, results are pending. I did review his ECGs as outlined below.  Past Medical History:  Diagnosis Date  . Arthritis   . Essential hypertension   . GERD (gastroesophageal reflux disease)   . Hyperlipidemia   . 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 Sleep apnea   . Type 2 diabetes mellitus ([place])     Past Surgical History:  Procedure Laterality Date  . ANKLE RECONSTRUCTION     lt reconstruction and rt scope sx  . APPENDECTOMY  2000  . COLON SURGERY    . LUMBAR DISC SURGERY     July 2017 - Dr. Carloyn Manner  . SHOULDER ARTHROSCOPY WITH SUBACROMIAL DECOMPRESSION AND OPEN ROTATOR C Right 02/25/2013   Procedure: RIGHT SHOULDER ARTHROSCOPY WITH SUBACROMIAL DECOMPRESSION AND MINI OPEN ROTATOR CUFF REPAIR;  Surgeon: Johnn Hai, MD;   Location: [facility];  Service: Orthopedics;  Laterality: Right;  . WISDOM TOOTH EXTRACTION  2011    Current Outpatient Prescriptions  Medication Sig Dispense Refill  . baclofen (LIORESAL) 10 MG tablet Take 10 mg by mouth 3 (three) times daily.    . Cholecalciferol (VITAMIN D3) 5000 units TABS Take 1 tablet by mouth daily.    [name] GLIPIZIDE XL 5 MG 24 hr tablet     . lisinopril (PRINIVIL,ZESTRIL) 10 MG tablet Take 10 mg by mouth daily.    . metFORMIN (GLUCOPHAGE) 500 MG tablet Take by mouth 2 (two) times daily with a meal.    . oxyCODONE-acetaminophen (PERCOCET/ROXICET) 5-325 MG tablet      No current facility-administered medications for this visit.    Allergies:  Albuterol   Social History: The patient  reports that he has been smoking Cigarettes.  He started smoking about 23 years ago. He has a 19.00 pack-year smoking history. He quit smokeless tobacco use about 22 years ago. His smokeless tobacco use included Chew. He reports that he does not drink alcohol or use drugs.   Family History: The patient's family history includes COPD in his father; Diabetes Mellitus II in his mother; Heart attack in his mother; Hypertension in his mother.   ROS:  Please see the history of present illness. Otherwise, complete review of systems is positive for improving back discomfort.  All other systems are reviewed and negative.  Physical Exam: VS:  BP 120/76 (BP Location: Right Arm)   Pulse 84   Ht 5\' 11"  (1.803 m)   Wt 203 lb (92.1 kg)   SpO2 97%   BMI 28.31 kg/m , BMI Body mass index is 28.31 kg/m.  Wt Readings from Last 3 Encounters:  06/13/16 203 lb (92.1 kg)  02/03/16 200 lb (90.7 kg)  07/17/15 195 lb (88.5 kg)    General: Patient appears comfortable at rest. HEENT: Conjunctiva and lids normal, oropharynx clear. Neck: Supple, no elevated JVP or carotid bruits, no thyromegaly. Lungs: Clear to auscultation, nonlabored breathing at rest. Cardiac: Regular rate and rhythm, no S3 or significant  systolic murmur, no pericardial rub. Abdomen: Soft, nontender, bowel sounds present, no guarding or rebound. Extremities: No pitting edema, distal pulses 2+. Skin: Warm and dry. Musculoskeletal: No kyphosis. Neuropsychiatric: Alert and oriented x3, affect grossly appropriate.  ECG: I personally reviewed the tracing from 03/15/2016 which showed normal sinus rhythm with poor R-wave progression, Q waves leads III and aVF. Similar inferior changes noted compared to prior study from August 2016, however R-wave progression has decreased.  Recent Labwork: 02/03/2016: ALT 20; AST 14; BUN 12; Creatinine, Ser 0.85; Hemoglobin 15.7; Platelets 170; Potassium 4.2; Sodium 133   Other Studies Reviewed Today:  Echocardiogram 06/01/2015 (outside study): Moderate LVH with normal systolic function (LVEF not reported), normal diastolic function, trace mitral regurgitation.  Assessment and Plan:  1. Abnormal ECG. Inferior Q waves are old compared to tracing from last year, although at that time he had an echocardiogram that did not demonstrate any focal wall motion abnormalities. This could potentially be a normal variant. He does have poor R wave progression at this time. We are going to request the results of the echocardiogram done recently at [facility]. Also proceed with this ischemic testing in the form of a Lexiscan Myoview (avoiding exercise testing with recent back surgery). He does have significant cardiac risk factors including family history of premature CAD, hypertension, tobacco abuse and type 2 diabetes mellitus.  2. Essential hypertension, blood pressure is adequately controlled today.  3. Type 2 diabetes mellitus, on Glucophage and glipizide XL. He follows with Dr. Anastasio Champion. Recent follow-up lab work is pending.  4. Tobacco abuse. Smoking cessation will be important in terms of reducing his modifiable risks.  Current medicines were reviewed with the patient today.   Orders Placed This Encounter    Procedures  . NM Myocar Multi W/Spect W/Wall Motion / EF    Disposition: Call with results.  Signed, Satira Sark, MD, [facility] 06/13/2016 1:54 PM    [place] [facility] [address] [place], [place] [address] Phone: [phone]; Fax: [phone]

## 2016-06-21 ENCOUNTER — Inpatient Hospital Stay (HOSPITAL_COMMUNITY): Admission: RE | Admit: 2016-06-21 | Payer: Managed Care, Other (non HMO) | Source: Ambulatory Visit

## 2016-06-21 ENCOUNTER — Encounter (HOSPITAL_COMMUNITY)
Admission: RE | Admit: 2016-06-21 | Discharge: 2016-06-21 | Disposition: A | Discharge status: Home / Self Care | Payer: Managed Care, Other (non HMO) | Source: Ambulatory Visit | Attending: Cardiology | Admitting: Cardiology

## 2016-06-21 ENCOUNTER — Encounter (HOSPITAL_COMMUNITY): Payer: Self-pay

## 2016-06-21 DIAGNOSIS — R0789 Other chest pain: Secondary | ICD-10-CM | POA: Insufficient documentation

## 2016-06-21 DIAGNOSIS — R9431 Abnormal electrocardiogram [ECG] [EKG]: Secondary | ICD-10-CM

## 2016-06-21 LAB — NM MYOCAR MULTI W/SPECT W/WALL MOTION / EF
CHL CUP NUCLEAR SDS: 0
CHL CUP RESTING HR STRESS: 61 {beats}/min
CSEPPHR: 90 {beats}/min
LVDIAVOL: 124 mL (ref 62–150)
LVSYSVOL: 55 mL
RATE: 0.3
SRS: 0
SSS: 0
TID: 1.05

## 2016-06-21 MED ORDER — TECHNETIUM TC 99M TETROFOSMIN IV KIT
30.0000 | PACK | Freq: Once | INTRAVENOUS | Status: AC | PRN
  Administered 2016-06-21: 30 via INTRAVENOUS

## 2016-06-21 MED ORDER — SODIUM CHLORIDE 0.9% FLUSH
INTRAVENOUS | Status: AC
  Administered 2016-06-21: 10 mL via INTRAVENOUS
  Filled 2016-06-21: qty 10

## 2016-06-21 MED ORDER — REGADENOSON 0.4 MG/5ML IV SOLN
INTRAVENOUS | Status: AC
  Administered 2016-06-21: 0.4 mg via INTRAVENOUS
  Filled 2016-06-21: qty 5

## 2016-06-21 MED ORDER — TECHNETIUM TC 99M TETROFOSMIN IV KIT
10.0000 | PACK | Freq: Once | INTRAVENOUS | Status: AC | PRN
  Administered 2016-06-21: 11 via INTRAVENOUS

## 2016-07-04 ENCOUNTER — Ambulatory Visit (INDEPENDENT_AMBULATORY_CARE_PROVIDER_SITE_OTHER): Payer: Managed Care, Other (non HMO) | Admitting: Pulmonary Disease

## 2016-07-04 ENCOUNTER — Encounter: Payer: Self-pay | Admitting: Pulmonary Disease

## 2016-07-04 VITALS — BP 122/78 | HR 89 | Ht 71.0 in | Wt 206.0 lb

## 2016-07-04 DIAGNOSIS — G4733 Obstructive sleep apnea (adult) (pediatric): Secondary | ICD-10-CM | POA: Diagnosis not present

## 2016-07-04 DIAGNOSIS — Z720 Tobacco use: Secondary | ICD-10-CM

## 2016-07-04 NOTE — Patient Instructions (Signed)
  It was a pleasure taking care of you today!  You are diagnosed with Obstructive Sleep Apnea or OSA.  You stop breathing  9  times an hour.   We will order you an autoCPAP  machine.  Please call the office if you do NOT receive your machine in the next 1-2 weeks.   Please make sure you use your CPAP device everytime you sleep.  We will monitor the usage of your machine per your insurance requirement.  Your insurance company may take the machine from you if you are not using it regularly.   Please clean the mask, tubings, filter, water reservoir with soapy water every week.  Please use distilled water for the water reservoir.   Please call the office or your machine provider (DME company) if you are having issues with the device.   Return to clinic in 6-8 weeks with Dr. Corrie Dandy or NP.

## 2016-07-04 NOTE — Progress Notes (Signed)
Subjective:    Patient ID: Mark Phillips, male    DOB: June 16, 1971, 45 y.o.   MRN: [id number]  HPI   This is the case of Mark Phillips, 45 y.o. Male, who was referred by Dr. Saddie Benders  in consultation regarding possible OSA   As you very well know, patient has a 20PY smoking history, not been diagnosed with asthma and COPD.   Patient complains of snoring, gasping, choking, witnessed apneas, unrefreshed sleep. Has hypersomnia affecting fxnality.   Patient was diagnosed with OSA in 2004. Severe allegedly.  He was given a cpap machine set at 7 cm water. He felt better with cpap. Machine was replaced after repeating study 5 yrs after.  2nd machine was set at 11 cm water. He is not sure if he ended up paying for the 2nd machine out of pocket.  The 2nd machine broke 1.5 yrs ago. He had a rpt study in 07/2016 which showed mild OSA with AHI of 9. Allegedly, per the doctor who read the study, he did not need a CPAP machine.  His symptoms were worse off CPAP machine. Patient ended up getting a loaner cpap machine from a friend. He started using it for a year. He feels better using cpap machine. Less sleepiness, hypersomnia.   Pt also with recent back surgery in July 2017. Still on medical leave.    Review of Systems  Constitutional: Negative.  Negative for fever and unexpected weight change.  HENT: Positive for congestion and rhinorrhea. Negative for dental problem, ear pain, nosebleeds, postnasal drip, sinus pressure, sneezing, sore throat and trouble swallowing.   Eyes: Negative.  Negative for redness and itching.  Respiratory: Positive for cough. Negative for chest tightness, shortness of breath and wheezing.   Cardiovascular: Negative.  Negative for palpitations and leg swelling.  Gastrointestinal: Negative.  Negative for nausea and vomiting.  Endocrine: Negative.   Genitourinary: Negative.  Negative for dysuria.  Musculoskeletal: Positive for arthralgias, back pain and neck pain.  Negative for joint swelling.  Skin: Negative.  Negative for rash.  Allergic/Immunologic: Positive for environmental allergies.  Neurological: Negative.  Negative for headaches.  Hematological: Negative.  Does not bruise/bleed easily.  Psychiatric/Behavioral: Negative.  Negative for dysphoric mood. The patient is not nervous/anxious.    Past Medical History:  Diagnosis Date  . Arthritis   . Essential hypertension   . GERD (gastroesophageal reflux disease)   . Hyperlipidemia   . 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 Sleep apnea   . Type 2 diabetes mellitus (HCC)    (-) CA, DVT  Family History  Problem Relation Age of Onset  . Heart attack Mother   . Hypertension Mother   . Diabetes Mellitus II Mother   . COPD Father      Past Surgical History:  Procedure Laterality Date  . ANKLE RECONSTRUCTION     lt reconstruction and rt scope sx  . APPENDECTOMY  2000  . COLON SURGERY    . LUMBAR DISC SURGERY     July 2017 - Dr. Carloyn Manner  . SHOULDER ARTHROSCOPY WITH SUBACROMIAL DECOMPRESSION AND OPEN ROTATOR C Right 02/25/2013   Procedure: RIGHT SHOULDER ARTHROSCOPY WITH SUBACROMIAL DECOMPRESSION AND MINI OPEN ROTATOR CUFF REPAIR;  Surgeon: Johnn Hai, MD;  Location: [facility];  Service: Orthopedics;  Laterality: Right;  . WISDOM TOOTH EXTRACTION  2011    Social History   Social History  . Marital status: Married    Spouse name: N/A  .  Number of children: N/A  . Years of education: N/A   Occupational History  . Not on file.   Social History Main Topics  . Smoking status: Current Every Day Smoker    Packs/day: 1.00    Years: 19.00    Types: Cigarettes    Start date: 06/13/1993  . Smokeless tobacco: Former Systems developer    Types: Chew    Quit date: 10/07/1993  . Alcohol use No  . Drug use: No  . Sexual activity: Not on file   Other Topics Concern  . Not on file   Social History Narrative  . 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 (-) ETOH. Lives in  [place].   Allergies  Allergen Reactions  . Albuterol Swelling     Outpatient Medications Prior to Visit  Medication Sig Dispense Refill  . baclofen (LIORESAL) 10 MG tablet Take 10 mg by mouth 3 (three) times daily.    . Cholecalciferol (VITAMIN D3) 5000 units TABS Take 1 tablet by mouth daily.    [name] GLIPIZIDE XL 5 MG 24 hr tablet     . lisinopril (PRINIVIL,ZESTRIL) 10 MG tablet Take 10 mg by mouth daily.    . metFORMIN (GLUCOPHAGE) 500 MG tablet Take by mouth 2 (two) times daily with a meal.    . oxyCODONE-acetaminophen (PERCOCET/ROXICET) 5-325 MG tablet      No facility-administered medications prior to visit.    No orders of the defined types were placed in this encounter.       Objective:   Physical Exam Vitals:  Vitals:   07/04/16 1341  BP: 122/78  Pulse: 89  SpO2: 96%  Weight: 206 lb (93.4 kg)  Height: 5\' 11"  (1.803 m)    Constitutional/General:  Pleasant, well-nourished, well-developed, not in any distress,  Comfortably seating.  Well kempt  Body mass index is 28.73 kg/m. Wt Readings from Last 3 Encounters:  07/04/16 206 lb (93.4 kg)  06/13/16 203 lb (92.1 kg)  02/03/16 200 lb (90.7 kg)      HEENT: Pupils equal and reactive to light and accommodation. Anicteric sclerae. Normal nasal mucosa.   No oral  lesions,  mouth clear,  oropharynx clear, no postnasal drip. (-) Oral thrush. No dental caries.  Airway - Mallampati class III  Neck: No masses. Midline trachea. No JVD, (-) LAD. (-) bruits appreciated.  Respiratory/Chest: Grossly normal chest. (-) deformity. (-) Accessory muscle use.  Symmetric expansion. (-) Tenderness on palpation.  Resonant on percussion.  Diminished BS on both lower lung zones. (-) wheezing, crackles, rhonchi (-) egophony  Cardiovascular: Regular rate and  rhythm, heart sounds normal, no murmur or gallops, no peripheral edema  Gastrointestinal:  Normal bowel sounds. Soft, non-tender. No hepatosplenomegaly.  (-) masses.    Musculoskeletal:  Normal muscle tone. Normal gait.   Extremities: Grossly normal. (-) clubbing, cyanosis.  (-) edema  Skin: (-) rash,lesions seen.   Neurological/Psychiatric : alert, oriented to time, place, person. Normal mood and affect           Assessment & Plan:  OSA (obstructive sleep apnea) Patient complains of snoring, gasping, choking, witnessed apneas, unrefreshed sleep. Has hypersomnia affecting fxnality.   Patient was diagnosed with OSA in 2004. Severe allegedly.  He was given a cpap machine set at 7 cm water. He felt better with cpap. Machine was replaced after repeating study 5 yrs after.  2nd machine was set at 11 cm water. He is not sure if he ended up  paying for the 2nd machine out of pocket.  The 2nd machine broke 1.5 yrs ago. He had a rpt study in 07/2016 which showed mild OSA with AHI of 9. Allegedly, per the doctor who read the study, he did not need a CPAP machine.  His symptoms were worse off CPAP machine. Patient ended up getting a loaner cpap machine from a friend. He started using it for a year. He feels better using cpap machine. Less sleepiness, hypersomnia.  Plan :  We extensively discussed the diagnosis, pathophysiology, and treatment options for Obstructive Sleep Apnea (OSA).   We discussed treatment options for OSA including CPAP, BiPaP, as well as surgical options and oral devices.   We will start patient on autocpap 5-15 cm water. He had a study in 2016 and hopefully insurance will cover a new machine. He is currently using a friend's machine. His second machine broke a 1.5 yrs ago and he has not ordered a new machine since that time.  Patient was instructed to call the office if he/she has not received the PAP device in 1-2 weeks.  Patient was instructed to have mask, tubings, filter, reservoir cleaned at least once a week with soapy water.  Patient was instructed to call the office if he/she is having issues with the PAP device.    I  advised patient to obtain sufficient amount of sleep --  7 to 8 hours at least in a 24 hr period.  Patient was advised to follow good sleep hygiene.  Patient was advised NOT to engage in activities requiring concentration and/or vigilance if he/she is and  sleepy.  Patient is NOT to drive if he/she is sleepy.       Tobacco user Smoking cessation.  Got flu shot.    Thank you very much for letting me participate in this patient's care. Please do not hesitate to give me a call if you have any questions or concerns regarding the treatment plan.   Patient will follow up with me in 6-8 weeks    J. Shirl Harris, MD 07/04/2016   2:17 PM Pulmonary and [place] Pager: [phone] Office: [phone], Fax: [phone]

## 2016-07-04 NOTE — Assessment & Plan Note (Signed)
Patient complains of snoring, gasping, choking, witnessed apneas, unrefreshed sleep. Has hypersomnia affecting fxnality.   Patient was diagnosed with OSA in 2004. Severe allegedly.  He was given a cpap machine set at 7 cm water. He felt better with cpap. Machine was replaced after repeating study 5 yrs after.  2nd machine was set at 11 cm water. He is not sure if he ended up paying for the 2nd machine out of pocket.  The 2nd machine broke 1.5 yrs ago. He had a rpt study in 07/2016 which showed mild OSA with AHI of 9. Allegedly, per the doctor who read the study, he did not need a CPAP machine.  His symptoms were worse off CPAP machine. Patient ended up getting a loaner cpap machine from a friend. He started using it for a year. He feels better using cpap machine. Less sleepiness, hypersomnia.  Plan :  We extensively discussed the diagnosis, pathophysiology, and treatment options for Obstructive Sleep Apnea (OSA).   We discussed treatment options for OSA including CPAP, BiPaP, as well as surgical options and oral devices.   We will start patient on autocpap 5-15 cm water. He had a study in 2016 and hopefully insurance will cover a new machine. He is currently using a friend's machine. His second machine broke a 1.5 yrs ago and he has not ordered a new machine since that time.  Patient was instructed to call the office if he/she has not received the PAP device in 1-2 weeks.  Patient was instructed to have mask, tubings, filter, reservoir cleaned at least once a week with soapy water.  Patient was instructed to call the office if he/she is having issues with the PAP device.    I advised patient to obtain sufficient amount of sleep --  7 to 8 hours at least in a 24 hr period.  Patient was advised to follow good sleep hygiene.  Patient was advised NOT to engage in activities requiring concentration and/or vigilance if he/she is and  sleepy.  Patient is NOT to drive if he/she is sleepy.

## 2016-07-04 NOTE — Assessment & Plan Note (Signed)
Smoking cessation.  Got flu shot.

## 2016-07-10 ENCOUNTER — Ambulatory Visit (HOSPITAL_COMMUNITY): Payer: Managed Care, Other (non HMO) | Attending: Neurosurgery | Admitting: Physical Therapy

## 2016-07-10 DIAGNOSIS — M6281 Muscle weakness (generalized): Secondary | ICD-10-CM | POA: Diagnosis present

## 2016-07-10 DIAGNOSIS — G8929 Other chronic pain: Secondary | ICD-10-CM | POA: Diagnosis present

## 2016-07-10 DIAGNOSIS — M545 Low back pain: Secondary | ICD-10-CM | POA: Insufficient documentation

## 2016-07-10 DIAGNOSIS — R29898 Other symptoms and signs involving the musculoskeletal system: Secondary | ICD-10-CM | POA: Diagnosis present

## 2016-07-10 NOTE — Therapy (Signed)
[facility] [address] [place], [place], [address] Phone: [phone]   Fax:  [phone]  Physical Therapy Evaluation  Patient Details  Name: Mark Phillips MRN: [id number] Date of Birth: 1971-03-13 Referring Provider: Glenna Fellows   Encounter Date: 07/10/2016      PT End of Session - 07/10/16 1210    Visit Number 1   Number of Visits 8   Date for PT Re-Evaluation 08/07/16   Authorization Type [employer] Managed    Authorization Time Period 07/10/16 to 08/07/16   PT Start Time 1122  patient arrived late/had to do intake paperwork   PT Stop Time 1158   PT Time Calculation (min) 36 min   Activity Tolerance Patient tolerated treatment well   Behavior During Therapy [facility] for tasks assessed/performed      Past Medical History:  Diagnosis Date  . Arthritis   . Essential hypertension   . GERD (gastroesophageal reflux disease)   . Hyperlipidemia   . 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 Sleep apnea   . Type 2 diabetes mellitus ([place])     Past Surgical History:  Procedure Laterality Date  . ANKLE RECONSTRUCTION     lt reconstruction and rt scope sx  . APPENDECTOMY  2000  . COLON SURGERY    . LUMBAR DISC SURGERY     July 2017 - Dr. Carloyn Manner  . SHOULDER ARTHROSCOPY WITH SUBACROMIAL DECOMPRESSION AND OPEN ROTATOR C Right 02/25/2013   Procedure: RIGHT SHOULDER ARTHROSCOPY WITH SUBACROMIAL DECOMPRESSION AND MINI OPEN ROTATOR CUFF REPAIR;  Surgeon: Johnn Hai, MD;  Location: [facility];  Service: Orthopedics;  Laterality: Right;  . WISDOM TOOTH EXTRACTION  2011    There were no vitals filed for this visit.       Subjective Assessment - 07/10/16 1126    Subjective Patient reports that his back has been hurting for years, he is not aware of any significant injury; he had extensive lumbar surgey in July of 2016. He has been feeling well since surgery, he has not really done any aggressive tasks since surgery, has not done a lot around  the house. He does have quite a bit of tension in the muscles of his back. He has a hard time being bent over for any length of time, sitting still also hurts; he is still getting pains down to his knee and his hips also hurt. He rates himself as being 30/100; he states that one of his main concerns is feeling weak and trying to do too much too quick.    Pertinent History OSA, HTN, DM, history lumbar surgery July 2017: L5-S1 laminectomy/discectomy, R L3-L4 laminotomy and foraminontomy   How long can you sit comfortably? 5 minutes then needs to shift and lean to the L; can drive around 60 minutes however    How long can you stand comfortably? unlimited    How long can you walk comfortably? unlimited    Patient Stated Goals reduce pain, get stronger, get back to work/PLOF    Currently in Pain? No/denies            [facility] PT Assessment - 07/10/16 0001      Assessment   Medical Diagnosis back pain post surgery    Referring Provider Glenna Fellows    Onset Date/Surgical Date --  July 2017   Next MD Visit Dr. Carloyn Manner 10/18     Precautions   Precaution Comments cannot lift more than a  gallon of milk; can bend and lift as tolerated but with caution (per patient)     Balance Screen   Has the patient fallen in the past 6 months No   Has the patient had a decrease in activity level because of a fear of falling?  No   Is the patient reluctant to leave their home because of a fear of falling?  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      Observation/Other Assessments   Focus on Therapeutic Outcomes (FOTO)  50% limited      Posture/Postural Control   Posture Comments WNL      Strength   Right Hip Flexion 5/5   Right Hip ABduction 4+/5   Left Hip Flexion  5/5   Left Hip ABduction 4/5   Right Knee Flexion 4/5   Right Knee Extension 4+/5   Left Knee Flexion 4/5   Left Knee Extension 4+/5   Right Ankle Dorsiflexion 4+/5   Left Ankle Dorsiflexion 5/5     Palpation   Palpation comment knotting noted on bilateral sides of lower lumbar spine      Ambulation/Gait   Gait Comments WFL, no significatn deviations      6 minute walk test results    Aerobic Endurance Distance Walked 653   Endurance additional comments 3MWT      High Level Balance   High Level Balance Comments SLS 30 seconds B, stopped by PT                            PT Education - 07/10/16 1209    Education provided Yes   Education Details prognosis, POC, HEP    Person(s) Educated Patient   Methods Explanation;Demonstration;Handout   Comprehension Verbalized understanding;Returned demonstration;Need further instruction          PT Short Term Goals - 07/10/16 1218      PT SHORT TERM GOAL #1   Title Patient to demonstrate at least a 50% reduction in lumbar muscle knotting in order to reduce pain and improve functional mechanics    Time 2   Period Weeks   Status New     PT SHORT TERM GOAL #2   Title Patient to experience pain no more than 1/10 with all functional movements in order to show improvement of condition and assist in progression of return to PLOF    Time 2   Period Weeks   Status New     PT SHORT TERM GOAL #3   Title Patient to be independent in correctly and consistently perform appropriate HEP, to be updated PRN    Time 1   Period Weeks   Status New           PT Long Term Goals - 07/10/16 1221      PT LONG TERM GOAL #1   Title Patient to demonstrate functional LE and core strength as being 5/5 in order to assist in reducing pain and returning to PLOF based activities    Time 4   Period Weeks   Status New     PT LONG TERM GOAL #2   Title Patient to demonstrate correct functional lifting techniques with various sizes  of boxes/loads in order to ensure safety  and facilitate return to work with pain no more than 1/10 in order to assist in return to PLOF    Time 4   Period Weeks   Status New     PT LONG TERM GOAL #3   Title Patient to score no more than 20% limitation on FOTO survey in order to show general improvement of condition    Time 4   Period Weeks   Status New               Plan - 07/10/16 1213    Clinical Impression Statement Patient arrives today status post lumbar surgery (see PMH/subjective for details), which was performed in July 2017; the patient states that he is doing fairly well after surgery, although he does have some pain in his knee still, it is not as bad as it was prior to surgery. Upon examination, patient reveals mild functional weakness, however demonstrates satisfactory balance, posture, and basic functional lifting mechanics today. 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 At this time recommend skilled PT services to primarily focus on functional strengthening, core training and strengthening,  functional lifting mechanics with various size objects and weight progressed as MD allows, and to develop an appropriate HEP for use/maintenance following DC.    Rehab Potential Good   PT Frequency 2x / week   PT Duration 4 weeks   PT Treatment/Interventions ADLs/Self Care Home Management;Biofeedback;Cryotherapy;Moist Heat;Stair training;Functional mobility training;Therapeutic activities;Therapeutic exercise;Balance training;Neuromuscular re-education;Patient/family education;Manual techniques;Scar mobilization;Energy conservation;Taping   PT Next Visit Plan review HEP/goals; focus on functional LE and core strengthening, lifting light objects of strange/awkward sizes. Trial quadruped with low reps initially.    PT Home Exercise Plan 10/4: single leg bridges,  wall squats, sidelying hip ABD    Consulted and Agree with Plan of Care Patient      Patient will benefit from skilled therapeutic intervention in order to improve the following deficits and impairments:  Improper body mechanics, Pain, Increased muscle spasms, Decreased activity tolerance, Decreased strength, Decreased safety awareness, Impaired flexibility  Visit Diagnosis: Chronic midline low back pain, with sciatica presence unspecified - Plan: PT plan of care cert/re-cert  Muscle weakness (generalized) - Plan: PT plan of care cert/re-cert  Other symptoms and signs involving the musculoskeletal system - Plan: PT plan of care cert/re-cert     Problem List Patient Active Problem List   Diagnosis Date Noted  . OSA (obstructive sleep apnea) 07/04/2016  . Tobacco user 07/04/2016  . Facial cellulitis 03/15/2014  . Pain in joint, shoulder region 04/08/2013  . Muscle weakness (generalized) 04/08/2013  . Right rotator cuff tear 02/25/2013  . Gout 08/27/2012  . Foot infection 08/27/2012  . Achilles bursitis or tendinitis 08/27/2012  . Cervicalgia 06/24/2011  . Cervical disc disorder with radiculopathy 06/12/2011    Deniece Ree PT, DPT [facility] [address] [place], [place], [address] Phone: [phone]   Fax:  [phone]  Name: Mark Phillips MRN: [id number] Date of Birth: [birth year]

## 2016-07-10 NOTE — Patient Instructions (Signed)
  SINGLE LEG BRIDGE - MODIFIED  While lying on your back, raise your buttocks off the floor/bed into a bridge position.    Next straighten a leg so that only one leg is supporting your body. Then, return that leg back to the ground and change to the other side.      Try and maintain your pelvis level the entire time.  Repeat 10 times each side, twice a day.     HIP ABDUCTION - SIDELYING  While lying on your side, slowly raise up your top leg to the side, then hold for 2 seconds. Keep your knee straight and maintain your toes pointed forward the entire time. Keep your leg in-line with your body.  The bottom leg can be bent to stabilize your body.     MINI-WALL SQUATS  Leaning up against a wall or closed door on your back, slide your body downward so your knees are slightly bent and then return back to upright position.  A door was used here because it was smoother and had less friction than the wall.   Knees should bend in line with the 2nd toe and not pass the front of the foot.  Repeat 10-15 times, twice a day.

## 2016-07-15 ENCOUNTER — Ambulatory Visit (HOSPITAL_COMMUNITY): Payer: Managed Care, Other (non HMO)

## 2016-07-15 DIAGNOSIS — M6281 Muscle weakness (generalized): Secondary | ICD-10-CM

## 2016-07-15 DIAGNOSIS — G8929 Other chronic pain: Secondary | ICD-10-CM

## 2016-07-15 DIAGNOSIS — M545 Low back pain: Secondary | ICD-10-CM | POA: Diagnosis not present

## 2016-07-15 DIAGNOSIS — R29898 Other symptoms and signs involving the musculoskeletal system: Secondary | ICD-10-CM

## 2016-07-15 NOTE — Therapy (Signed)
[facility] [place], [place], [address] Phone: [phone]   Fax:  [phone]  Physical Therapy Treatment  Patient Details  Name: Mark Phillips MRN: [id number] Date of Birth: [birth year] Referring Provider: Glenna Fellows   Encounter Date: 07/15/2016      PT End of Session - 07/15/16 1551    Visit Number 2   Number of Visits 8   Date for PT Re-Evaluation 08/07/16   Authorization Type [employer] Managed    Authorization Time Period 07/10/16 to 08/07/16   PT Start Time 1515   PT Stop Time 1555   PT Time Calculation (min) 40 min   Activity Tolerance Patient tolerated treatment well;No increased pain   Behavior During Therapy WFL for tasks assessed/performed      Past Medical History:  Diagnosis Date  . Arthritis   . Essential hypertension   . GERD (gastroesophageal reflux disease)   . Hyperlipidemia   . 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 Sleep apnea   . Type 2 diabetes mellitus ([place])     Past Surgical History:  Procedure Laterality Date  . ANKLE RECONSTRUCTION     lt reconstruction and rt scope sx  . APPENDECTOMY  2000  . COLON SURGERY    . LUMBAR DISC SURGERY     July 2017 - Dr. Carloyn Manner  . SHOULDER ARTHROSCOPY WITH SUBACROMIAL DECOMPRESSION AND OPEN ROTATOR C Right 02/25/2013   Procedure: RIGHT SHOULDER ARTHROSCOPY WITH SUBACROMIAL DECOMPRESSION AND MINI OPEN ROTATOR CUFF REPAIR;  Surgeon: Johnn Hai, MD;  Location: [facility];  Service: Orthopedics;  Laterality: Right;  . WISDOM TOOTH EXTRACTION  2011    There were no vitals filed for this visit.      Subjective Assessment - 07/15/16 1519    Subjective Pt reports additional soreness today, his back very tight on the right. He reports he has been working on his HEP, without issue or complaint. He reports limited complaince over the weekend due to a busy schedule.    Currently in Pain? No/denies  soreness along the right paraspinals.                           [facility] Adult PT Treatment/Exercise - 07/15/16 0001      Exercises   Exercises Lumbar     Lumbar Exercises: Standing   Heel Raises 20 reps  1x20 bilat, unilateral (increase to 25x next session)   Wall Slides 10 reps  1x10, wall squats on door (     Lumbar Exercises: Seated   Sit to Stand Limitations 10x c 3000g ball: 40cm surface  Extensive cues to correct loss of lordosis at bottom     Lumbar Exercises: Supine   Bridge Other (comment)  1x10 (2-legged); 1x10 unilat bilat (HEP review)      Lumbar Exercises: Sidelying   Hip Abduction 10 reps  1x10 bilat; tactile form correction (HEP review)     Lumbar Exercises: Quadruped   Other Quadruped Lumbar Exercises Diaphragmatic breathing 10x c TrA activation                   PT Short Term Goals - 07/10/16 1218      PT SHORT TERM GOAL #1   Title Patient to demonstrate at least a 50% reduction in lumbar muscle knotting in order to reduce pain and improve functional mechanics    Time 2   Period Weeks  Status New     PT SHORT TERM GOAL #2   Title Patient to experience pain no more than 1/10 with all functional movements in order to show improvement of condition and assist in progression of return to PLOF    Time 2   Period Weeks   Status New     PT SHORT TERM GOAL #3   Title Patient to be independent in correctly and consistently perform appropriate HEP, to be updated PRN    Time 1   Period Weeks   Status New           PT Long Term Goals - 07/10/16 1221      PT LONG TERM GOAL #1   Title Patient to demonstrate functional LE and core strength as being 5/5 in order to assist in reducing pain and returning to PLOF based activities    Time 4   Period Weeks   Status New     PT LONG TERM GOAL #2   Title Patient to demonstrate correct functional lifting techniques with various sizes of boxes/loads in order to ensure safety and facilitate return to work with pain no more than  1/10 in order to assist in return to PLOF    Time 4   Period Weeks   Status New     PT LONG TERM GOAL #3   Title Patient to score no more than 20% limitation on FOTO survey in order to show general improvement of condition    Time 4   Period Weeks   Status New               Plan - 07/15/16 1552    Clinical Impression Statement Evaluation treatment goals, and HEP are reviewed in full. New exercises are added thsi session, with increase on core stabilization. Pt has not gotten updates on back precautions yet, which will be needed to eventually partiipate in higher level loading activities in later session.    Rehab Potential Good   PT Frequency 2x / week   PT Duration 4 weeks   PT Treatment/Interventions ADLs/Self Care Home Management;Biofeedback;Cryotherapy;Moist Heat;Stair training;Functional mobility training;Therapeutic activities;Therapeutic exercise;Balance training;Neuromuscular re-education;Patient/family education;Manual techniques;Scar mobilization;Energy conservation;Taping   PT Next Visit Plan focus on functional LE and core stabilization; lifting light objects of strange/awkward sizes. Continue c quadruped exercises as tolerate, with supervision for neutral lumbar spine.     PT Home Exercise Plan 10/4: single leg bridges, wall squats, sidelying hip ABD; 07/15/16: asked to work on diaphragmatic breathing with TrAbd activation at home.    Consulted and Agree with Plan of Care Patient      Patient will benefit from skilled therapeutic intervention in order to improve the following deficits and impairments:  Improper body mechanics, Pain, Increased muscle spasms, Decreased activity tolerance, Decreased strength, Decreased safety awareness, Impaired flexibility  Visit Diagnosis: Muscle weakness (generalized)  Chronic midline low back pain, with sciatica presence unspecified  Other symptoms and signs involving the musculoskeletal system     Problem List Patient Active  Problem List   Diagnosis Date Noted  . OSA (obstructive sleep apnea) 07/04/2016  . Tobacco user 07/04/2016  . Facial cellulitis 03/15/2014  . Pain in joint, shoulder region 04/08/2013  . Muscle weakness (generalized) 04/08/2013  . Right rotator cuff tear 02/25/2013  . Gout 08/27/2012  . Foot infection 08/27/2012  . Achilles bursitis or tendinitis 08/27/2012  . Cervicalgia 06/24/2011  . Cervical disc disorder with radiculopathy 06/12/2011    4:05 PM, 07/15/16 Etta Grandchild,  PT, DPT Physical Therapist at [facility] [phone] (office)     [facility] [address] [place], [place], [address] Phone: [phone]   Fax:  [phone]  Name: Mark Phillips MRN: [id number] Date of Birth: 1971/05/06

## 2016-07-17 ENCOUNTER — Ambulatory Visit (HOSPITAL_COMMUNITY): Payer: Managed Care, Other (non HMO) | Admitting: Physical Therapy

## 2016-07-17 DIAGNOSIS — R29898 Other symptoms and signs involving the musculoskeletal system: Secondary | ICD-10-CM

## 2016-07-17 DIAGNOSIS — G8929 Other chronic pain: Secondary | ICD-10-CM

## 2016-07-17 DIAGNOSIS — M545 Low back pain: Principal | ICD-10-CM

## 2016-07-17 DIAGNOSIS — M6281 Muscle weakness (generalized): Secondary | ICD-10-CM

## 2016-07-17 NOTE — Therapy (Signed)
[facility] [place], [place], [address] Phone: [phone]   Fax:  [phone]  Physical Therapy Treatment  Patient Details  Name: Mark Phillips MRN: [id number] Date of Birth: Feb 15, 1971 Referring Provider: Glenna Fellows   Encounter Date: 07/17/2016      PT End of Session - 07/17/16 1206    Visit Number 3   Number of Visits 8   Date for PT Re-Evaluation 08/07/16   Authorization Type [employer] Managed    Authorization Time Period 07/10/16 to 08/07/16   PT Start Time [id number]  patient arrived late    PT Stop Time 1155   PT Time Calculation (min) 30 min   Activity Tolerance Patient tolerated treatment well;No increased pain   Behavior During Therapy WFL for tasks assessed/performed      Past Medical History:  Diagnosis Date  . Arthritis   . Essential hypertension   . GERD (gastroesophageal reflux disease)   . Hyperlipidemia   . 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 Sleep apnea   . Type 2 diabetes mellitus ([place])     Past Surgical History:  Procedure Laterality Date  . ANKLE RECONSTRUCTION     lt reconstruction and rt scope sx  . APPENDECTOMY  2000  . COLON SURGERY    . LUMBAR DISC SURGERY     July 2017 - Dr. Carloyn Manner  . SHOULDER ARTHROSCOPY WITH SUBACROMIAL DECOMPRESSION AND OPEN ROTATOR C Right 02/25/2013   Procedure: RIGHT SHOULDER ARTHROSCOPY WITH SUBACROMIAL DECOMPRESSION AND MINI OPEN ROTATOR CUFF REPAIR;  Surgeon: Johnn Hai, MD;  Location: [facility];  Service: Orthopedics;  Laterality: Right;  . WISDOM TOOTH EXTRACTION  2011    There were no vitals filed for this visit.      Subjective Assessment - 07/17/16 1127    Subjective Patient arrives today stating that he is quite sore from his PT workout the other day but otherwise feeling quite good. No major changes since last session otherwise.    Pertinent History OSA, HTN, DM, history lumbar surgery July 2017: L5-S1 laminectomy/discectomy, R L3-L4  laminotomy and foraminontomy   Currently in Pain? No/denies  just general muscle soreness                          [facility] Adult PT Treatment/Exercise - 07/17/16 0001      Therapeutic Activites    Therapeutic Activities Lifting   Lifting light lifting 12 inches to overhead with empty BAPS board      Lumbar Exercises: Standing   Heel Raises Other (comment)   Heel Raises Limitations 25 reps U    Forward Lunge 10 reps   Forward Lunge Limitations 1000g ball push overhead    Wall Slides 10 reps   Wall Slides Limitations 5 second holds    Other Standing Lumbar Exercises sit to stand with 3000g ball 1x15; rockerboard AP and lateral with no UEs 2 min each direction    Other Standing Lumbar Exercises step ups 1x15, 6 inch box; lateral step ups with 1000g ball 1x15 6 inch box      Lumbar Exercises: Seated   Hip Flexion on Ball 10 reps   Hip Flexion on Ball Limitations air pad on swiss ball 1x10 with 2 second holds      Lumbar Exercises: Quadruped   Single Arm Raise 10 reps   Straight Leg Raise 10 reps   Opposite Arm/Leg Raise 5 reps;Right  arm/Left leg;Left arm/Right leg;1 second                PT Education - 07/17/16 1206    Education provided No          PT Short Term Goals - 07/10/16 1218      PT SHORT TERM GOAL #1   Title Patient to demonstrate at least a 50% reduction in lumbar muscle knotting in order to reduce pain and improve functional mechanics    Time 2   Period Weeks   Status New     PT SHORT TERM GOAL #2   Title Patient to experience pain no more than 1/10 with all functional movements in order to show improvement of condition and assist in progression of return to PLOF    Time 2   Period Weeks   Status New     PT SHORT TERM GOAL #3   Title Patient to be independent in correctly and consistently perform appropriate HEP, to be updated PRN    Time 1   Period Weeks   Status New           PT Long Term Goals - 07/10/16 1221      PT  LONG TERM GOAL #1   Title Patient to demonstrate functional LE and core strength as being 5/5 in order to assist in reducing pain and returning to PLOF based activities    Time 4   Period Weeks   Status New     PT LONG TERM GOAL #2   Title Patient to demonstrate correct functional lifting techniques with various sizes of boxes/loads in order to ensure safety and facilitate return to work with pain no more than 1/10 in order to assist in return to PLOF    Time 4   Period Weeks   Status New     PT LONG TERM GOAL #3   Title Patient to score no more than 20% limitation on FOTO survey in order to show general improvement of condition    Time 4   Period Weeks   Status New               Plan - 07/17/16 1206    Clinical Impression Statement Continued with functional strengthening and core activation strategies with limited weighted exercises (still waiting to hear back from MD about updated precautions/weight lifting recommendations). Introduced quadruped exercises today with cues for core activation as well as correct form with quadruped based activities. Also worked on functional lifting (approx. 12 inches from floor to overhead) with a light weight large object (empty BAPS board) and cues for form.    Rehab Potential Good   PT Frequency 2x / week   PT Duration 4 weeks   PT Treatment/Interventions ADLs/Self Care Home Management;Biofeedback;Cryotherapy;Moist Heat;Stair training;Functional mobility training;Therapeutic activities;Therapeutic exercise;Balance training;Neuromuscular re-education;Patient/family education;Manual techniques;Scar mobilization;Energy conservation;Taping   PT Next Visit Plan focus on functional LE and core stabilization; lifting light objects of strange/awkward sizes. Continue c quadruped exercises as tolerate, with supervision for neutral lumbar spine.     PT Home Exercise Plan 10/4: single leg bridges, wall squats, sidelying hip ABD; 07/15/16: asked to work on  diaphragmatic breathing with TrAbd activation at home.    Consulted and Agree with Plan of Care Patient      Patient will benefit from skilled therapeutic intervention in order to improve the following deficits and impairments:  Improper body mechanics, Pain, Increased muscle spasms, Decreased activity tolerance, Decreased strength, Decreased safety awareness, Impaired flexibility  Visit Diagnosis: Chronic midline low back pain, with sciatica presence unspecified  Muscle weakness (generalized)  Other symptoms and signs involving the musculoskeletal system     Problem List Patient Active Problem List   Diagnosis Date Noted  . OSA (obstructive sleep apnea) 07/04/2016  . Tobacco user 07/04/2016  . Facial cellulitis 03/15/2014  . Pain in joint, shoulder region 04/08/2013  . Muscle weakness (generalized) 04/08/2013  . Right rotator cuff tear 02/25/2013  . Gout 08/27/2012  . Foot infection 08/27/2012  . Achilles bursitis or tendinitis 08/27/2012  . Cervicalgia 06/24/2011  . Cervical disc disorder with radiculopathy 06/12/2011    Deniece Ree PT, DPT [facility] [address] [place], [place], [address] Phone: [phone]   Fax:  [phone]  Name: MELIH HARDEMAN MRN: [id number] Date of Birth: [birth year]

## 2016-07-22 ENCOUNTER — Ambulatory Visit (HOSPITAL_COMMUNITY): Payer: Managed Care, Other (non HMO) | Admitting: Physical Therapy

## 2016-07-22 ENCOUNTER — Telehealth (HOSPITAL_COMMUNITY): Payer: Self-pay

## 2016-07-22 NOTE — Telephone Encounter (Signed)
10/16 he left a messge a that he was running a fever and throwing up

## 2016-07-29 ENCOUNTER — Ambulatory Visit (HOSPITAL_COMMUNITY): Payer: Managed Care, Other (non HMO) | Admitting: Physical Therapy

## 2016-07-29 DIAGNOSIS — M545 Low back pain: Secondary | ICD-10-CM | POA: Diagnosis not present

## 2016-07-29 DIAGNOSIS — R29898 Other symptoms and signs involving the musculoskeletal system: Secondary | ICD-10-CM

## 2016-07-29 DIAGNOSIS — G8929 Other chronic pain: Secondary | ICD-10-CM

## 2016-07-29 DIAGNOSIS — M6281 Muscle weakness (generalized): Secondary | ICD-10-CM

## 2016-07-29 NOTE — Therapy (Signed)
[facility] [place], [place], [address] Phone: [phone]   Fax:  [phone]  Physical Therapy Treatment  Patient Details  Name: Mark Phillips MRN: [id number] Date of Birth: 1970/11/17 Referring Provider: Glenna Fellows   Encounter Date: 07/29/2016      PT End of Session - 07/29/16 1209    Visit Number 4   Number of Visits 8   Date for PT Re-Evaluation 08/07/16   Authorization Type [employer] Managed    Authorization Time Period 07/10/16 to 08/07/16   PT Start Time 1117   PT Stop Time 1157   PT Time Calculation (min) 40 min   Activity Tolerance Patient tolerated treatment well;No increased pain   Behavior During Therapy WFL for tasks assessed/performed      Past Medical History:  Diagnosis Date  . Arthritis   . Essential hypertension   . GERD (gastroesophageal reflux disease)   . Hyperlipidemia   . 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 Sleep apnea   . Type 2 diabetes mellitus ([place])     Past Surgical History:  Procedure Laterality Date  . ANKLE RECONSTRUCTION     lt reconstruction and rt scope sx  . APPENDECTOMY  2000  . COLON SURGERY    . LUMBAR DISC SURGERY     July 2017 - Dr. Carloyn Manner  . SHOULDER ARTHROSCOPY WITH SUBACROMIAL DECOMPRESSION AND OPEN ROTATOR C Right 02/25/2013   Procedure: RIGHT SHOULDER ARTHROSCOPY WITH SUBACROMIAL DECOMPRESSION AND MINI OPEN ROTATOR CUFF REPAIR;  Surgeon: Johnn Hai, MD;  Location: [facility];  Service: Orthopedics;  Laterality: Right;  . WISDOM TOOTH EXTRACTION  2011    There were no vitals filed for this visit.      Subjective Assessment - 07/29/16 1118    Subjective Patient arrives stating he is feeling better, he goes to the MD as soon as he leaves here today. The only problem he has is feeling a little weak in his back, is that his legs are killing him at night with the pain he had before surgery. The more that he does, the more it hurts. He does not think its  anything we've done here that started this; he states he had 3 ruptured discs but is wondering if one of the ones the MD did not shave might be hurting him now.    Pertinent History OSA, HTN, DM, history lumbar surgery July 2017: L5-S1 laminectomy/discectomy, R L3-L4 laminotomy and foraminontomy   Currently in Pain? No/denies                         [facility] Adult PT Treatment/Exercise - 07/29/16 0001      Lumbar Exercises: Standing   Heel Raises Other (comment)   Heel Raises Limitations 25 reps U   1000g ball    Forward Lunge 15 reps   Forward Lunge Limitations 1000g ball push overhead    Wall Slides 10 reps   Wall Slides Limitations 5 second holds, 1000g ball    Other Standing Lumbar Exercises sit to stand staggered stance 1x10 3000g ball; functional lifting of empty box to various height surfaces    Other Standing Lumbar Exercises step ups 1x15, 8 inch box; lateral step ups with 1000g ball 1x15 8 inch box ; step ups onto BOSU 1x10 B      Lumbar Exercises: Seated   Hip Flexion on Ball 15 reps   Hip Flexion on  Ball Limitations air pad: seated marches, alternate UE/LE flexion 2 second holds     Lumbar Exercises: Quadruped   Single Arm Raise 15 reps   Straight Leg Raise 15 reps   Opposite Arm/Leg Raise 5 reps;Right arm/Left leg;Left arm/Right leg;1 second                PT Education - 07/29/16 1209    Education provided Yes   Education Details updated HEP    Person(s) Educated Patient   Methods Demonstration;Explanation;Handout   Comprehension Verbalized understanding;Returned demonstration          PT Short Term Goals - 07/10/16 1218      PT SHORT TERM GOAL #1   Title Patient to demonstrate at least a 50% reduction in lumbar muscle knotting in order to reduce pain and improve functional mechanics    Time 2   Period Weeks   Status New     PT SHORT TERM GOAL #2   Title Patient to experience pain no more than 1/10 with all functional movements in  order to show improvement of condition and assist in progression of return to PLOF    Time 2   Period Weeks   Status New     PT SHORT TERM GOAL #3   Title Patient to be independent in correctly and consistently perform appropriate HEP, to be updated PRN    Time 1   Period Weeks   Status New           PT Long Term Goals - 07/10/16 1221      PT LONG TERM GOAL #1   Title Patient to demonstrate functional LE and core strength as being 5/5 in order to assist in reducing pain and returning to PLOF based activities    Time 4   Period Weeks   Status New     PT LONG TERM GOAL #2   Title Patient to demonstrate correct functional lifting techniques with various sizes of boxes/loads in order to ensure safety and facilitate return to work with pain no more than 1/10 in order to assist in return to PLOF    Time 4   Period Weeks   Status New     PT LONG TERM GOAL #3   Title Patient to score no more than 20% limitation on FOTO survey in order to show general improvement of condition    Time 4   Period Weeks   Status New               Plan - 07/29/16 1210    Clinical Impression Statement Patient arrives stating his main complaints are ongoing weakness in his back as well as pain he is having at night that is actually keeping him up, he is going straight to his MD after PT today to talk about this. Continued with functional strengthening with loads and in positions that are within MD precautions with good performance by patient, however continue to note significant core/trunk muscle weakness especially in challenging positions such as quadruped. Added quadruped exercise to HEP to promote further strengthening in this area.    Rehab Potential Good   PT Frequency 2x / week   PT Duration 4 weeks   PT Treatment/Interventions ADLs/Self Care Home Management;Biofeedback;Cryotherapy;Moist Heat;Stair training;Functional mobility training;Therapeutic activities;Therapeutic exercise;Balance  training;Neuromuscular re-education;Patient/family education;Manual techniques;Scar mobilization;Energy conservation;Taping   PT Next Visit Plan focus on functional LE and core stabilization; lifting light objects of strange/awkward sizes. Continue c quadruped exercises as tolerate, with supervision for  neutral lumbar spine.     PT Home Exercise Plan 10/4: single leg bridges, wall squats, sidelying hip ABD; 07/15/16: asked to work on diaphragmatic breathing with TrAbd activation at home.    Consulted and Agree with Plan of Care Patient      Patient will benefit from skilled therapeutic intervention in order to improve the following deficits and impairments:  Improper body mechanics, Pain, Increased muscle spasms, Decreased activity tolerance, Decreased strength, Decreased safety awareness, Impaired flexibility  Visit Diagnosis: Chronic midline low back pain, with sciatica presence unspecified  Muscle weakness (generalized)  Other symptoms and signs involving the musculoskeletal system     Problem List Patient Active Problem List   Diagnosis Date Noted  . OSA (obstructive sleep apnea) 07/04/2016  . Tobacco user 07/04/2016  . Facial cellulitis 03/15/2014  . Pain in joint, shoulder region 04/08/2013  . Muscle weakness (generalized) 04/08/2013  . Right rotator cuff tear 02/25/2013  . Gout 08/27/2012  . Foot infection 08/27/2012  . Achilles bursitis or tendinitis 08/27/2012  . Cervicalgia 06/24/2011  . Cervical disc disorder with radiculopathy 06/12/2011    Deniece Ree PT, DPT [facility] [address] [place], [place], [address] Phone: [phone]   Fax:  [phone]  Name: CANUTO PATYK MRN: [id number] Date of Birth: [birth year]

## 2016-07-29 NOTE — Patient Instructions (Signed)
   QUADRUPED ALTERNATE ARM AND LEG   While in a crawling position, brace at your abdominals and then slowly lift a leg and opposite arm upwards.   Maintain a level and stable pelvis and spine the entire time.  You can use a ruler or PVC pipe across your hips to make sure you are staying level.  Repeat 5 times each side, twice a day.

## 2016-07-31 ENCOUNTER — Ambulatory Visit (HOSPITAL_COMMUNITY): Payer: Managed Care, Other (non HMO) | Admitting: Physical Therapy

## 2016-07-31 DIAGNOSIS — M545 Low back pain: Principal | ICD-10-CM

## 2016-07-31 DIAGNOSIS — M6281 Muscle weakness (generalized): Secondary | ICD-10-CM

## 2016-07-31 DIAGNOSIS — R29898 Other symptoms and signs involving the musculoskeletal system: Secondary | ICD-10-CM

## 2016-07-31 DIAGNOSIS — G8929 Other chronic pain: Secondary | ICD-10-CM

## 2016-07-31 NOTE — Therapy (Signed)
[facility] [place], [place], [address] Phone: [phone]   Fax:  [phone]  Physical Therapy Treatment  Patient Details  Name: DAISY ATTWOOD MRN: [id number] Date of Birth: December 13, 1970 Referring Provider: Glenna Fellows   Encounter Date: 07/31/2016      PT End of Session - 07/31/16 1200    Visit Number 5   Number of Visits 8   Date for PT Re-Evaluation 08/07/16   Authorization Type [employer] Managed    Authorization Time Period 07/10/16 to 08/07/16   PT Start Time 1125  started on Nust[employer], not included in billing    PT Stop Time 1158   PT Time Calculation (min) 33 min   Activity Tolerance Patient tolerated treatment well;No increased pain   Behavior During Therapy WFL for tasks assessed/performed      Past Medical History:  Diagnosis Date  . Arthritis   . Essential hypertension   . GERD (gastroesophageal reflux disease)   . Hyperlipidemia   . 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 Sleep apnea   . Type 2 diabetes mellitus ([facility])     Past Surgical History:  Procedure Laterality Date  . ANKLE RECONSTRUCTION     lt reconstruction and rt scope sx  . APPENDECTOMY  2000  . COLON SURGERY    . LUMBAR DISC SURGERY     July 2017 - Dr. Carloyn Manner  . SHOULDER ARTHROSCOPY WITH SUBACROMIAL DECOMPRESSION AND OPEN ROTATOR C Right 02/25/2013   Procedure: RIGHT SHOULDER ARTHROSCOPY WITH SUBACROMIAL DECOMPRESSION AND MINI OPEN ROTATOR CUFF REPAIR;  Surgeon: Johnn Hai, MD;  Location: [facility];  Service: Orthopedics;  Laterality: Right;  . WISDOM TOOTH EXTRACTION  2011    There were no vitals filed for this visit.      Subjective Assessment - 07/31/16 1123    Subjective Patient arrives stating he is feeling OK, his back is a little painful today and his MD is planning on doing another MRI, has also taken him out of work until December. No changes in precautions per MD.    Pertinent History OSA, HTN, DM, history lumbar surgery  July 2017: L5-S1 laminectomy/discectomy, R L3-L4 laminotomy and foraminontomy   Currently in Pain? Yes   Pain Score 2    Pain Location Other (Comment)  thigh    Pain Orientation Left   Pain Descriptors / Indicators Discomfort   Pain Type Acute pain   Pain Radiating Towards down front of L thigh    Pain Onset In the past 7 days   Aggravating Factors  increased activity makes it hurt worse at night    Pain Relieving Factors laying flat on his back    Effect of Pain on Daily Activities none                          [facility] Adult PT Treatment/Exercise - 07/31/16 0001      Lumbar Exercises: Aerobic   Stationary Bike Nustep seat 12 for 8 minutes   not included in billing     Lumbar Exercises: Standing   Wall Slides 15 reps   Wall Slides Limitations 5 second holds    Other Standing Lumbar Exercises step ups forwards/lateral 1x15 on BOSU      Lumbar Exercises: Seated   Hip Flexion on Ball 10 reps   Hip Flexion on Ball Limitations air pad: hip flexion with 1000g ball push overhead; opposite UE/LE raise  with 2# weights; COM control strategy on air pad all directions x5      Lumbar Exercises: Quadruped   Opposite Arm/Leg Raise 10 reps   Opposite Arm/Leg Raise Limitations 5 second hold    Plank 1x10/3x20seconds traditional form; 1x10, 1x15 side planks B                 PT Education - 07/31/16 1200    Education provided Yes   Education Details possible DOMS    Person(s) Educated Patient   Methods Explanation   Comprehension Verbalized understanding          PT Short Term Goals - 07/10/16 1218      PT SHORT TERM GOAL #1   Title Patient to demonstrate at least a 50% reduction in lumbar muscle knotting in order to reduce pain and improve functional mechanics    Time 2   Period Weeks   Status New     PT SHORT TERM GOAL #2   Title Patient to experience pain no more than 1/10 with all functional movements in order to show improvement of condition and  assist in progression of return to PLOF    Time 2   Period Weeks   Status New     PT SHORT TERM GOAL #3   Title Patient to be independent in correctly and consistently perform appropriate HEP, to be updated PRN    Time 1   Period Weeks   Status New           PT Long Term Goals - 07/10/16 1221      PT LONG TERM GOAL #1   Title Patient to demonstrate functional LE and core strength as being 5/5 in order to assist in reducing pain and returning to PLOF based activities    Time 4   Period Weeks   Status New     PT LONG TERM GOAL #2   Title Patient to demonstrate correct functional lifting techniques with various sizes of boxes/loads in order to ensure safety and facilitate return to work with pain no more than 1/10 in order to assist in return to PLOF    Time 4   Period Weeks   Status New     PT LONG TERM GOAL #3   Title Patient to score no more than 20% limitation on FOTO survey in order to show general improvement of condition    Time 4   Period Weeks   Status New               Plan - 07/31/16 1201    Clinical Impression Statement Patient arrives today stating his MD is concerned about the pain he has been having at night and is going to do a MRI; no changes to precautions or PT exercises right now. Continued with general functional strengthening and core strengthening/stability program today, progressing some exercises with good form and no increased pain noted. Educated patient that he may experience DOMS after today's session.    Rehab Potential Good   PT Frequency 2x / week   PT Duration 4 weeks   PT Treatment/Interventions ADLs/Self Care Home Management;Biofeedback;Cryotherapy;Moist Heat;Stair training;Functional mobility training;Therapeutic activities;Therapeutic exercise;Balance training;Neuromuscular re-education;Patient/family education;Manual techniques;Scar mobilization;Energy conservation;Taping   PT Next Visit Plan focus on functional LE and core  stabilization; lifting light objects of strange/awkward sizes. Continue c quadruped exercises as tolerate, with supervision for neutral lumbar spine.     PT Home Exercise Plan 10/4: single leg bridges, wall squats, sidelying hip ABD;  07/15/16: asked to work on diaphragmatic breathing with TrAbd activation at home.    Consulted and Agree with Plan of Care Patient      Patient will benefit from skilled therapeutic intervention in order to improve the following deficits and impairments:  Improper body mechanics, Pain, Increased muscle spasms, Decreased activity tolerance, Decreased strength, Decreased safety awareness, Impaired flexibility  Visit Diagnosis: Chronic midline low back pain, with sciatica presence unspecified  Muscle weakness (generalized)  Other symptoms and signs involving the musculoskeletal system     Problem List Patient Active Problem List   Diagnosis Date Noted  . OSA (obstructive sleep apnea) 07/04/2016  . Tobacco user 07/04/2016  . Facial cellulitis 03/15/2014  . Pain in joint, shoulder region 04/08/2013  . Muscle weakness (generalized) 04/08/2013  . Right rotator cuff tear 02/25/2013  . Gout 08/27/2012  . Foot infection 08/27/2012  . Achilles bursitis or tendinitis 08/27/2012  . Cervicalgia 06/24/2011  . Cervical disc disorder with radiculopathy 06/12/2011    Deniece Ree PT, DPT [facility] [address] [place], [place], [address] Phone: [phone]   Fax:  [phone]  Name: MCCRAE CHANN MRN: [id number] Date of Birth: 19-Jul-1971

## 2016-08-01 ENCOUNTER — Emergency Department (HOSPITAL_COMMUNITY): Payer: Managed Care, Other (non HMO)

## 2016-08-01 ENCOUNTER — Emergency Department (HOSPITAL_COMMUNITY)
Admission: EM | Admit: 2016-08-01 | Discharge: 2016-08-01 | Disposition: A | Discharge status: Home / Self Care | Payer: Managed Care, Other (non HMO) | Attending: Emergency Medicine | Admitting: Emergency Medicine

## 2016-08-01 ENCOUNTER — Encounter (HOSPITAL_COMMUNITY): Payer: Self-pay | Admitting: Emergency Medicine

## 2016-08-01 DIAGNOSIS — R079 Chest pain, unspecified: Secondary | ICD-10-CM | POA: Insufficient documentation

## 2016-08-01 DIAGNOSIS — E119 Type 2 diabetes mellitus without complications: Secondary | ICD-10-CM | POA: Insufficient documentation

## 2016-08-01 DIAGNOSIS — I10 Essential (primary) hypertension: Secondary | ICD-10-CM | POA: Diagnosis not present

## 2016-08-01 DIAGNOSIS — F17210 Nicotine dependence, cigarettes, uncomplicated: Secondary | ICD-10-CM | POA: Insufficient documentation

## 2016-08-01 DIAGNOSIS — Z7984 Long term (current) use of oral hypoglycemic drugs: Secondary | ICD-10-CM | POA: Insufficient documentation

## 2016-08-01 DIAGNOSIS — K529 Noninfective gastroenteritis and colitis, unspecified: Secondary | ICD-10-CM | POA: Diagnosis not present

## 2016-08-01 DIAGNOSIS — R195 Other fecal abnormalities: Secondary | ICD-10-CM | POA: Diagnosis present

## 2016-08-01 DIAGNOSIS — Z79899 Other long term (current) drug therapy: Secondary | ICD-10-CM | POA: Diagnosis not present

## 2016-08-01 LAB — COMPREHENSIVE METABOLIC PANEL
ALK PHOS: 43 U/L (ref 38–126)
ALT: 30 U/L (ref 17–63)
ANION GAP: 7 (ref 5–15)
AST: 22 U/L (ref 15–41)
Albumin: 4.2 g/dL (ref 3.5–5.0)
BUN: 8 mg/dL (ref 6–20)
CALCIUM: 9.3 mg/dL (ref 8.9–10.3)
CO2: 24 mmol/L (ref 22–32)
Chloride: 103 mmol/L (ref 101–111)
Creatinine, Ser: 0.74 mg/dL (ref 0.61–1.24)
GFR calc non Af Amer: >60 mL/min (ref >60)
Glucose, Bld: 138 mg/dL — ABNORMAL HIGH (ref 65–99)
POTASSIUM: 3.7 mmol/L (ref 3.5–5.1)
SODIUM: 134 mmol/L — AB (ref 135–145)
Total Bilirubin: 0.5 mg/dL (ref 0.3–1.2)
Total Protein: 7.4 g/dL (ref 6.5–8.1)

## 2016-08-01 LAB — CBC WITH DIFFERENTIAL/PLATELET
BASOS ABS: 0 10*3/uL (ref 0.0–0.1)
BASOS PCT: 0 %
Eosinophils Absolute: 0.7 10*3/uL (ref 0.0–0.7)
Eosinophils Relative: 5 %
HEMATOCRIT: 45.3 % (ref 39.0–52.0)
HEMOGLOBIN: 15.7 g/dL (ref 13.0–17.0)
Lymphocytes Relative: 31 %
Lymphs Abs: 4.1 10*3/uL — ABNORMAL HIGH (ref 0.7–4.0)
MCH: 30.5 pg (ref 26.0–34.0)
MCHC: 34.7 g/dL (ref 30.0–36.0)
MCV: 88 fL (ref 78.0–100.0)
MONOS PCT: 8 %
Monocytes Absolute: 1.1 10*3/uL — ABNORMAL HIGH (ref 0.1–1.0)
NEUTROS ABS: 7.6 10*3/uL (ref 1.7–7.7)
NEUTROS PCT: 56 %
Platelets: 189 10*3/uL (ref 150–400)
RBC: 5.15 MIL/uL (ref 4.22–5.81)
RDW: 12.8 % (ref 11.5–15.5)
WBC: 13.5 10*3/uL — ABNORMAL HIGH (ref 4.0–10.5)

## 2016-08-01 LAB — LIPASE, BLOOD: Lipase: 19 U/L (ref 11–51)

## 2016-08-01 LAB — POC OCCULT BLOOD, ED: FECAL OCCULT BLD: NEGATIVE

## 2016-08-01 NOTE — ED Provider Notes (Signed)
[facility] DEPT Provider Note   CSN: [id number] Arrival date & time: 08/01/16  1459     History   Chief Complaint Chief Complaint  Patient presents with  . Rectal Bleeding    HPI Mark Phillips is a 45 y.o. male.Plains of diarrhea for approximate 2 weeks. Patient has also had intermittent diffuse crampy abdominal pain for a few weeks. No pain today. He vomited last time 2 days ago. He is presently hungry. Denies any nausea. No fever. He treated himself with Pepto-Bismol last night. He had a stool which was black today after taking Pepto-Bismol. He was seen by Dr.Gosrani in the office morning. Sent here for further evaluation. Presently asymptomatic except feels "tired". Denies fever no other associated symptoms  HPI  Past Medical History:  Diagnosis Date  . Arthritis   . Essential hypertension   . GERD (gastroesophageal reflux disease)   . Hyperlipidemia   . 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 Sleep apnea   . Type 2 diabetes mellitus [facility])     Patient Active Problem List   Diagnosis Date Noted  . OSA (obstructive sleep apnea) 07/04/2016  . Tobacco user 07/04/2016  . Facial cellulitis 03/15/2014  . Pain in joint, shoulder region 04/08/2013  . Muscle weakness (generalized) 04/08/2013  . Right rotator cuff tear 02/25/2013  . Gout 08/27/2012  . Foot infection 08/27/2012  . Achilles bursitis or tendinitis 08/27/2012  . Cervicalgia 06/24/2011  . Cervical disc disorder with radiculopathy 06/12/2011    Past Surgical History:  Procedure Laterality Date  . ANKLE RECONSTRUCTION     lt reconstruction and rt scope sx  . APPENDECTOMY  2000  . COLON SURGERY    . LUMBAR DISC SURGERY     July 2017 - Dr. Carloyn Manner  . SHOULDER ARTHROSCOPY WITH SUBACROMIAL DECOMPRESSION AND OPEN ROTATOR C Right 02/25/2013   Procedure: RIGHT SHOULDER ARTHROSCOPY WITH SUBACROMIAL DECOMPRESSION AND MINI OPEN ROTATOR CUFF REPAIR;  Surgeon: Johnn Hai, MD;  Location: [facility];  Service:  Orthopedics;  Laterality: Right;  . WISDOM TOOTH EXTRACTION  2011       Home Medications    Prior to Admission medications   Medication Sig Start Date End Date Taking? Authorizing Provider  atorvastatin (LIPITOR) 80 MG tablet Take 80 mg by mouth at bedtime. 07/08/16  Yes Historical Provider, MD  baclofen (LIORESAL) 10 MG tablet Take 10 mg by mouth 3 (three) times daily.   Yes Historical Provider, MD  Cholecalciferol (VITAMIN D3) 5000 units TABS Take 1 tablet by mouth daily.   Yes Historical Provider, MD  gabapentin (NEURONTIN) 300 MG capsule Take 300 mg by mouth 3 (three) times daily. 07/29/16  Yes Historical Provider, MD  GLIPIZIDE XL 5 MG 24 hr tablet Take 5 mg by mouth daily.  05/15/16  Yes Historical Provider, MD  lisinopril (PRINIVIL,ZESTRIL) 10 MG tablet Take 10 mg by mouth daily.   Yes Historical Provider, MD  metFORMIN (GLUCOPHAGE) 500 MG tablet Take 500 mg by mouth 2 (two) times daily with a meal.    Yes Historical Provider, MD  oxyCODONE-acetaminophen (PERCOCET/ROXICET) 5-325 MG tablet Take 1 tablet by mouth every 6 (six) hours as needed for moderate pain.  06/11/16   Historical Provider, MD    Family History Family History  Problem Relation Age of Onset  . Heart attack Mother   . Hypertension Mother   . Diabetes Mellitus II Mother   . COPD Father     Social History Social History  Substance Use Topics  . Smoking status: Current Every Day Smoker    Packs/day: 1.00    Years: 19.00    Types: Cigarettes    Start date: 06/13/1993  . Smokeless tobacco: Former Systems developer    Types: Chew    Quit date: 10/07/1993  . Alcohol use No     Allergies   Albuterol   Review of Systems Review of Systems  Constitutional: Positive for fatigue.  HENT: Negative.   Respiratory: Negative.   Cardiovascular: Positive for chest pain.       Syncope  Gastrointestinal: Positive for abdominal pain, diarrhea and vomiting.  Musculoskeletal: Negative.   Skin: Negative.   Allergic/Immunologic:  Positive for immunocompromised state.       Diabetic  Neurological: Negative.   Psychiatric/Behavioral: Negative.   All other systems reviewed and are negative.    Physical Exam Updated Vital Signs BP 113/72   Pulse 71   Temp 97.9 F (36.6 C) (Oral)   Resp 18   Ht 5\' 11"  (1.803 m)   Wt 203 lb (92.1 kg)   SpO2 93%   BMI 28.31 kg/m   Physical Exam  Constitutional: He appears well-developed and well-nourished.  HENT:  Head: Normocephalic and atraumatic.  Eyes: Conjunctivae are normal. Pupils are equal, round, and reactive to light.  Neck: Neck supple. No tracheal deviation present. No thyromegaly present.  Cardiovascular: Normal rate and regular rhythm.   No murmur heard. Pulmonary/Chest: Effort normal and breath sounds normal.  Abdominal: Soft. Bowel sounds are normal. He exhibits no distension. There is no tenderness.  Midline surgical scar  Genitourinary: Penis normal. Rectal exam shows guaiac negative stool.  Genitourinary Comments: Melatonin black stool Hemoccult negative  Musculoskeletal: Normal range of motion. He exhibits no edema or tenderness.  Neurological: He is alert. Coordination normal.  Skin: Skin is warm and dry. No rash noted.  Psychiatric: He has a normal mood and affect.  Nursing note and vitals reviewed.    ED Treatments / Results  Labs (all labs ordered are listed, but only abnormal results are displayed) Labs Reviewed  COMPREHENSIVE METABOLIC PANEL - Abnormal; Notable for the following:       Result Value   Sodium 134 (*)    Glucose, Bld 138 (*)    All other components within normal limits  CBC WITH DIFFERENTIAL/PLATELET - Abnormal; Notable for the following:    WBC 13.5 (*)    Lymphs Abs 4.1 (*)    Monocytes Absolute 1.1 (*)    All other components within normal limits  LIPASE, BLOOD  POC OCCULT BLOOD, ED  X-rays viewed by me Results for orders placed or performed during the hospital encounter of 08/01/16  Comprehensive metabolic panel    Result Value Ref Range   Sodium 134 (L) 135 - 145 mmol/L   Potassium 3.7 3.5 - 5.1 mmol/L   Chloride 103 101 - 111 mmol/L   CO2 24 22 - 32 mmol/L   Glucose, Bld 138 (H) 65 - 99 mg/dL   BUN 8 6 - 20 mg/dL   Creatinine, Ser 0.74 0.61 - 1.24 mg/dL   Calcium 9.3 8.9 - 10.3 mg/dL   Total Protein 7.4 6.5 - 8.1 g/dL   Albumin 4.2 3.5 - 5.0 g/dL   AST 22 15 - 41 U/L   ALT 30 17 - 63 U/L   Alkaline Phosphatase 43 38 - 126 U/L   Total Bilirubin 0.5 0.3 - 1.2 mg/dL   GFR calc non Af Amer >60 >60 mL/min  GFR calc Af Amer >60 >60 mL/min   Anion gap 7 5 - 15  CBC with Differential/Platelet  Result Value Ref Range   WBC 13.5 (H) 4.0 - 10.5 K/uL   RBC 5.15 4.22 - 5.81 MIL/uL   Hemoglobin 15.7 13.0 - 17.0 g/dL   HCT 45.3 39.0 - 52.0 %   MCV 88.0 78.0 - 100.0 fL   MCH 30.5 26.0 - 34.0 pg   MCHC 34.7 30.0 - 36.0 g/dL   RDW 12.8 11.5 - 15.5 %   Platelets 189 150 - 400 K/uL   Neutrophils Relative % 56 %   Neutro Abs 7.6 1.7 - 7.7 K/uL   Lymphocytes Relative 31 %   Lymphs Abs 4.1 (H) 0.7 - 4.0 K/uL   Monocytes Relative 8 %   Monocytes Absolute 1.1 (H) 0.1 - 1.0 K/uL   Eosinophils Relative 5 %   Eosinophils Absolute 0.7 0.0 - 0.7 K/uL   Basophils Relative 0 %   Basophils Absolute 0.0 0.0 - 0.1 K/uL  Lipase, blood  Result Value Ref Range   Lipase 19 11 - 51 U/L  POC occult blood, ED  Result Value Ref Range   Fecal Occult Bld NEGATIVE NEGATIVE   Dg Abd Acute W/chest  Result Date: 08/01/2016 CLINICAL DATA:  Pt states he has ongoing GI issues and has been followed by Dr. Anastasio Champion for this. Was seen at his office today for blood in stool and was sent here for evaluation. Pt denies any complaints currently. States he first noticed blood this morning, but has been having diarrhea for 2 weeks. Hx diabetes, GERD, HTN, smoker, lumbar surgery July 2017, colon resection due to ruptured colon, appendectomy EXAM: DG ABDOMEN ACUTE W/ 1V CHEST COMPARISON:  None. FINDINGS: There is no bowel dilation to  suggest obstruction.  No free air. There are air-fluid levels on the erect view within nondistended bowel primarily the colon. This is nonspecific. It may reflect gastroenteritis or a mild adynamic ileus. There are multiple surgical vascular clips adjacent to a bowel anastomosis staple line in the right mid to lower quadrant. No evidence of renal or ureteral stones. Soft tissues otherwise unremarkable. Heart, mediastinum and hila are unremarkable.  Lungs are clear. IMPRESSION: 1. No evidence of bowel obstruction or free air. 2. Scattered air-fluid levels suggests gastroenteritis or a low grade adynamic ileus. 3. No acute cardiopulmonary disease. Electronically Signed   By: Lajean Manes M.D.   On: 08/01/2016 16:52    EKG  EKG Interpretation None       Radiology Dg Abd Acute W/chest  Result Date: 08/01/2016 CLINICAL DATA:  Pt states he has ongoing GI issues and has been followed by Dr. Anastasio Champion for this. Was seen at his office today for blood in stool and was sent here for evaluation. Pt denies any complaints currently. States he first noticed blood this morning, but has been having diarrhea for 2 weeks. Hx diabetes, GERD, HTN, smoker, lumbar surgery July 2017, colon resection due to ruptured colon, appendectomy EXAM: DG ABDOMEN ACUTE W/ 1V CHEST COMPARISON:  None. FINDINGS: There is no bowel dilation to suggest obstruction.  No free air. There are air-fluid levels on the erect view within nondistended bowel primarily the colon. This is nonspecific. It may reflect gastroenteritis or a mild adynamic ileus. There are multiple surgical vascular clips adjacent to a bowel anastomosis staple line in the right mid to lower quadrant. No evidence of renal or ureteral stones. Soft tissues otherwise unremarkable. Heart, mediastinum and hila are  unremarkable.  Lungs are clear. IMPRESSION: 1. No evidence of bowel obstruction or free air. 2. Scattered air-fluid levels suggests gastroenteritis or a low grade adynamic  ileus. 3. No acute cardiopulmonary disease. Electronically Signed   By: Lajean Manes M.D.   On: 08/01/2016 16:52    Procedures Procedures (including critical care time)  Medications Ordered in ED Medications - No data to display   Initial Impression / Assessment and Plan / ED Course  I have reviewed the triage vital signs and the nursing notes.  Pertinent labs & imaging results that were available during my care of the patient were reviewed by me and considered in my medical decision making (see chart for details).  Clinical Course    In favor gastroenteritis versus ileus. Patient has bowel sounds. Appetite is good. plAn Imodium for diarrhea. Avoid dairy. Follow-up Dr. Anastasio Champion for referral to gastroenterologist if symptoms persist. There are no signs of dehydration  Final Clinical Impressions(s) / ED Diagnoses  Diagnoses #1 gastroenteritis  Final diagnoses:  None    New Prescriptions New Prescriptions   No medications on file     Orlie Dakin, MD 08/01/16 1805

## 2016-08-01 NOTE — Discharge Instructions (Signed)
No blood in your stool today .Take Imodium as directed for diarrhea. Make sure that you drink at least six 8 ounce glasses of water daily in order to stay well-hydrated. Avoid milk, cheese, ice cream or foods containing milk while having diarrhea. If symptoms persist, contact Dr.Gosrani for referral to a gastroenterologist

## 2016-08-01 NOTE — ED Triage Notes (Signed)
Pt states he has ongoing GI issues and has been followed by Dr. Anastasio Champion for this.  Was seen at his office today for blood in stool and was sent here for evaluation.  Pt denies any complaints currently.  States he first noticed blood this morning, but has been having diarrhea for 2 weeks.

## 2016-08-01 NOTE — ED Notes (Signed)
Pt reports diarrhea x "a few months", nausea and abdominal fullness. Pt c/o black stools today-states he called his pcp and was told to come to ED. Pt reports taking Pepto Bismol once last night and once the day before.

## 2016-08-05 ENCOUNTER — Ambulatory Visit (HOSPITAL_COMMUNITY): Payer: Managed Care, Other (non HMO) | Admitting: Physical Therapy

## 2016-08-05 DIAGNOSIS — R29898 Other symptoms and signs involving the musculoskeletal system: Secondary | ICD-10-CM

## 2016-08-05 DIAGNOSIS — G8929 Other chronic pain: Secondary | ICD-10-CM

## 2016-08-05 DIAGNOSIS — M545 Low back pain: Principal | ICD-10-CM

## 2016-08-05 DIAGNOSIS — M6281 Muscle weakness (generalized): Secondary | ICD-10-CM

## 2016-08-05 NOTE — Therapy (Signed)
[name] [address] [place], [place], [address] Phone: [phone]   Fax:  [phone]  Physical Therapy Treatment (Re-Assessment)  Patient Details  Name: Mark Phillips MRN: [id number] Date of Birth: 1971/07/29 Referring Provider: Glenna Fellows   Encounter Date: 08/05/2016      PT End of Session - 08/05/16 1205    Visit Number 6   Number of Visits 9   Date for PT Re-Evaluation 08/16/16   Authorization Type [employer] Managed    Authorization Time Period 46/2/70 to 35/0/09; recert done 38/18   PT Start Time 1120   PT Stop Time 1156   PT Time Calculation (min) 36 min   Activity Tolerance Patient tolerated treatment well   Behavior During Therapy [facility] for tasks assessed/performed      Past Medical History:  Diagnosis Date  . Arthritis   . Essential hypertension   . GERD (gastroesophageal reflux disease)   . Hyperlipidemia   . 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 Sleep apnea   . Type 2 diabetes mellitus ([place])     Past Surgical History:  Procedure Laterality Date  . ANKLE RECONSTRUCTION     lt reconstruction and rt scope sx  . APPENDECTOMY  2000  . COLON SURGERY    . LUMBAR DISC SURGERY     July 2017 - Dr. Carloyn Manner  . SHOULDER ARTHROSCOPY WITH SUBACROMIAL DECOMPRESSION AND OPEN ROTATOR C Right 02/25/2013   Procedure: RIGHT SHOULDER ARTHROSCOPY WITH SUBACROMIAL DECOMPRESSION AND MINI OPEN ROTATOR CUFF REPAIR;  Surgeon: Johnn Hai, MD;  Location: [facility];  Service: Orthopedics;  Laterality: Right;  . WISDOM TOOTH EXTRACTION  2011    There were no vitals filed for this visit.      Subjective Assessment - 08/05/16 1121    Subjective Patient arrives today stating that he is feeling quite sore in his back and hips today, he is not sure why as he felt OK after last session. He continues to have pain when he is laying down at night and is following up with his MD about this, they are in process of getting another MRI. He  does not really have any problems doing anything or getting around at the house, he has not returned to work yet however. His pain tends to increase with extended walking, he is not back to regular activities and rates himself as being around 45% on a subjective scale due to not being back to regular actitvities and pain after being active all day.    Pertinent History OSA, HTN, DM, history lumbar surgery July 2017: L5-S1 laminectomy/discectomy, R L3-L4 laminotomy and foraminontomy   How long can you sit comfortably? 10/30- 5-10 minutes before shifting around/leaning to the L    How long can you stand comfortably? 10/30- unlimited    How long can you walk comfortably? 10/30- unlimited    Patient Stated Goals reduce pain, get stronger, get back to work/PLOF    Currently in Pain? Yes   Pain Score 4    Pain Location Other (Comment)  lumbar spine and hips    Pain Orientation Right;Left;Lower   Pain Descriptors / Indicators Discomfort;Other (Comment)  general discomfort in spine, hip is more of a stabbing pain    Pain Type Acute pain   Pain Radiating Towards radiates down front of L leg to knee    Pain Onset 1 to 4 weeks ago   Pain Frequency Intermittent   Aggravating Factors  increased activity makes it hurt worse at night    Pain Relieving Factors laying flat on his back    Effect of Pain on Daily Activities none            [facility] PT Assessment - 08/05/16 0001      Assessment   Medical Diagnosis back pain post surgery    Referring Provider Glenna Fellows    Onset Date/Surgical Date --  July 2017   Next MD Visit Dr. Carloyn Manner December 1st     Precautions   Precaution Comments cannot lift more than a gallon of milk; can bend and lift as tolerated but with caution      Balance Screen   Has the patient fallen in the past 6 months No   Has the patient had a decrease in activity level because of a fear of falling?  No   Is the patient reluctant to leave their home because of a fear of falling?  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[employer]; stocking, lifting, with varying length shifts but 8-10 hours      Strength   Right Hip Flexion 5/5   Right Hip ABduction 4+/5   Left Hip Flexion 5/5   Left Hip ABduction 4+/5   Right Knee Flexion 5/5   Right Knee Extension 4+/5   Left Knee Flexion 4+/5   Left Knee Extension 4+/5   Right Ankle Dorsiflexion 4+/5   Left Ankle Dorsiflexion 4+/5     6 minute walk test results    Aerobic Endurance Distance Walked 748   Endurance additional comments 3MWT      High Level Balance   High Level Balance Comments SLS 30 seconds on foam pad, stopped by PT                      [facility] Adult PT Treatment/Exercise - 08/05/16 0001      Lumbar Exercises: Quadruped   Plank forward plank 4x20, L plank 2x15, R plank waived today due to shoulder pain                 PT Education - 08/05/16 1205    Education provided Yes   Education Details progress with skilled PT services, POC, benefits of general and core strengthening in back pain management    Person(s) Educated Patient   Methods Explanation   Comprehension Verbalized understanding          PT Short Term Goals - 08/05/16 1140      PT SHORT TERM GOAL #1   Title Patient to demonstrate at least a 50% reduction in lumbar muscle knotting in order to reduce pain and improve functional mechanics    Baseline 10/30- patient reports decrease in frequency of symptoms, depends on what/how much he has done through the day    Time 2   Period Weeks   Status Partially Met     PT SHORT TERM GOAL #2   Title Patient to experience pain no more than 1/10 with all functional movements in order to show improvement of condition and assist in progression of return to PLOF    Baseline 57/26- complicated by new onset of  acute pain to L side, being investigated by MD    Time 2   Period Weeks   Status On-going  PT SHORT TERM GOAL #3   Title Patient to be independent in correctly and consistently perform appropriate HEP, to be updated PRN    Baseline 10/30- reports compliance except for when he has been sick    Time 1   Period Weeks   Status Achieved           PT Long Term Goals - 08/05/16 1143      PT LONG TERM GOAL #1   Title Patient to demonstrate functional LE and core strength as being 5/5 in order to assist in reducing pain and returning to PLOF based activities    Time 4   Period Weeks   Status Partially Met     PT LONG TERM GOAL #2   Title Patient to demonstrate correct functional lifting techniques with various sizes of boxes/loads in order to ensure safety and facilitate return to work with pain no more than 1/10 in order to assist in return to PLOF    Time 4   Period Weeks   Status Achieved     PT LONG TERM GOAL #3   Title Patient to score no more than 20% limitation on FOTO survey in order to show general improvement of condition    Baseline 10/30- DNT    Time 4   Period Weeks   Status On-going               Plan - 08/05/16 1207    Clinical Impression Statement Re-assessment performed today. Patient does show some improved gait tolerance and mechanics as well as some improved strength; he does state that he has continued to be limited by ongoing L sided radicular pain which has been getting worse and that his MD is investigating/is planning to do an MRI for. Due to some objective improvement, as well as some perceived subjective improvement, recommend a short extension of skilled PT services to continue addressing core strength primarily and for transition to advanced HEP before DC.    Rehab Potential Good   PT Frequency Other (comment)  3 more sessions    PT Duration Other (comment)  3 more sessions    PT Treatment/Interventions ADLs/Self Care Home  Management;Biofeedback;Cryotherapy;Moist Heat;Stair training;Functional mobility training;Therapeutic activities;Therapeutic exercise;Balance training;Neuromuscular re-education;Patient/family education;Manual techniques;Scar mobilization;Energy conservation;Taping   PT Next Visit Plan focus on advanced functional core strength and functional lifting. Update HEP regularly. Likely DC in 3 more sessions.    PT Home Exercise Plan 10/4: single leg bridges, wall squats, sidelying hip ABD; 07/15/16: asked to work on diaphragmatic breathing with TrAbd activation at home.    Consulted and Agree with Plan of Care Patient      Patient will benefit from skilled therapeutic intervention in order to improve the following deficits and impairments:  Improper body mechanics, Pain, Increased muscle spasms, Decreased activity tolerance, Decreased strength, Decreased safety awareness, Impaired flexibility  Visit Diagnosis: Chronic midline low back pain, with sciatica presence unspecified - Plan: PT plan of care cert/re-cert  Muscle weakness (generalized) - Plan: PT plan of care cert/re-cert  Other symptoms and signs involving the musculoskeletal system - Plan: PT plan of care cert/re-cert     Problem List Patient Active Problem List   Diagnosis Date Noted  . OSA (obstructive sleep apnea) 07/04/2016  . Tobacco user 07/04/2016  . Facial cellulitis 03/15/2014  . Pain in joint, shoulder region 04/08/2013  . Muscle weakness (generalized) 04/08/2013  . Right rotator cuff tear 02/25/2013  . Gout 08/27/2012  . Foot infection 08/27/2012  . Achilles  bursitis or tendinitis 08/27/2012  . Cervicalgia 06/24/2011  . Cervical disc disorder with radiculopathy 06/12/2011    Deniece Ree PT, DPT [facility] [address] [place], [place], [address] Phone: [phone]   Fax:  [phone]  Name: ORRIN YURKOVICH MRN: [id number] Date of Birth:  Mar 04, 1971

## 2016-08-07 ENCOUNTER — Ambulatory Visit (HOSPITAL_COMMUNITY): Payer: Managed Care, Other (non HMO) | Attending: Neurosurgery | Admitting: Physical Therapy

## 2016-08-07 DIAGNOSIS — R29898 Other symptoms and signs involving the musculoskeletal system: Secondary | ICD-10-CM | POA: Diagnosis present

## 2016-08-07 DIAGNOSIS — G8929 Other chronic pain: Secondary | ICD-10-CM | POA: Insufficient documentation

## 2016-08-07 DIAGNOSIS — M545 Low back pain: Secondary | ICD-10-CM | POA: Insufficient documentation

## 2016-08-07 DIAGNOSIS — M6281 Muscle weakness (generalized): Secondary | ICD-10-CM | POA: Insufficient documentation

## 2016-08-07 NOTE — Therapy (Signed)
[facility] [place], [place], [address] Phone: [phone]   Fax:  [phone]  Physical Therapy Treatment  Patient Details  Name: Mark Phillips MRN: [id number] Date of Birth: [birth year] Referring Provider: Glenna Fellows   Encounter Date: 08/07/2016      PT End of Session - 08/07/16 1153    Visit Number 7   Number of Visits 9   Date for PT Re-Evaluation 08/16/16   Authorization Type [employer] Managed    Authorization Time Period 48/5/46 to 27/0/35; recert done 00/93   PT Start Time 1118   PT Stop Time 1150  ended session on Nustep, not included in billing    PT Time Calculation (min) 32 min   Activity Tolerance Patient tolerated treatment well   Behavior During Therapy [facility] for tasks assessed/performed      Past Medical History:  Diagnosis Date  . Arthritis   . Essential hypertension   . GERD (gastroesophageal reflux disease)   . Hyperlipidemia   . 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 Sleep apnea   . Type 2 diabetes mellitus ([place])     Past Surgical History:  Procedure Laterality Date  . ANKLE RECONSTRUCTION     lt reconstruction and rt scope sx  . APPENDECTOMY  2000  . COLON SURGERY    . LUMBAR DISC SURGERY     July 2017 - Dr. Carloyn Manner  . SHOULDER ARTHROSCOPY WITH SUBACROMIAL DECOMPRESSION AND OPEN ROTATOR C Right 02/25/2013   Procedure: RIGHT SHOULDER ARTHROSCOPY WITH SUBACROMIAL DECOMPRESSION AND MINI OPEN ROTATOR CUFF REPAIR;  Surgeon: Johnn Hai, MD;  Location: [facility];  Service: Orthopedics;  Laterality: Right;  . WISDOM TOOTH EXTRACTION  2011    There were no vitals filed for this visit.      Subjective Assessment - 08/07/16 1121    Subjective Patient arrives today stating no major changes since last session, he continues to have pain going into his L LE that is keeping him up at night.    Pertinent History OSA, HTN, DM, history lumbar surgery July 2017: L5-S1 laminectomy/discectomy, R L3-L4  laminotomy and foraminontomy   Currently in Pain? Yes   Pain Score 3    Pain Location Leg   Pain Orientation Left                         [facility] Adult PT Treatment/Exercise - 08/07/16 0001      Lumbar Exercises: Standing   Functional Squats 10 reps   Functional Squats Limitations 2 sets on BOSU    Other Standing Lumbar Exercises step ups forward/lateral on BOSU 1x15 no UEs    Other Standing Lumbar Exercises standing marches on BOSU with core activation 1x20      Lumbar Exercises: Seated   Hip Flexion on Ball 15 reps   Hip Flexion on Ball Limitations seated opposite UE/LE with 1# UEs, 2# LEs      Lumbar Exercises: Quadruped   Opposite Arm/Leg Raise 10 reps   Opposite Arm/Leg Raise Limitations 2# LEs, 1# UEs    Plank forward planks 3x25 seconds; L side plank 2x20 seconds, R side plank 1x20 seconds       Nustep EOS, hills 4, resistance 2+ for 10 minutes (not included in billing)          PT Education - 08/07/16 1153    Education provided Yes   Education Details HEP update, modification for  home use to reduce stress on shoulder    Person(s) Educated Patient   Methods Explanation   Comprehension Verbalized understanding          PT Short Term Goals - 08/05/16 1140      PT SHORT TERM GOAL #1   Title Patient to demonstrate at least a 50% reduction in lumbar muscle knotting in order to reduce pain and improve functional mechanics    Baseline 10/30- patient reports decrease in frequency of symptoms, depends on what/how much he has done through the day    Time 2   Period Weeks   Status Partially Met     PT SHORT TERM GOAL #2   Title Patient to experience pain no more than 1/10 with all functional movements in order to show improvement of condition and assist in progression of return to PLOF    Baseline 09/47- complicated by new onset of acute pain to L side, being investigated by MD    Time 2   Period Weeks   Status On-going     PT SHORT TERM GOAL #3    Title Patient to be independent in correctly and consistently perform appropriate HEP, to be updated PRN    Baseline 10/30- reports compliance except for when he has been sick    Time 1   Period Weeks   Status Achieved           PT Long Term Goals - 08/05/16 1143      PT LONG TERM GOAL #1   Title Patient to demonstrate functional LE and core strength as being 5/5 in order to assist in reducing pain and returning to PLOF based activities    Time 4   Period Weeks   Status Partially Met     PT LONG TERM GOAL #2   Title Patient to demonstrate correct functional lifting techniques with various sizes of boxes/loads in order to ensure safety and facilitate return to work with pain no more than 1/10 in order to assist in return to PLOF    Time 4   Period Weeks   Status Achieved     PT LONG TERM GOAL #3   Title Patient to score no more than 20% limitation on FOTO survey in order to show general improvement of condition    Baseline 10/30- DNT    Time 4   Period Weeks   Status On-going               Plan - 08/07/16 1154    Clinical Impression Statement Continued with progression of functional strengthening with advanced exercises focused on core activation and activity; progressed activities as appropriate and within MD precautions, with only occasional cues for form and adjustments of technique as needed due to shoulder sensitivity/pain. Patient is progressing well overall; updated HEP today with forward and side planks modified as needed to adjust for shoulder pain.    Rehab Potential Good   PT Frequency Other (comment)  2 more sessions    PT Duration Other (comment)  2 more sessions    PT Treatment/Interventions ADLs/Self Care Home Management;Biofeedback;Cryotherapy;Moist Heat;Stair training;Functional mobility training;Therapeutic activities;Therapeutic exercise;Balance training;Neuromuscular re-education;Patient/family education;Manual techniques;Scar mobilization;Energy  conservation;Taping   PT Next Visit Plan focus on advanced functional core strength and functional lifting. Update HEP regularly. Likely DC in 2 more sessions. Introduce/assess tolerance to fire hydrants and quadruped ABD next session.    PT Home Exercise Plan 10/4: single leg bridges, wall squats, sidelying hip ABD; 07/15/16: asked to work  on diaphragmatic breathing with TrAbd activation at home. 11/1: added forward and modified side planks   Consulted and Agree with Plan of Care Patient      Patient will benefit from skilled therapeutic intervention in order to improve the following deficits and impairments:  Improper body mechanics, Pain, Increased muscle spasms, Decreased activity tolerance, Decreased strength, Decreased safety awareness, Impaired flexibility  Visit Diagnosis: Chronic midline low back pain, with sciatica presence unspecified  Muscle weakness (generalized)  Other symptoms and signs involving the musculoskeletal system     Problem List Patient Active Problem List   Diagnosis Date Noted  . OSA (obstructive sleep apnea) 07/04/2016  . Tobacco user 07/04/2016  . Facial cellulitis 03/15/2014  . Pain in joint, shoulder region 04/08/2013  . Muscle weakness (generalized) 04/08/2013  . Right rotator cuff tear 02/25/2013  . Gout 08/27/2012  . Foot infection 08/27/2012  . Achilles bursitis or tendinitis 08/27/2012  . Cervicalgia 06/24/2011  . Cervical disc disorder with radiculopathy 06/12/2011    Deniece Ree PT, DPT [facility] [address] [place], [place], [address] Phone: [phone]   Fax:  [phone]  Name: ALEJO BEAMER MRN: [id number] Date of Birth: Oct 09, 1970

## 2016-08-07 NOTE — Patient Instructions (Addendum)
   PLANK  While lying face down, lift your body up on your elbows and toes. Try and maintain a straight spine. Do not allow your hips or pelvis on either side to drop.  Hold for 20-25 seconds, then relax. Repeat 3-4 times, 1-2 times per day. Add 5 seconds every 3-4 days.     LATERAL PLANK MODIFIED  While lying on your side with your knees bent, lift your body up on your elbow and knees. Try and maintain a straight spine.  Hold for 15-20 seconds, twice each side, 1-2 times per day. Add 5 seconds every 3-4 days.

## 2016-08-10 ENCOUNTER — Encounter: Payer: Self-pay | Admitting: Pulmonary Disease

## 2016-08-12 ENCOUNTER — Encounter: Payer: Self-pay | Admitting: Internal Medicine

## 2016-08-14 ENCOUNTER — Encounter: Payer: Self-pay | Admitting: Nurse Practitioner

## 2016-08-14 ENCOUNTER — Ambulatory Visit: Payer: Managed Care, Other (non HMO) | Admitting: Nurse Practitioner

## 2016-08-14 ENCOUNTER — Telehealth: Payer: Self-pay | Admitting: Nurse Practitioner

## 2016-08-14 ENCOUNTER — Ambulatory Visit (HOSPITAL_COMMUNITY): Payer: Managed Care, Other (non HMO)

## 2016-08-14 DIAGNOSIS — M545 Low back pain: Principal | ICD-10-CM

## 2016-08-14 DIAGNOSIS — R29898 Other symptoms and signs involving the musculoskeletal system: Secondary | ICD-10-CM

## 2016-08-14 DIAGNOSIS — M6281 Muscle weakness (generalized): Secondary | ICD-10-CM

## 2016-08-14 DIAGNOSIS — G8929 Other chronic pain: Secondary | ICD-10-CM

## 2016-08-14 NOTE — Telephone Encounter (Signed)
Noted  

## 2016-08-14 NOTE — Patient Instructions (Signed)
   PIRIFORMIS AND HIP STRETCH - SEATED  While sitting in a chair, cross your affected leg on top of the other as shown.   Next, gently lean forward until a stretch is felt along the crossed leg.   2 x 30 seconds on each side    Roll a towel up long ways, and place it along your spine, and lay down on your back.  Bend both knees up.  Hold this position for 1-2 minutes.   Allow your shoulders to drop down towards the floor/bed and focus on taking deep breaths.

## 2016-08-14 NOTE — Progress Notes (Deleted)
Primary Care Physician:  Doree Albee, MD Primary Gastroenterologist:  Dr.   Rayne Du chief complaint on file.   HPI:   Mark Phillips is a 45 y.o. male who presents on referral from PCP for black stool and rectal bleeding. Patient has not been seen by GI in our system. Noted history of GERD. PCP notes reviewed.   Today he states   Past Medical History:  Diagnosis Date  . Arthritis   . Essential hypertension   . GERD (gastroesophageal reflux disease)   . Hyperlipidemia   . 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 Sleep apnea   . Type 2 diabetes mellitus ([place])     Past Surgical History:  Procedure Laterality Date  . ANKLE RECONSTRUCTION     lt reconstruction and rt scope sx  . APPENDECTOMY  2000  . COLON SURGERY    . LUMBAR DISC SURGERY     July 2017 - Dr. Carloyn Manner  . SHOULDER ARTHROSCOPY WITH SUBACROMIAL DECOMPRESSION AND OPEN ROTATOR C Right 02/25/2013   Procedure: RIGHT SHOULDER ARTHROSCOPY WITH SUBACROMIAL DECOMPRESSION AND MINI OPEN ROTATOR CUFF REPAIR;  Surgeon: Johnn Hai, MD;  Location: [facility];  Service: Orthopedics;  Laterality: Right;  . WISDOM TOOTH EXTRACTION  2011    Current Outpatient Prescriptions  Medication Sig Dispense Refill  . atorvastatin (LIPITOR) 80 MG tablet Take 80 mg by mouth at bedtime.    . baclofen (LIORESAL) 10 MG tablet Take 10 mg by mouth 3 (three) times daily.    . Cholecalciferol (VITAMIN D3) 5000 units TABS Take 1 tablet by mouth daily.    [name] gabapentin (NEURONTIN) 300 MG capsule Take 300 mg by mouth 3 (three) times daily.    [name] GLIPIZIDE XL 5 MG 24 hr tablet Take 5 mg by mouth daily.     [name] lisinopril (PRINIVIL,ZESTRIL) 10 MG tablet Take 10 mg by mouth daily.    . metFORMIN (GLUCOPHAGE) 500 MG tablet Take 500 mg by mouth 2 (two) times daily with a meal.     . oxyCODONE-acetaminophen (PERCOCET/ROXICET) 5-325 MG tablet Take 1 tablet by mouth every 6 (six) hours as needed for moderate pain.      No current facility-administered  medications for this visit.     Allergies as of 08/14/2016 - Review Complete 08/01/2016  Allergen Reaction Noted  . Albuterol Swelling 06/05/2011    Family History  Problem Relation Age of Onset  . Heart attack Mother   . Hypertension Mother   . Diabetes Mellitus II Mother   . COPD Father     Social History   Social History  . Marital status: Married    Spouse name: N/A  . Number of children: N/A  . Years of education: N/A   Occupational History  . Not on file.   Social History Main Topics  . Smoking status: Current Every Day Smoker    Packs/day: 1.00    Years: 19.00    Types: Cigarettes    Start date: 06/13/1993  . Smokeless tobacco: Former Systems developer    Types: Chew    Quit date: 10/07/1993  . Alcohol use No  . Drug use: No  . Sexual activity: Not on file   Other Topics Concern  . Not on file   Social History Narrative  . No narrative on file    Review of Systems: General: Negative for anorexia, weight loss, fever, chills, fatigue, weakness. Eyes: Negative for vision changes.  ENT: Negative for hoarseness,  difficulty swallowing , nasal congestion. CV: Negative for chest pain, angina, palpitations, dyspnea on exertion, peripheral edema.  Respiratory: Negative for dyspnea at rest, dyspnea on exertion, cough, sputum, wheezing.  GI: See history of present illness. GU:  Negative for dysuria, hematuria, urinary incontinence, urinary frequency, nocturnal urination.  MS: Negative for joint pain, low back pain.  Derm: Negative for rash or itching.  Neuro: Negative for weakness, abnormal sensation, seizure, frequent headaches, memory loss, confusion.  Psych: Negative for anxiety, depression, suicidal ideation, hallucinations.  Endo: Negative for unusual weight change.  Heme: Negative for bruising or bleeding. Allergy: Negative for rash or hives.    Physical Exam: There were no vitals taken for this visit. General:   Alert and oriented. Pleasant and cooperative.  Well-nourished and well-developed.  Head:  Normocephalic and atraumatic. Eyes:  Without icterus, sclera clear and conjunctiva pink.  Ears:  Normal auditory acuity. Mouth:  No deformity or lesions, oral mucosa pink.  Throat/Neck:  Supple, without mass or thyromegaly. Cardiovascular:  S1, S2 present without murmurs appreciated. Normal pulses noted. Extremities without clubbing or edema. Respiratory:  Clear to auscultation bilaterally. No wheezes, rales, or rhonchi. No distress.  Gastrointestinal:  +BS, soft, non-tender and non-distended. No HSM noted. No guarding or rebound. No masses appreciated.  Rectal:  Deferred  Musculoskalatal:  Symmetrical without gross deformities. Normal posture. Skin:  Intact without significant lesions or rashes. Neurologic:  Alert and oriented x4;  grossly normal neurologically. Psych:  Alert and cooperative. Normal mood and affect. Heme/Lymph/Immune: No significant cervical adenopathy. No excessive bruising noted.    08/14/2016 1:24 PM   Disclaimer: This note was dictated with voice recognition software. Similar sounding words can inadvertently be transcribed and may not be corrected upon review.

## 2016-08-14 NOTE — Telephone Encounter (Signed)
Pt was a no show and letter sent  °

## 2016-08-15 ENCOUNTER — Encounter: Payer: Self-pay | Admitting: Acute Care

## 2016-08-15 ENCOUNTER — Ambulatory Visit (INDEPENDENT_AMBULATORY_CARE_PROVIDER_SITE_OTHER): Payer: Managed Care, Other (non HMO) | Admitting: Acute Care

## 2016-08-15 DIAGNOSIS — G4733 Obstructive sleep apnea (adult) (pediatric): Secondary | ICD-10-CM

## 2016-08-15 MED ORDER — CHLORHEXIDINE GLUCONATE 0.12 % MT SOLN: 15.0000 mL | Freq: Every day | OROMUCOSAL | 0 refills | Status: DC | PRN

## 2016-08-15 NOTE — Progress Notes (Signed)
History of Present Illness Mark Phillips is a 45 y.o. male with OSA since 2004 , and a 20 pack year smoking history followed by Dr. Corrie Dandy for CPAP treatment.   08/15/2016 Follow Up OSA Pt. Presents to the office for follow up for OSA and CPAP use after getting a new machine.Mark Phillips He is doing well on CPAP. He has had  a few leak issues, but AHI is very well controlled at 0.6. He does have a beard which may be the reason for the leak. He does complain of dry mouth in the morning We will prescribe peridex mouthwash. He has no daytime sleepiness, or hypersomnia.Mark Phillips He is compliant every night with use for greater than 4 hours. He has adequate equipment, and states he is tolerating his new mask well.  Tests  CPAP Down Load 07/16/2016-08/14/2016:  Auto Set 5-15 cm H2O Usage 30/30 nights ( 100%) > 4 hours all 30 days Median pressure 10.6 cm H2O AHI= 0.6 Leaks median 9.3   Past medical hx Past Medical History:  Diagnosis Date  . Arthritis   . Essential hypertension   . GERD (gastroesophageal reflux disease)   . Hyperlipidemia   . 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 Sleep apnea   . Type 2 diabetes mellitus (HCC)      Past surgical hx, Family hx, Social hx all reviewed.  Current Outpatient Prescriptions on File Prior to Visit  Medication Sig  . atorvastatin (LIPITOR) 80 MG tablet Take 80 mg by mouth at bedtime.  . baclofen (LIORESAL) 10 MG tablet Take 10 mg by mouth 3 (three) times daily.  . Cholecalciferol (VITAMIN D3) 5000 units TABS Take 1 tablet by mouth daily.  Mark Phillips gabapentin (NEURONTIN) 300 MG capsule Take 300 mg by mouth 3 (three) times daily.  Mark Phillips GLIPIZIDE XL 5 MG 24 hr tablet Take 5 mg by mouth daily.   Mark Phillips lisinopril (PRINIVIL,ZESTRIL) 10 MG tablet Take 10 mg by mouth daily.  . metFORMIN (GLUCOPHAGE) 500 MG tablet Take 500 mg by mouth 2 (two) times daily with a meal.   . oxyCODONE-acetaminophen (PERCOCET/ROXICET) 5-325 MG tablet Take 1 tablet by mouth every 6 (six)  hours as needed for moderate pain.    No current facility-administered medications on file prior to visit.      Allergies  Allergen Reactions  . Albuterol Swelling    Review Of Systems:  Constitutional:   No  weight loss, night sweats,  Fevers, chills, fatigue, or  lassitude.  HEENT:   No headaches,  Difficulty swallowing,  Tooth/dental problems, or  Sore throat,                No sneezing, itching, ear ache, nasal congestion, post nasal drip,   CV:  No chest pain,  Orthopnea, PND, swelling in lower extremities, anasarca, dizziness, palpitations, syncope.   GI  No heartburn, indigestion, abdominal pain, nausea, vomiting, diarrhea, change in bowel habits, loss of appetite, bloody stools.   Resp: No shortness of breath with exertion or at rest.  No excess mucus, no productive cough,  No non-productive cough,  No coughing up of blood.  No change in color of mucus.  No wheezing.  No chest wall deformity  Skin: no rash or lesions.  GU: no dysuria, change in color of urine, no urgency or frequency.  No flank pain, no hematuria   MS:  No joint pain or swelling.  No decreased range of motion.  No back pain.  Psych:  No change in mood or affect. No depression or anxiety.  No memory loss.   Vital Signs BP 116/78 (BP Location: Left Arm, Patient Position: Sitting, Cuff Size: Normal)   Pulse 73   Ht 5\' 11"  (1.803 m)   Wt 207 lb (93.9 kg)   SpO2 98%   BMI 28.87 kg/m    Physical Exam:  General- No distress,  A&Ox3, pleasant ENT: No sinus tenderness, TM clear, pale nasal mucosa, no oral exudate,no post nasal drip, no LAN Cardiac: S1, S2, regular rate and rhythm, no murmur Chest: No wheeze/ rales/ dullness; no accessory muscle use, no nasal flaring, no sternal retractions Abd.: Soft Non-tender Ext: No clubbing cyanosis, edema Neuro:  normal strength Skin: No rashes, warm and dry, small callous over bridge of nose 2/2 old CPAP mask. Psych: normal mood and  behavior   Assessment/Plan  OSA (obstructive sleep apnea) Tolerating new CPAP machine and mask well. 100% compliance  AHI 0.6 Dry mouth in am despite humidification. Plan: Peridex moisturizing mouthwash for dry mouth. Continue on CPAP at bedtime. You appear to be benefiting from the treatment Goal is to wear for at least 4-6 hours each night for maximal clinical benefit. Continue to work on weight loss, as the link between excess weight  and sleep apnea is well established.  Do not drive if sleepy. Follow up with Corrie Dandy  In 6 months  or before as needed.  Remember  to have mask, tubings, filter, reservoir cleaned at least once a week with soapy water.      Magdalen Spatz, NP 08/15/2016  11:17 AM

## 2016-08-15 NOTE — Therapy (Signed)
[place], [place], [address] Phone: [phone]   Fax:  [phone]  Physical Therapy Treatment  Patient Details  Name: Mark Phillips MRN: [id number] Date of Birth: Apr 25, 1971 Referring Provider: Glenna Fellows  Encounter Date: 08/14/2016      PT End of Session - 08/15/16 1630    Visit Number 8   Number of Visits 9   Date for PT Re-Evaluation 08/16/16   Authorization Type [employer] Managed    Authorization Time Period 16/0/73 to 71/0/62; recert done 69/48   PT Start Time 1040  Pt stated he was late due to traffice from the rain and cold weather   PT Stop Time 1115   PT Time Calculation (min) 35 min   Activity Tolerance Patient tolerated treatment well   Behavior During Therapy [facility] for tasks assessed/performed      Past Medical History:  Diagnosis Date  . Arthritis   . Essential hypertension   . GERD (gastroesophageal reflux disease)   . Hyperlipidemia   . 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 Sleep apnea   . Type 2 diabetes mellitus ([place])     Past Surgical History:  Procedure Laterality Date  . ANKLE RECONSTRUCTION     lt reconstruction and rt scope sx  . APPENDECTOMY  2000  . COLON SURGERY    . LUMBAR DISC SURGERY     July 2017 - Dr. Carloyn Manner  . SHOULDER ARTHROSCOPY WITH SUBACROMIAL DECOMPRESSION AND OPEN ROTATOR C Right 02/25/2013   Procedure: RIGHT SHOULDER ARTHROSCOPY WITH SUBACROMIAL DECOMPRESSION AND MINI OPEN ROTATOR CUFF REPAIR;  Surgeon: Johnn Hai, MD;  Location: [facility];  Service: Orthopedics;  Laterality: Right;  . WISDOM TOOTH EXTRACTION  2011    There were no vitals filed for this visit.      Subjective Assessment - 08/15/16 1621    Subjective Today is so so.  Pt states that he has had a lot of issues with his back since last tx.  States it is nothing to do with the past tx.  He states he woke up with it.  Pt states he is a side sleeper, but always ends up on his back.  Pt  uses pillow in between knees, and under knees.  Discussed drinking water and hydration to reduce muscle cramps.     Pertinent History OSA, HTN, DM, history lumbar surgery July 2017: L5-S1 laminectomy/discectomy, R L3-L4 laminotomy and foraminontomy   How long can you sit comfortably? 10/30- 5-10 minutes before shifting around/leaning to the L    How long can you stand comfortably? 10/30- unlimited    How long can you walk comfortably? 10/30- unlimited    Patient Stated Goals reduce pain, get stronger, get back to work/PLOF    Pain Onset 1 to 4 weeks ago   Pain Onset In the past 7 days                         [facility] Adult PT Treatment/Exercise - 08/15/16 1622      Lumbar Exercises: Stretches   Active Hamstring Stretch 30 seconds;1 rep  bilaterally   Double Knee to Chest Stretch 1 rep;60 seconds   Piriformis Stretch 2 reps;30 seconds  bilaterally     Lumbar Exercises: Supine   Other Supine Lumbar Exercises towel roll stretch x 2 min in supine     Lumbar Exercises: Sidelying   Clam 10 reps  2 sets bilaterally     Lumbar Exercises: Quadruped   Opposite Arm/Leg Raise 10 reps   Other Quadruped Lumbar Exercises Fire hydrant x 10 reps bilaterally.       Manual Therapy   Manual Therapy Soft tissue mobilization   Manual therapy comments Performed completely seperate to all other therapeutic interventions.    Soft tissue mobilization L gluteal, piriformis TPR                PT Education - 08/15/16 1630    Education provided Yes   Education Details Encouraged modifications, and introduced some stretching to reduce pain   Person(s) Educated Patient   Methods Explanation   Comprehension Verbalized understanding;Need further instruction          PT Short Term Goals - 08/15/16 1636      PT SHORT TERM GOAL #1   Title Patient to demonstrate at least a 50% reduction in lumbar muscle knotting in order to reduce pain and improve functional mechanics    Baseline  10/30- patient reports decrease in frequency of symptoms, depends on what/how much he has done through the day    Time 2   Period Weeks   Status Partially Met     PT SHORT TERM GOAL #2   Title Patient to experience pain no more than 1/10 with all functional movements in order to show improvement of condition and assist in progression of return to PLOF    Baseline 82/95- complicated by new onset of acute pain to L side, being investigated by MD    Time 2   Period Weeks   Status On-going     PT SHORT TERM GOAL #3   Title Patient to be independent in correctly and consistently perform appropriate HEP, to be updated PRN    Baseline 10/30- reports compliance except for when he has been sick    Time 1   Period Weeks   Status Achieved           PT Long Term Goals - 08/15/16 1636      PT LONG TERM GOAL #1   Title Patient to demonstrate functional LE and core strength as being 5/5 in order to assist in reducing pain and returning to PLOF based activities    Time 4   Period Weeks   Status Partially Met     PT LONG TERM GOAL #2   Title Patient to demonstrate correct functional lifting techniques with various sizes of boxes/loads in order to ensure safety and facilitate return to work with pain no more than 1/10 in order to assist in return to PLOF    Time 4   Period Weeks   Status Achieved     PT LONG TERM GOAL #3   Title Patient to score no more than 20% limitation on FOTO survey in order to show general improvement of condition    Baseline 10/30- DNT    Time 4   Period Weeks   Status On-going               Plan - 08/15/16 1634    Clinical Impression Statement Pt arrived c/o increased pain today, and therefore has not been able to be consistent with HEP.  Pt continues to demonstrate some LE weakness, and compensatory mechanisms that may be contributing to continued L sided pain.  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  Will re-assess at next visit.    Rehab Potential Good   PT Frequency Other (comment)  1 more session   PT Duration Other (comment)  1 more session   PT Treatment/Interventions ADLs/Self Care Home Management;Biofeedback;Cryotherapy;Moist Heat;Stair training;Functional mobility training;Therapeutic activities;Therapeutic exercise;Balance training;Neuromuscular re-education;Patient/family education;Manual techniques;Scar mobilization;Energy conservation;Taping   PT Next Visit Plan Continued focus on core strength, and lifting.  Review fire hydrants, and maybe add some overhead chops.  Likely DC in 1 more session.     PT Home Exercise Plan 10/4: single leg bridges, wall squats, sidelying hip ABD; 07/15/16: asked to work on diaphragmatic breathing with TrAbd activation at home. 11/1: added forward and modified side planks 11/8: piriformis stretch, and 1/2 foam roll stretch in supine due to increased pain today   Consulted and Agree with Plan of Care Patient      Patient will benefit from skilled therapeutic intervention in order to improve the following deficits and impairments:  Improper body mechanics, Pain, Increased muscle spasms, Decreased activity tolerance, Decreased strength, Decreased safety awareness, Impaired flexibility  Visit Diagnosis: Chronic midline low back pain, with sciatica presence unspecified  Muscle weakness (generalized)  Other symptoms and signs involving the musculoskeletal system     Problem List Patient Active Problem List   Diagnosis Date Noted  . OSA (obstructive sleep apnea) 07/04/2016  . Tobacco user 07/04/2016  . Facial cellulitis 03/15/2014  . Pain in joint, shoulder region 04/08/2013  . Muscle weakness (generalized) 04/08/2013  . Right rotator cuff tear 02/25/2013  . Gout 08/27/2012  . Foot infection 08/27/2012  . Achilles bursitis or tendinitis 08/27/2012  .  Cervicalgia 06/24/2011  . Cervical disc disorder with radiculopathy 06/12/2011    Wells Guiles Lineth Thielke 08/15/2016, 4:37 PM  [place] [address] [place], [place], [address] Phone: [phone]   Fax:  [phone]  Name: Mark Phillips MRN: [id number] Date of Birth: 02/25/1971

## 2016-08-15 NOTE — Patient Instructions (Addendum)
  It is nice to meet you today. Peridex moisturizing mouthwash for dry mouth. Continue on CPAP at bedtime. You appear to be benefiting from the treatment Goal is to wear for at least 4-6 hours each night for maximal clinical benefit. Continue to work on weight loss, as the link between excess weight  and sleep apnea is well established.  Do not drive if sleepy. Follow up with Corrie Dandy  In 6 months  or before as needed.  Remember  to have mask, tubings, filter, reservoir cleaned at least once a week with soapy water.

## 2016-08-15 NOTE — Addendum Note (Signed)
Addended by: Benson Setting L on: 08/15/2016 11:31 AM   Modules accepted: Orders

## 2016-08-15 NOTE — Assessment & Plan Note (Signed)
Tolerating new CPAP machine and mask well. 100% compliance  AHI 0.6 Dry mouth in am despite humidification. Plan: Peridex moisturizing mouthwash for dry mouth. Continue on CPAP at bedtime. You appear to be benefiting from the treatment Goal is to wear for at least 4-6 hours each night for maximal clinical benefit. Continue to work on weight loss, as the link between excess weight  and sleep apnea is well established.  Do not drive if sleepy. Follow up with Mark Phillips  In 6 months  or before as needed.  Remember  to have mask, tubings, filter, reservoir cleaned at least once a week with soapy water.

## 2016-08-16 ENCOUNTER — Ambulatory Visit (HOSPITAL_COMMUNITY): Payer: Managed Care, Other (non HMO) | Admitting: Physical Therapy

## 2016-08-16 DIAGNOSIS — M545 Low back pain: Secondary | ICD-10-CM | POA: Diagnosis not present

## 2016-08-16 DIAGNOSIS — G8929 Other chronic pain: Secondary | ICD-10-CM

## 2016-08-16 DIAGNOSIS — M6281 Muscle weakness (generalized): Secondary | ICD-10-CM

## 2016-08-16 DIAGNOSIS — R29898 Other symptoms and signs involving the musculoskeletal system: Secondary | ICD-10-CM

## 2016-08-16 NOTE — Patient Instructions (Addendum)
Backward Bend (Standing)    Arch backward to make hollow of back deeper. Hold __2__ seconds. Repeat 10____ times per set. Do __1__ sets per session. Do 4 ____ sessions per day.  http://orth.exer.us/178   Copyright  VHI. All rights reserved.  On Elbows (Prone)    Rise up on elbows as high as possible, keeping hips on floor. Hold __30__ seconds. Repeat __3__ times per set. Do _1___ sets per session. Do ___2_ sessions per day.  http://orth.exer.us/92   Copyright  VHI. All rights reserved.  Press-Up    Press upper body upward, keeping hips in contact with floor. Keep lower back and buttocks relaxed. Breathe in as you are pressing up Hold 3____ seconds while exhaling  Repeat _10___ times per set. Do _1___ sets per session. Do ___2_ sessions per day.  http://orth.exer.us/94   Copyright  VHI. All rights reserved.

## 2016-08-16 NOTE — Therapy (Addendum)
[place], [place], [address] Phone: [phone]   Fax:  [phone]  Physical Therapy Treatment  Patient Details  Name: Mark Phillips MRN: [id number] Date of Birth: [birth year] Referring Provider: Glenna Fellows   Encounter Date: 08/16/2016      PT End of Session - 08/16/16 1150    Visit Number 9   Number of Visits 9   Date for PT Re-Evaluation 08/16/16   Authorization Type [employer] Managed    Authorization Time Period 53/6/64 to 40/3/47; recert done 42/59   Authorization - Visit Number 9   Authorization - Number of Visits 9   PT Start Time 1118   PT Stop Time 1200   PT Time Calculation (min) 42 min   Activity Tolerance Patient tolerated treatment well   Behavior During Therapy [facility] for tasks assessed/performed      Past Medical History:  Diagnosis Date  . Arthritis   . Essential hypertension   . GERD (gastroesophageal reflux disease)   . Hyperlipidemia   . 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 Sleep apnea   . Type 2 diabetes mellitus ([place])     Past Surgical History:  Procedure Laterality Date  . ANKLE RECONSTRUCTION     lt reconstruction and rt scope sx  . APPENDECTOMY  2000  . COLON SURGERY    . LUMBAR DISC SURGERY     July 2017 - Dr. Carloyn Manner  . SHOULDER ARTHROSCOPY WITH SUBACROMIAL DECOMPRESSION AND OPEN ROTATOR C Right 02/25/2013   Procedure: RIGHT SHOULDER ARTHROSCOPY WITH SUBACROMIAL DECOMPRESSION AND MINI OPEN ROTATOR CUFF REPAIR;  Surgeon: Johnn Hai, MD;  Location: [facility];  Service: Orthopedics;  Laterality: Right;  . WISDOM TOOTH EXTRACTION  2011    There were no vitals filed for this visit.      Subjective Assessment - 08/16/16 1124    Subjective Pt states that he is doing his exercises he is not walking    Pertinent History OSA, HTN, DM, history lumbar surgery July 2017: L5-S1 laminectomy/discectomy, R L3-L4 laminotomy and foraminontomy   How long can you sit comfortably?  10/30- 5-10 minutes before shifting around/leaning to the L ; 11/10:  no change   How long can you stand comfortably? 10/30- unlimited    How long can you walk comfortably? 10/30- unlimited    Patient Stated Goals reduce pain, get stronger, get back to work/PLOF    Currently in Pain? Yes   Pain Score 2    Pain Location Hip   Pain Orientation Right;Left   Pain Descriptors / Indicators Tender   Pain Type Chronic pain   Pain Onset 1 to 4 weeks ago   Pain Onset In the past 7 days            [facility] PT Assessment - 08/16/16 0001      Assessment   Medical Diagnosis back pain post surgery    Referring Provider Glenna Fellows    Onset Date/Surgical Date --  July 2017, MRI scheduled 08/15/16, MD on Sep 06, 2016   Next MD Visit Dr. 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      Observation/Other Assessments   Focus on  Therapeutic Outcomes (FOTO)  30% limitation     Strength   Right Hip Flexion 5/5   Right Hip ABduction 5/5  was 4+/5    Left Hip Flexion 5/5   Left Hip ABduction 5/5  was 4+/5    Right Knee Flexion 5/5   Right Knee Extension 5/5  5-/5 was 4+/5    Left Knee Flexion 5/5   Left Knee Extension 5/5  was 4+/5   Right Ankle Dorsiflexion 4+/5  remains 4+/5    Left Ankle Dorsiflexion 5/5  was 4+/5                     [facility] Adult PT Treatment/Exercise - 08/16/16 0001      Lumbar Exercises: Stretches   Active Hamstring Stretch 3 reps;30 seconds   Standing Extension 5 reps x2   Prone on Elbows Stretch 5 reps x2   Press Ups 5 reps x 2     Lumbar Exercises: Prone   Straight Leg Raise 10 reps                PT Education - 08/16/16 1142    Education provided Yes    Education Details new HEP; back anatomy, body mechanics.    Person(s) Educated Patient   Methods Explanation   Comprehension Verbalized understanding          PT Short Term Goals - 08/16/16 1153      PT SHORT TERM GOAL #1   Title Patient to demonstrate at least a 50% reduction in lumbar muscle knotting in order to reduce pain and improve functional mechanics    Baseline 10/30- patient reports decrease in frequency of symptoms, depends on what/how much he has done through the day    Time 2   Period Weeks   Status On-going     PT SHORT TERM GOAL #2   Title Patient to experience pain no more than 1/10 with all functional movements in order to show improvement of condition and assist in progression of return to PLOF    Baseline 01/75- complicated by new onset of acute pain to L side, being investigated by MD    Time 2   Period Weeks   Status On-going     PT SHORT TERM GOAL #3   Title Patient to be independent in correctly and consistently perform appropriate HEP, to be updated PRN    Baseline 10/30- reports compliance except for when he has been sick    Time 1   Period Weeks   Status Achieved           PT Long Term Goals - 08/16/16 1154      PT LONG TERM GOAL #1   Title Patient to demonstrate functional LE and core strength as being 5/5 in order to assist in reducing pain and returning to PLOF based activities    Time 4   Period Weeks   Status On-going     PT LONG TERM GOAL #2   Title Patient to demonstrate correct functional lifting techniques with various sizes of boxes/loads in order to ensure safety and facilitate return to work with pain no more than 1/10 in order to assist in return to PLOF    Time 4   Period Weeks   Status Achieved     PT LONG TERM GOAL #3   Title Patient to score no more than 20% limitation on FOTO survey in order to show general improvement of condition    Baseline  70= 30% limitation    Time 4   Period Weeks   Status Partially Met                Plan - 08/16/16 1206    Clinical Impression Statement Pt seen for reassessment.  All mm grades are 5- to 5/5.  Pt educated on back biomechanics and the importance of putting extension into his life.  Therapist encouraged pt to stop smoking and to begin a walking program as these two items have been linked to back health.  Pt verbalized understanding.  Pt feels that he is ready to be discharged.  Therapist feels pt could benefit from more bodymechanic training but pt feels that his work supplies this information.    Rehab Potential Good   PT Frequency Other (comment)  1 more session   PT Duration Other (comment)  1 more session   PT Treatment/Interventions ADLs/Self Care Home Management;Biofeedback;Cryotherapy;Moist Heat;Stair training;Functional mobility training;Therapeutic activities;Therapeutic exercise;Balance training;Neuromuscular re-education;Patient/family education;Manual techniques;Scar mobilization;Energy conservation;Taping   PT Next Visit Plan Discharge to HEP    PT Home Exercise Plan 10/4: single leg bridges, wall squats, sidelying hip ABD; 07/15/16: asked to work on diaphragmatic breathing with TrAbd activation at home. 11/1: added forward and modified side planks 11/8: piriformis stretch, and 1/2 foam roll stretch in supine due to increased pain today   Consulted and Agree with Plan of Care Patient      Patient will benefit from skilled therapeutic intervention in order to improve the following deficits and impairments:  Improper body mechanics, Pain, Increased muscle spasms, Decreased activity tolerance, Decreased strength, Decreased safety awareness, Impaired flexibility  Visit Diagnosis: Chronic midline low back pain, with sciatica presence unspecified  Muscle weakness (generalized)  Other symptoms and signs involving the musculoskeletal system     Problem List Patient Active Problem List   Diagnosis Date Noted  . OSA (obstructive sleep apnea)  07/04/2016  . Tobacco user 07/04/2016  . Facial cellulitis 03/15/2014  . Pain in joint, shoulder region 04/08/2013  . Muscle weakness (generalized) 04/08/2013  . Right rotator cuff tear 02/25/2013  . Gout 08/27/2012  . Foot infection 08/27/2012  . Achilles bursitis or tendinitis 08/27/2012  . Cervicalgia 06/24/2011  . Cervical disc disorder with radiculopathy 06/12/2011    Rayetta Humphrey, PT CLT [phone] 08/16/2016, 12:10 PM  [place] [address] [place], [place], [address] Phone: [phone]   Fax:  [phone]  Name: Mark Phillips MRN: [id number] Date of Birth: 1971/05/19  PHYSICAL THERAPY DISCHARGE SUMMARY  Visits from Start of Care: 9  Current functional level related to goals / functional outcomes: See above   Remaining deficits: See above   Education / Equipment: Posture, body mechanics  Plan: Patient agrees to discharge.  Patient goals were partially met. Patient is being discharged due to being pleased with the current functional level.  ?????        Rayetta Humphrey, [place] CLT [phone]

## 2016-08-19 ENCOUNTER — Telehealth: Payer: Self-pay

## 2016-08-19 NOTE — Telephone Encounter (Signed)
Pt called office and said he received a letter in the mail that he had a new pt appt 08/14/16 but he didn't get the letter until 08/16/16. Scheduled new appt for 08/28/16 at 9:30am.

## 2016-08-28 ENCOUNTER — Encounter: Payer: Self-pay | Admitting: Nurse Practitioner

## 2016-08-28 ENCOUNTER — Other Ambulatory Visit: Payer: Self-pay

## 2016-08-28 ENCOUNTER — Ambulatory Visit (INDEPENDENT_AMBULATORY_CARE_PROVIDER_SITE_OTHER): Payer: Managed Care, Other (non HMO) | Admitting: Nurse Practitioner

## 2016-08-28 DIAGNOSIS — R1084 Generalized abdominal pain: Secondary | ICD-10-CM | POA: Diagnosis not present

## 2016-08-28 DIAGNOSIS — R195 Other fecal abnormalities: Secondary | ICD-10-CM | POA: Diagnosis not present

## 2016-08-28 DIAGNOSIS — K625 Hemorrhage of anus and rectum: Secondary | ICD-10-CM | POA: Insufficient documentation

## 2016-08-28 DIAGNOSIS — R109 Unspecified abdominal pain: Secondary | ICD-10-CM | POA: Insufficient documentation

## 2016-08-28 DIAGNOSIS — K921 Melena: Secondary | ICD-10-CM

## 2016-08-28 MED ORDER — OMEPRAZOLE 20 MG PO CPDR: 20.0000 mg | DELAYED_RELEASE_CAPSULE | Freq: Every day | ORAL | 2 refills | Status: DC

## 2016-08-28 MED ORDER — DICYCLOMINE HCL 10 MG PO CAPS: 10.0000 mg | ORAL_CAPSULE | Freq: Three times a day (TID) | ORAL | 0 refills | Status: DC

## 2016-08-28 MED ORDER — NA SULFATE-K SULFATE-MG SULF 17.5-3.13-1.6 GM/177ML PO SOLN: 1.0000 | ORAL | 0 refills | Status: DC

## 2016-08-28 NOTE — Assessment & Plan Note (Signed)
The patient notes intermittent rectal bleeding of bright red blood. Does have a history of hemorrhoids. Likely benign anorectal source, although cannot rule out more insidious pathology. He is 45 and will soon be due for colonoscopy. He does have some component of family history, although doubtful primary relative family history of colon cancer. Given hematochezia and his GI presentation including colon resection in 2000 for ruptured colon of unknown etiology, abdominal pain, looser stools, exacerbation of diarrhea with deemed likely gastroenteritis we will proceed with colonoscopy to further evaluate. This will serve as his first-ever colonoscopy. Return for follow-up in 2 months.  Proceed with TCS on propofol/MAC with Dr. Gala Romney in near future: the risks, benefits, and alternatives have been discussed with the patient in detail. The patient states understanding and desires to proceed.  The patient is not on any anticoagulants. He is currently on Neurontin and oxycodone for chronic pain. He is younger at age 70. Due to polypharmacy and age we will proceed with the procedure on propofol/MAC.

## 2016-08-28 NOTE — Assessment & Plan Note (Signed)
The patient describes abdominal pain worse after eating, no improvement with bowel movement. Has a history of GERD, not currently on a PPI. 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 I will start him on omeprazole 20 mg once a day to see if this helps his symptoms. Other management as per above

## 2016-08-28 NOTE — Progress Notes (Signed)
Primary Care Physician:  Doree Albee, MD Primary Gastroenterologist:  Dr. Gala Romney  Chief Complaint  Patient presents with  . Abdominal Pain    x 2 1/2 months, worse after eating and in the evening, mid abd area  . Diarrhea    at times  . Emesis    few times    HPI:   Mark Phillips is a 45 y.o. male who presents on referral from primary care for abdominal pain. PCP notes reviewed.  Review of our records indicate he was seen in the emergency department 08/01/2016 and diagnosed with gastroenteritis. At that time he complained of diarrhea for about 2 weeks with intermittent diffuse abdominal crampy pain and occasional emesis. Attempted Pepto-Bismol. Also with some fatigue. The patient was diagnosed with gastroenteritis, plan for Imodium for diarrhea, avoid dairy.  Today he states he's doing ok overall. Is having mid to lower abdominal pain for about 2-2.5 months. Has had a partial colon resection for ruptured colon. Has worsening pain after eating, typically worse with evening meals versus morning/day meals. Pain is dull, intermittent. Feels like he has excessive gas and bloating. Pain doesn't improve with bowel movement. Also with nausea and occasional vomiting, typically occurs when he has the bloating/"overeating" symptoms. Denies hematemesis. Occasional brbpr but has a history of hemorrhoids. Had a history of black stools but was taking Pepto Bismol. Has about 6 bowel movements when he's having watery stools, otherwise will have 1-2 bowel movements. Typically has to go to the bathroom soon after eating. Denies chest pain, dyspnea, dizziness, lightheadedness, syncope, near syncope. Denies any other upper or lower GI symptoms.  Has never had a colonoscopy or EGD before.  Past Medical History:  Diagnosis Date  . Arthritis   . Essential hypertension   . GERD (gastroesophageal reflux disease)   . Hyperlipidemia   . 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 Sleep apnea   .  Type 2 diabetes mellitus ([place])     Past Surgical History:  Procedure Laterality Date  . ANKLE RECONSTRUCTION     lt reconstruction and rt scope sx  . APPENDECTOMY  2000  . COLON SURGERY  02/1999  . LUMBAR DISC SURGERY     July 2017 - Dr. Carloyn Manner  . SHOULDER ARTHROSCOPY WITH SUBACROMIAL DECOMPRESSION AND OPEN ROTATOR C Right 02/25/2013   Procedure: RIGHT SHOULDER ARTHROSCOPY WITH SUBACROMIAL DECOMPRESSION AND MINI OPEN ROTATOR CUFF REPAIR;  Surgeon: Johnn Hai, MD;  Location: [facility];  Service: Orthopedics;  Laterality: Right;  . WISDOM TOOTH EXTRACTION  2011    Current Outpatient Prescriptions  Medication Sig Dispense Refill  . atorvastatin (LIPITOR) 80 MG tablet Take 80 mg by mouth at bedtime.    . baclofen (LIORESAL) 10 MG tablet Take 10 mg by mouth 3 (three) times daily.    . chlorhexidine (PERIDEX) 0.12 % solution Use as directed 15 mLs in the mouth or throat daily as needed. 473 mL 0  . Cholecalciferol (VITAMIN D3) 5000 units TABS Take 1 tablet by mouth daily.    [name] gabapentin (NEURONTIN) 300 MG capsule Take 300 mg by mouth 3 (three) times daily.    [name] GLIPIZIDE XL 5 MG 24 hr tablet Take 5 mg by mouth daily.     [name] lisinopril (PRINIVIL,ZESTRIL) 10 MG tablet Take 10 mg by mouth daily.    . metFORMIN (GLUCOPHAGE) 500 MG tablet Take 500 mg by mouth 2 (two) times daily with a meal.     .  oxyCODONE-acetaminophen (PERCOCET/ROXICET) 5-325 MG tablet Take 1 tablet by mouth every 6 (six) hours as needed for moderate pain.      No current facility-administered medications for this visit.     Allergies as of 08/28/2016 - Review Complete 08/28/2016  Allergen Reaction Noted  . Albuterol Swelling 06/05/2011    Family History  Problem Relation Age of Onset  . Heart attack Mother   . Hypertension Mother   . Diabetes Mellitus II Mother   . COPD Father   . Colon cancer Maternal Grandmother   . Colon cancer Other     Social History   Social History  . Marital status: Married    Spouse  name: N/A  . Number of children: N/A  . Years of education: N/A   Occupational History  . Not on file.   Social History Main Topics  . Smoking status: Current Every Day Smoker    Packs/day: 1.00    Years: 19.00    Types: Cigarettes    Start date: 06/13/1993  . Smokeless tobacco: Former Systems developer    Types: Chew    Quit date: 10/07/1993  . Alcohol use No  . Drug use: No  . Sexual activity: Not on file   Other Topics Concern  . Not on file   Social History Narrative  . No narrative on file    Review of Systems: General: Negative for anorexia, weight loss, fever, chills, fatigue, weakness. ENT: Negative for hoarseness, nasal congestion. CV: Negative for chest pain, angina, palpitations, peripheral edema.  Respiratory: Negative for dyspnea at rest, cough, sputum, wheezing.  GI: See history of present illness. GU:  Negative for dysuria, hematuria, urinary incontinence, urinary frequency, nocturnal urination.  MS: Chronic pain.  Derm: Negative for rash or itching.  Endo: Negative for unusual weight change.  Heme: Negative for bruising or bleeding.    Physical Exam: BP 131/83   Pulse 70   Temp 97.7 F (36.5 C) (Oral)   Ht 5\' 11"  (1.803 m)   Wt 204 lb 12.8 oz (92.9 kg)   BMI 28.56 kg/m  General:   Alert and oriented. Pleasant and cooperative. Well-nourished and well-developed.  Head:  Normocephalic and atraumatic. Eyes:  Without icterus, sclera clear and conjunctiva pink.  Ears:  Normal auditory acuity. Cardiovascular:  S1, S2 present without murmurs appreciated. Extremities without clubbing or edema. Respiratory:  Clear to auscultation bilaterally. No wheezes, rales, or rhonchi. No distress.  Gastrointestinal:  +BS, soft, non-tender and non-distended. No HSM noted. No guarding or rebound. No masses appreciated.  Rectal:  Deferred  Musculoskalatal:  Symmetrical without gross deformities. Neurologic:  Alert and oriented x4;  grossly normal neurologically. Psych:  Alert and  cooperative. Normal mood and affect. Heme/Lymph/Immune: No excessive bruising noted.    08/28/2016 10:17 AM   Disclaimer: This note was dictated with voice recognition software. Similar sounding words can inadvertently be transcribed and may not be corrected upon review.

## 2016-08-28 NOTE — Assessment & Plan Note (Signed)
The patient has a baseline of soft/loose stools ever since colon resection due to colon rupture in 2000. He did have a worsening of his stools to diarrhea for which she was seen in the emergency department and deemed likely gastroenteritis. He seems to be back to baseline at this point when specifically asked. Baseline loose stools likely due to less: Surface area and decreased absorption. 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 I will start him on a probiotic for one month. I will also start him on Bentyl 10 mg 3-4 times a day as needed for abdominal cramping and diarrhea. Colonoscopy as noted below. Return for follow-up in 2 months.

## 2016-08-28 NOTE — Progress Notes (Signed)
CC'ED TO PCP 

## 2016-08-28 NOTE — Patient Instructions (Signed)
1. I have sent an Bentyl 10 mg to your pharmacy. You take this 3 times a day with meals and at bedtime, as needed for loose stools and/or abdominal cramping. 2. Take a probiotic per the instructions on the box for one month. I am providing you with coupons to help with the cost. 3. I sent in omeprazole 20 mg to your pharmacy. Take this once a day, 30 minutes before your first meal the day. 4. We'll schedule your procedure for you. 5. Further recommendations to be based on the results of your procedure. 6. Return for follow-up in 2 months.

## 2016-09-12 NOTE — Patient Instructions (Signed)
Mark Phillips  09/12/2016     @PREFPERIOPPHARMACY @   Your procedure is scheduled on 09/19/2016.  Report to [facility] at 8:00 A.M.  Call this number if you have problems the morning of surgery:  [phone]   Remember:  Do not eat food or drink liquids after midnight.  Take these medicines the morning of surgery with A SIP OF WATER Baclofen, Bentyl, Gabapentin, Lisinopril, Prilosec, Oxycodone   Do not wear jewelry, make-up or nail polish.  Do not wear lotions, powders, or perfumes, or deoderant.  Do not shave 48 hours prior to surgery.  Men may shave face and neck.  Do not bring valuables to the hospital.  [facility] is not responsible for any belongings or valuables.  Contacts, dentures or bridgework may not be worn into surgery.  Leave your suitcase in the car.  After surgery it may be brought to your room.  For patients admitted to the hospital, discharge time will be determined by your treatment team.  Patients discharged the day of surgery will not be allowed to drive home.   POST-ANESTHESIA  IMMEDIATELY FOLLOWING SURGERY:  Do not drive or operate machinery for the first twenty four hours after surgery.  Do not make any important decisions for twenty four hours after surgery or while taking narcotic pain medications or sedatives.  If you develop intractable nausea and vomiting or a severe headache please notify your doctor immediately.  FOLLOW-UP:  Please make an appointment with your surgeon as instructed. You do not need to follow up with anesthesia unless specifically instructed to do so.  WOUND CARE INSTRUCTIONS (if applicable):  Keep a dry clean dressing on the anesthesia/puncture wound site if there is drainage.  Once the wound has quit draining you may leave it open to air.  Generally you should leave the bandage intact for twenty four hours unless there is drainage.  If the epidural site drains for more than 36-48 hours please call the anesthesia  department.  QUESTIONS?:  Please feel free to call your physician or the hospital operator if you have any questions, and they will be happy to assist you.      Colonoscopy, Adult A colonoscopy is an exam to look at the entire large intestine. During the exam, a lubricated, bendable tube is inserted into the anus and then passed into the rectum, colon, and other parts of the large intestine. A colonoscopy is often done as a part of normal colorectal screening or in response to certain symptoms, such as anemia, persistent diarrhea, abdominal pain, and blood in the stool. The exam can help screen for and diagnose medical problems, including:  Tumors.  Polyps.  Inflammation.  Areas of bleeding. Tell a health care provider about:  Any allergies you have.  All medicines you are taking, including vitamins, herbs, eye drops, creams, and over-the-counter medicines.  Any problems you or family members have had with anesthetic medicines.  Any blood disorders you have.  Any surgeries you have had.  Any medical conditions you have.  Any problems you have had passing stool. What are the risks? Generally, this is a safe procedure. However, problems may occur, including:  Bleeding.  A tear in the intestine.  A reaction to medicines given during the exam.  Infection (rare). What happens before the procedure? Eating and drinking restrictions  Follow instructions from your health care provider about eating and drinking, which may include:  A few days before the procedure - follow a low-fiber diet.  Avoid nuts, seeds, dried fruit, raw fruits, and vegetables.  1-3 days before the procedure - follow a clear liquid diet. Drink only clear liquids, such as clear broth or bouillon, black coffee or tea, clear juice, clear soft drinks or sports drinks, gelatin desert, and popsicles. Avoid any liquids that contain red or purple dye.  On the day of the procedure - do not eat or drink anything  during the 2 hours before the procedure, or within the time period that your health care provider recommends. Bowel prep  If you were prescribed an oral bowel prep to clean out your colon:  Take it as told by your health care provider. Starting the day before your procedure, you will need to drink a large amount of medicated liquid. The liquid will cause you to have multiple loose stools until your stool is almost clear or light green.  If your skin or anus gets irritated from diarrhea, you may use these to relieve the irritation:  Medicated wipes, such as adult wet wipes with aloe and vitamin E.  A skin soothing-product like petroleum jelly.  If you vomit while drinking the bowel prep, take a break for up to 60 minutes and then begin the bowel prep again. If vomiting continues and you cannot take the bowel prep without vomiting, call your health care provider. General instructions  Ask your health care provider about changing or stopping your regular medicines. This is especially important if you are taking diabetes medicines or blood thinners.  Plan to have someone take you home from the hospital or clinic. What happens during the procedure?  An IV tube may be inserted into one of your veins.  You will be given medicine to help you relax (sedative).  To reduce your risk of infection:  Your health care team will wash or sanitize their hands.  Your anal area will be washed with soap.  You will be asked to lie on your side with your knees bent.  Your health care provider will lubricate a long, thin, flexible tube. The tube will have a camera and a light on the end.  The tube will be inserted into your anus.  The tube will be gently eased through your rectum and colon.  Air will be delivered into your colon to keep it open. You may feel some pressure or cramping.  The camera will be used to take images during the procedure.  A small tissue sample may be removed from your body  to be examined under a microscope (biopsy). If any potential problems are found, the tissue will be sent to a lab for testing.  If small polyps are found, your health care provider may remove them and have them checked for cancer cells.  The tube that was inserted into your anus will be slowly removed. The procedure may vary among health care providers and hospitals. What happens after the procedure?  Your blood pressure, heart rate, breathing rate, and blood oxygen level will be monitored until the medicines you were given have worn off.  Do not drive for 24 hours after the exam.  You may have a small amount of blood in your stool.  You may pass gas and have mild abdominal cramping or bloating due to the air that was used to inflate your colon during the exam.  It is up to you to get the results of your procedure. Ask your health care provider, or the department performing the procedure, when your results will be  ready. This information is not intended to replace advice given to you by your health care provider. Make sure you discuss any questions you have with your health care provider. Document Released: 09/20/2000 Document Revised: 04/12/2016 Document Reviewed: 12/05/2015 Elsevier Interactive Patient Education  2017 [employer].

## 2016-09-13 ENCOUNTER — Encounter (HOSPITAL_COMMUNITY): Payer: Self-pay

## 2016-09-13 ENCOUNTER — Encounter (HOSPITAL_COMMUNITY)
Admission: RE | Admit: 2016-09-13 | Discharge: 2016-09-13 | Disposition: A | Discharge status: Home / Self Care | Payer: Managed Care, Other (non HMO) | Source: Ambulatory Visit | Attending: Internal Medicine | Admitting: Internal Medicine

## 2016-09-13 DIAGNOSIS — Z01812 Encounter for preprocedural laboratory examination: Secondary | ICD-10-CM | POA: Insufficient documentation

## 2016-09-13 HISTORY — DX: Personal history of urinary calculi: Z87.442

## 2016-09-13 LAB — BASIC METABOLIC PANEL
Anion gap: 8 (ref 5–15)
BUN: 7 mg/dL (ref 6–20)
CHLORIDE: 103 mmol/L (ref 101–111)
CO2: 22 mmol/L (ref 22–32)
CREATININE: 0.72 mg/dL (ref 0.61–1.24)
Calcium: 9.4 mg/dL (ref 8.9–10.3)
Glucose, Bld: 360 mg/dL — ABNORMAL HIGH (ref 65–99)
POTASSIUM: 3.9 mmol/L (ref 3.5–5.1)
SODIUM: 133 mmol/L — AB (ref 135–145)

## 2016-09-13 LAB — CBC
HCT: 44.2 % (ref 39.0–52.0)
Hemoglobin: 15.6 g/dL (ref 13.0–17.0)
MCH: 31.2 pg (ref 26.0–34.0)
MCHC: 35.3 g/dL (ref 30.0–36.0)
MCV: 88.4 fL (ref 78.0–100.0)
PLATELETS: 166 10*3/uL (ref 150–400)
RBC: 5 MIL/uL (ref 4.22–5.81)
RDW: 12.8 % (ref 11.5–15.5)
WBC: 12.1 10*3/uL — ABNORMAL HIGH (ref 4.0–10.5)

## 2016-09-18 ENCOUNTER — Other Ambulatory Visit: Payer: Self-pay | Admitting: Nurse Practitioner

## 2016-09-18 DIAGNOSIS — K625 Hemorrhage of anus and rectum: Secondary | ICD-10-CM

## 2016-09-18 DIAGNOSIS — R1084 Generalized abdominal pain: Secondary | ICD-10-CM

## 2016-09-18 DIAGNOSIS — R195 Other fecal abnormalities: Secondary | ICD-10-CM

## 2016-09-19 ENCOUNTER — Encounter (HOSPITAL_COMMUNITY)
Admission: RE | Disposition: A | Discharge status: Home Health | Payer: Self-pay | Source: Ambulatory Visit | Attending: Internal Medicine

## 2016-09-19 ENCOUNTER — Ambulatory Visit (HOSPITAL_COMMUNITY)
Admission: RE | Admit: 2016-09-19 | Discharge: 2016-09-19 | Disposition: A | Discharge status: Home Health | Payer: Managed Care, Other (non HMO) | Source: Ambulatory Visit | Attending: Internal Medicine | Admitting: Internal Medicine

## 2016-09-19 ENCOUNTER — Ambulatory Visit (HOSPITAL_COMMUNITY): Payer: Managed Care, Other (non HMO) | Admitting: Anesthesiology

## 2016-09-19 ENCOUNTER — Encounter (HOSPITAL_COMMUNITY): Payer: Self-pay | Admitting: *Deleted

## 2016-09-19 DIAGNOSIS — G4730 Sleep apnea, unspecified: Secondary | ICD-10-CM | POA: Insufficient documentation

## 2016-09-19 DIAGNOSIS — K640 First degree hemorrhoids: Secondary | ICD-10-CM | POA: Insufficient documentation

## 2016-09-19 DIAGNOSIS — D126 Benign neoplasm of colon, unspecified: Secondary | ICD-10-CM

## 2016-09-19 DIAGNOSIS — Z7984 Long term (current) use of oral hypoglycemic drugs: Secondary | ICD-10-CM | POA: Diagnosis not present

## 2016-09-19 DIAGNOSIS — K219 Gastro-esophageal reflux disease without esophagitis: Secondary | ICD-10-CM | POA: Diagnosis not present

## 2016-09-19 DIAGNOSIS — R195 Other fecal abnormalities: Secondary | ICD-10-CM

## 2016-09-19 DIAGNOSIS — R1032 Left lower quadrant pain: Secondary | ICD-10-CM

## 2016-09-19 DIAGNOSIS — F17210 Nicotine dependence, cigarettes, uncomplicated: Secondary | ICD-10-CM | POA: Diagnosis not present

## 2016-09-19 DIAGNOSIS — K635 Polyp of colon: Secondary | ICD-10-CM | POA: Insufficient documentation

## 2016-09-19 DIAGNOSIS — I10 Essential (primary) hypertension: Secondary | ICD-10-CM | POA: Diagnosis not present

## 2016-09-19 DIAGNOSIS — E1165 Type 2 diabetes mellitus with hyperglycemia: Secondary | ICD-10-CM | POA: Insufficient documentation

## 2016-09-19 DIAGNOSIS — Z79899 Other long term (current) drug therapy: Secondary | ICD-10-CM | POA: Diagnosis not present

## 2016-09-19 DIAGNOSIS — E785 Hyperlipidemia, unspecified: Secondary | ICD-10-CM | POA: Diagnosis not present

## 2016-09-19 HISTORY — PX: COLONOSCOPY WITH PROPOFOL: SHX5780

## 2016-09-19 HISTORY — PX: BIOPSY: SHX5522

## 2016-09-19 LAB — GLUCOSE, CAPILLARY
GLUCOSE-CAPILLARY: 279 mg/dL — AB (ref 65–99)
Glucose-Capillary: 316 mg/dL — ABNORMAL HIGH (ref 65–99)
Glucose-Capillary: 225 mg/dL — ABNORMAL HIGH (ref 65–99)

## 2016-09-19 SURGERY — COLONOSCOPY WITH PROPOFOL
Anesthesia: Monitor Anesthesia Care

## 2016-09-19 MED ORDER — LIDOCAINE HCL (CARDIAC) 10 MG/ML IV SOLN
INTRAVENOUS | Status: DC | PRN
  Administered 2016-09-19: 50 mg via INTRAVENOUS

## 2016-09-19 MED ORDER — LACTATED RINGERS IV SOLN
INTRAVENOUS | Status: DC
  Administered 2016-09-19: 09:00:00 via INTRAVENOUS

## 2016-09-19 MED ORDER — FENTANYL CITRATE (PF) 100 MCG/2ML IJ SOLN
25.0000 ug | INTRAMUSCULAR | Status: AC | PRN
  Administered 2016-09-19 (×2): 25 ug via INTRAVENOUS

## 2016-09-19 MED ORDER — ONDANSETRON HCL 4 MG/2ML IJ SOLN
INTRAMUSCULAR | Status: AC
  Filled 2016-09-19: qty 2

## 2016-09-19 MED ORDER — MIDAZOLAM HCL 2 MG/2ML IJ SOLN
INTRAMUSCULAR | Status: AC
  Filled 2016-09-19: qty 2

## 2016-09-19 MED ORDER — INSULIN ASPART 100 UNIT/ML ~~LOC~~ SOLN
4.0000 [IU] | Freq: Once | SUBCUTANEOUS | Status: AC
  Administered 2016-09-19: 4 [IU] via SUBCUTANEOUS
  Filled 2016-09-19: qty 0.04

## 2016-09-19 MED ORDER — MIDAZOLAM HCL 2 MG/2ML IJ SOLN
1.0000 mg | INTRAMUSCULAR | Status: DC | PRN
  Administered 2016-09-19 (×2): 2 mg via INTRAVENOUS
  Filled 2016-09-19: qty 2

## 2016-09-19 MED ORDER — PROPOFOL 500 MG/50ML IV EMUL
INTRAVENOUS | Status: DC | PRN
  Administered 2016-09-19: 100 ug/kg/min via INTRAVENOUS
  Administered 2016-09-19: 125 ug/kg/min via INTRAVENOUS

## 2016-09-19 MED ORDER — FENTANYL CITRATE (PF) 100 MCG/2ML IJ SOLN
INTRAMUSCULAR | Status: AC
  Filled 2016-09-19: qty 2

## 2016-09-19 MED ORDER — PROPOFOL 10 MG/ML IV BOLUS
INTRAVENOUS | Status: DC | PRN
  Administered 2016-09-19 (×3): 20 mg via INTRAVENOUS

## 2016-09-19 MED ORDER — ONDANSETRON HCL 4 MG/2ML IJ SOLN
4.0000 mg | Freq: Once | INTRAMUSCULAR | Status: AC
  Administered 2016-09-19: 4 mg via INTRAVENOUS

## 2016-09-19 NOTE — Transfer of Care (Signed)
Immediate Anesthesia Transfer of Care Note  Patient: Mark Phillips  Procedure(s) Performed: Procedure(s) with comments: COLONOSCOPY WITH PROPOFOL (N/A) - 930  BIOPSY - colon  Patient Location: PACU  Anesthesia Type:MAC  Level of Consciousness: patient cooperative  Airway & Oxygen Therapy: Patient Spontanous Breathing and non-rebreather face mask  Post-op Assessment: Report given to RN and Post -op Vital signs reviewed and stable  Post vital signs: Reviewed and stable  Last Vitals:  Vitals:   09/19/16 0935 09/19/16 0940  BP: 110/70   Pulse:    Resp: (!) 38 19  Temp:      Last Pain:  Vitals:   09/19/16 0810  TempSrc: Oral      Patients Stated Pain Goal: 5 ([id number])  Complications: No apparent anesthesia complications

## 2016-09-19 NOTE — Anesthesia Procedure Notes (Addendum)
Procedure Name: MAC Date/Time: 09/19/2016 9:59 AM Performed by: Vista Deck Pre-anesthesia Checklist: Patient identified, Emergency Drugs available, Suction available, Timeout performed and Patient being monitored Patient Re-evaluated:Patient Re-evaluated prior to inductionOxygen Delivery Method: Nasal Cannula

## 2016-09-19 NOTE — Op Note (Signed)
[facility] Patient Name: Mark Phillips Procedure Date: 09/19/2016 9:17 AM MRN: [id number] Date of Birth: 20-Aug-1971 Attending MD: Norvel Richards , MD CSN: [id number] Age: 45 Admit Type: Outpatient Procedure:                Colonoscopy with biopsy Indications:              Abdominal pain in the left lower quadrant, Change                            in stool caliber Providers:                Norvel Richards, MD, Otis Peak B. Sharon Seller, RN,                            Isabella Stalling, Technician Referring MD:             Doree Albee MD, MD Medicines:                Propofol per Anesthesia Complications:            No immediate complications. Estimated Blood Loss:     Estimated blood loss was minimal. Procedure:                Pre-Anesthesia Assessment:                           - Prior to the procedure, a History and Physical                            was performed, and patient medications and                            allergies were reviewed. The patient's tolerance of                            previous anesthesia was also reviewed. The risks                            and benefits of the procedure and the sedation                            options and risks were discussed with the patient.                            All questions were answered, and informed consent                            was obtained. Prior Anticoagulants: The patient has                            taken no previous anticoagulant or antiplatelet                            agents. ASA Grade Assessment: III - A patient with  severe systemic disease. After reviewing the risks                            and benefits, the patient was deemed in                            satisfactory condition to undergo the procedure.                           After obtaining informed consent, the colonoscope                            was passed under direct vision. Throughout the                       procedure, the patient's blood pressure, pulse, and                            oxygen saturations were monitored continuously. The                            [id number] scope was introduced through                            the and advanced to the the ileocolonic                            anastomosis. The terminal ileum and the rectum were                            photographed. The entire colon was well visualized.                            The quality of the bowel preparation was adequate. Scope In: 10:10:43 AM Scope Out: 10:24:25 AM Scope Withdrawal Time: 0 hours 10 minutes 0 seconds  Total Procedure Duration: 0 hours 13 minutes 42 seconds  Findings:      The perianal and digital rectal examinations were normal.      A 5 mm, non-bleeding polypoid lesion was found in the anastomosis.       Suture material protr of this area. The polyp was sessile. This was       biopsied with a cold forceps for histology. Estimated blood loss was       minimal.      The exam was otherwise without abnormality on direct and retroflexion       views.      Non-bleeding internal hemorrhoids were found during retroflexion. The       hemorrhoids were Grade I (internal hemorrhoids that do not prolapse). Impression:               - One 5 mm, non-bleeding polypoid area at the                            anastomosis. Biopsied.                           -  The examination was otherwise normal on direct                            and retroflexion views. Moderate Sedation:      Moderate (conscious) sedation was personally administered by an       anesthesia professional. The following parameters were monitored: oxygen       saturation, heart rate, blood pressure, respiratory rate, EKG, adequacy       of pulmonary ventilation, and response to care. Total physician       intraservice time was 23 minutes. Recommendation:           - Patient has a contact number available for                             emergencies. The signs and symptoms of potential                            delayed complications were discussed with the                            patient. Return to normal activities tomorrow.                            Written discharge instructions were provided to the                            patient.                           - Resume previous diet.                           - Continue present medications.                           - Repeat colonoscopy date to be determined after                            pending pathology results are reviewed for                            surveillance based on pathology results.                           - Return to GI office in 6 weeks. Procedure Code(s):        --- Professional ---                           [phone], Colonoscopy, flexible; with biopsy, single                            or multiple Diagnosis Code(s):        --- Professional ---                           D12.6, Benign neoplasm of colon, unspecified  R10.32, Left lower quadrant pain                           R19.5, Other fecal abnormalities CPT copyright 2016 American Medical Association. All rights reserved. The codes documented in this report are preliminary and upon coder review may  be revised to meet current compliance requirements. Cristopher Estimable. Sue Mcalexander, MD Norvel Richards, MD 09/19/2016 10:36:07 AM This report has been signed electronically. Number of Addenda: 0

## 2016-09-19 NOTE — Anesthesia Preprocedure Evaluation (Signed)
Anesthesia Evaluation  Patient identified by MRN, date of birth, ID band Patient awake    Reviewed: Allergy & Precautions, NPO status , Patient's Chart, lab work & pertinent test results  History of Anesthesia Complications (+) PONV and history of anesthetic complications  Airway Mallampati: II  TM Distance: >3 FB Neck ROM: Full    Dental  (+) Teeth Intact   Pulmonary sleep apnea , Current Smoker,    breath sounds clear to auscultation       Cardiovascular hypertension, Pt. on medications  Rhythm:Regular Rate:Normal     Neuro/Psych Lumbar HNP     GI/Hepatic GERD  ,  Endo/Other  diabetes, Poorly Controlled, Type 2, Oral Hypoglycemic Agents  Renal/GU      Musculoskeletal  (+) Arthritis ,   Abdominal   Peds  Hematology   Anesthesia Other Findings   Reproductive/Obstetrics                             Anesthesia Physical Anesthesia Plan  ASA: III  Anesthesia Plan: MAC   Post-op Pain Management:    Induction: Intravenous  Airway Management Planned: Simple Face Mask  Additional Equipment:   Intra-op Plan:   Post-operative Plan:   Informed Consent: I have reviewed the patients History and Physical, chart, labs and discussed the procedure including the risks, benefits and alternatives for the proposed anesthesia with the patient or authorized representative who has indicated his/her understanding and acceptance.     Plan Discussed with: CRNA and Surgeon  Anesthesia Plan Comments: (4u novolog for CBG=316)        Anesthesia Quick Evaluation

## 2016-09-19 NOTE — H&P (View-Only) (Signed)
Primary Care Physician:  Doree Albee, MD Primary Gastroenterologist:  Dr. Gala Romney  Chief Complaint  Patient presents with  . Abdominal Pain    x 2 1/2 months, worse after eating and in the evening, mid abd area  . Diarrhea    at times  . Emesis    few times    HPI:   Mark Phillips is a 45 y.o. male who presents on referral from primary care for abdominal pain. PCP notes reviewed.  Review of our records indicate he was seen in the emergency department 08/01/2016 and diagnosed with gastroenteritis. At that time he complained of diarrhea for about 2 weeks with intermittent diffuse abdominal crampy pain and occasional emesis. Attempted Pepto-Bismol. Also with some fatigue. The patient was diagnosed with gastroenteritis, plan for Imodium for diarrhea, avoid dairy.  Today he states he's doing ok overall. Is having mid to lower abdominal pain for about 2-2.5 months. Has had a partial colon resection for ruptured colon. Has worsening pain after eating, typically worse with evening meals versus morning/day meals. Pain is dull, intermittent. Feels like he has excessive gas and bloating. Pain doesn't improve with bowel movement. Also with nausea and occasional vomiting, typically occurs when he has the bloating/"overeating" symptoms. Denies hematemesis. Occasional brbpr but has a history of hemorrhoids. Had a history of black stools but was taking Pepto Bismol. Has about 6 bowel movements when he's having watery stools, otherwise will have 1-2 bowel movements. Typically has to go to the bathroom soon after eating. Denies chest pain, dyspnea, dizziness, lightheadedness, syncope, near syncope. Denies any other upper or lower GI symptoms.  Has never had a colonoscopy or EGD before.  Past Medical History:  Diagnosis Date  . Arthritis   . Essential hypertension   . GERD (gastroesophageal reflux disease)   . Hyperlipidemia   . 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 Sleep apnea   .  Type 2 diabetes mellitus ([place])     Past Surgical History:  Procedure Laterality Date  . ANKLE RECONSTRUCTION     lt reconstruction and rt scope sx  . APPENDECTOMY  2000  . COLON SURGERY  02/1999  . LUMBAR DISC SURGERY     July 2017 - Dr. Carloyn Manner  . SHOULDER ARTHROSCOPY WITH SUBACROMIAL DECOMPRESSION AND OPEN ROTATOR C Right 02/25/2013   Procedure: RIGHT SHOULDER ARTHROSCOPY WITH SUBACROMIAL DECOMPRESSION AND MINI OPEN ROTATOR CUFF REPAIR;  Surgeon: Johnn Hai, MD;  Location: [facility];  Service: Orthopedics;  Laterality: Right;  . WISDOM TOOTH EXTRACTION  2011    Current Outpatient Prescriptions  Medication Sig Dispense Refill  . atorvastatin (LIPITOR) 80 MG tablet Take 80 mg by mouth at bedtime.    . baclofen (LIORESAL) 10 MG tablet Take 10 mg by mouth 3 (three) times daily.    . chlorhexidine (PERIDEX) 0.12 % solution Use as directed 15 mLs in the mouth or throat daily as needed. 473 mL 0  . Cholecalciferol (VITAMIN D3) 5000 units TABS Take 1 tablet by mouth daily.    [name] gabapentin (NEURONTIN) 300 MG capsule Take 300 mg by mouth 3 (three) times daily.    [name] GLIPIZIDE XL 5 MG 24 hr tablet Take 5 mg by mouth daily.     [name] lisinopril (PRINIVIL,ZESTRIL) 10 MG tablet Take 10 mg by mouth daily.    . metFORMIN (GLUCOPHAGE) 500 MG tablet Take 500 mg by mouth 2 (two) times daily with a meal.     .  oxyCODONE-acetaminophen (PERCOCET/ROXICET) 5-325 MG tablet Take 1 tablet by mouth every 6 (six) hours as needed for moderate pain.      No current facility-administered medications for this visit.     Allergies as of 08/28/2016 - Review Complete 08/28/2016  Allergen Reaction Noted  . Albuterol Swelling 06/05/2011    Family History  Problem Relation Age of Onset  . Heart attack Mother   . Hypertension Mother   . Diabetes Mellitus II Mother   . COPD Father   . Colon cancer Maternal Grandmother   . Colon cancer Other     Social History   Social History  . Marital status: Married    Spouse  name: N/A  . Number of children: N/A  . Years of education: N/A   Occupational History  . Not on file.   Social History Main Topics  . Smoking status: Current Every Day Smoker    Packs/day: 1.00    Years: 19.00    Types: Cigarettes    Start date: 06/13/1993  . Smokeless tobacco: Former Systems developer    Types: Chew    Quit date: 10/07/1993  . Alcohol use No  . Drug use: No  . Sexual activity: Not on file   Other Topics Concern  . Not on file   Social History Narrative  . No narrative on file    Review of Systems: General: Negative for anorexia, weight loss, fever, chills, fatigue, weakness. ENT: Negative for hoarseness, nasal congestion. CV: Negative for chest pain, angina, palpitations, peripheral edema.  Respiratory: Negative for dyspnea at rest, cough, sputum, wheezing.  GI: See history of present illness. GU:  Negative for dysuria, hematuria, urinary incontinence, urinary frequency, nocturnal urination.  MS: Chronic pain.  Derm: Negative for rash or itching.  Endo: Negative for unusual weight change.  Heme: Negative for bruising or bleeding.    Physical Exam: BP 131/83   Pulse 70   Temp 97.7 F (36.5 C) (Oral)   Ht 5\' 11"  (1.803 m)   Wt 204 lb 12.8 oz (92.9 kg)   BMI 28.56 kg/m  General:   Alert and oriented. Pleasant and cooperative. Well-nourished and well-developed.  Head:  Normocephalic and atraumatic. Eyes:  Without icterus, sclera clear and conjunctiva pink.  Ears:  Normal auditory acuity. Cardiovascular:  S1, S2 present without murmurs appreciated. Extremities without clubbing or edema. Respiratory:  Clear to auscultation bilaterally. No wheezes, rales, or rhonchi. No distress.  Gastrointestinal:  +BS, soft, non-tender and non-distended. No HSM noted. No guarding or rebound. No masses appreciated.  Rectal:  Deferred  Musculoskalatal:  Symmetrical without gross deformities. Neurologic:  Alert and oriented x4;  grossly normal neurologically. Psych:  Alert and  cooperative. Normal mood and affect. Heme/Lymph/Immune: No excessive bruising noted.    08/28/2016 10:17 AM   Disclaimer: This note was dictated with voice recognition software. Similar sounding words can inadvertently be transcribed and may not be corrected upon review.

## 2016-09-19 NOTE — Anesthesia Postprocedure Evaluation (Signed)
Anesthesia Post Note  Patient: Mark Phillips  Procedure(s) Performed: Procedure(s) (LRB): COLONOSCOPY WITH PROPOFOL (N/A) BIOPSY  Patient location during evaluation: PACU Anesthesia Type: MAC Level of consciousness: awake and patient cooperative Pain management: pain level controlled Vital Signs Assessment: post-procedure vital signs reviewed and stable Respiratory status: spontaneous breathing and non-rebreather facemask Cardiovascular status: stable Anesthetic complications: no    Last Vitals:  Vitals:   09/19/16 0940 09/19/16 1035  BP:  101/62  Pulse:  87  Resp: 19 (!) 26  Temp:  36.6 C    Last Pain:  Vitals:   09/19/16 0810  TempSrc: Oral                 Tenae Graziosi

## 2016-09-19 NOTE — Discharge Instructions (Addendum)
°  Colonoscopy Discharge Instructions  Read the instructions outlined below and refer to this sheet in the next few weeks. These discharge instructions provide you with general information on caring for yourself after you leave the hospital. Your doctor may also give you specific instructions. While your treatment has been planned according to the most current medical practices available, unavoidable complications occasionally occur. If you have any problems or questions after discharge, call Dr. Gala Romney at [phone]. ACTIVITY  You may resume your regular activity, but move at a slower pace for the next 24 hours.   Take frequent rest periods for the next 24 hours.   Walking will help get rid of the air and reduce the bloated feeling in your belly (abdomen).   No driving for 24 hours (because of the medicine (anesthesia) used during the test).    Do not sign any important legal documents or operate any machinery for 24 hours (because of the anesthesia used during the test).  NUTRITION  Drink plenty of fluids.   You may resume your normal diet as instructed by your doctor.   Begin with a light meal and progress to your normal diet. Heavy or fried foods are harder to digest and may make you feel sick to your stomach (nauseated).   Avoid alcoholic beverages for 24 hours or as instructed.  MEDICATIONS  You may resume your normal medications unless your doctor tells you otherwise.  WHAT YOU CAN EXPECT TODAY  Some feelings of bloating in the abdomen.   Passage of more gas than usual.   Spotting of blood in your stool or on the toilet paper.  IF YOU HAD POLYPS REMOVED DURING THE COLONOSCOPY:  No aspirin products for 7 days or as instructed.   No alcohol for 7 days or as instructed.   Eat a soft diet for the next 24 hours.  FINDING OUT THE RESULTS OF YOUR TEST Not all test results are available during your visit. If your test results are not back during the visit, make an appointment  with your caregiver to find out the results. Do not assume everything is normal if you have not heard from your caregiver or the medical facility. It is important for you to follow up on all of your test results.  SEEK IMMEDIATE MEDICAL ATTENTION IF:  You have more than a spotting of blood in your stool.   Your belly is swollen (abdominal distention).   You are nauseated or vomiting.   You have a temperature over 101.   You have abdominal pain or discomfort that is severe or gets worse throughout the day.    Further recommendations to follow pending review of pathology report  See Dr. Anastasio Champion regarding management of poorly controlled blood sugars.

## 2016-09-19 NOTE — Interval H&P Note (Signed)
History and Physical Interval Note:  09/19/2016 9:49 AM  Mark Phillips  has presented today for surgery, with the diagnosis of blood in stool/abd pain  The various methods of treatment have been discussed with the patient and family. After consideration of risks, benefits and other options for treatment, the patient has consented to  Procedure(s) with comments: COLONOSCOPY WITH PROPOFOL (N/A) - 930  as a surgical intervention .  The patient's history has been reviewed, patient examined, no change in status, stable for surgery.  I have reviewed the patient's chart and labs.  Questions were answered to the patient's satisfaction.     Mark Phillips  No change. Diagnostic colonoscopy per plan.The risks, benefits, limitations, alternatives and imponderables have been reviewed with the patient. Questions have been answered. All parties are agreeable.

## 2016-09-24 ENCOUNTER — Encounter (HOSPITAL_COMMUNITY): Payer: Self-pay | Admitting: Internal Medicine

## 2016-09-27 ENCOUNTER — Encounter: Payer: Self-pay | Admitting: Internal Medicine

## 2016-10-03 ENCOUNTER — Telehealth: Payer: Self-pay

## 2016-10-03 NOTE — Telephone Encounter (Signed)
Per RMR-  Send letter to patient.  Send copy of letter with path to referring provider and PCP.   These office visit in 6-8 weeks in reference to abdominal pain

## 2016-10-03 NOTE — Telephone Encounter (Signed)
Letter mailed to the pt. Please schedule ov. 

## 2016-10-03 NOTE — Telephone Encounter (Signed)
APPT MADE AND ON RECALL  °

## 2016-10-11 ENCOUNTER — Other Ambulatory Visit: Payer: Self-pay | Admitting: Nurse Practitioner

## 2016-10-11 DIAGNOSIS — R195 Other fecal abnormalities: Secondary | ICD-10-CM

## 2016-10-11 DIAGNOSIS — R1084 Generalized abdominal pain: Secondary | ICD-10-CM

## 2016-10-11 DIAGNOSIS — K625 Hemorrhage of anus and rectum: Secondary | ICD-10-CM

## 2016-10-28 ENCOUNTER — Ambulatory Visit (INDEPENDENT_AMBULATORY_CARE_PROVIDER_SITE_OTHER): Payer: Managed Care, Other (non HMO) | Admitting: Nurse Practitioner

## 2016-10-28 ENCOUNTER — Encounter: Payer: Self-pay | Admitting: Nurse Practitioner

## 2016-10-28 VITALS — BP 135/87 | HR 70 | Temp 98.3°F | Ht 71.0 in | Wt 201.2 lb

## 2016-10-28 DIAGNOSIS — R195 Other fecal abnormalities: Secondary | ICD-10-CM | POA: Diagnosis not present

## 2016-10-28 DIAGNOSIS — R1084 Generalized abdominal pain: Secondary | ICD-10-CM | POA: Diagnosis not present

## 2016-10-28 NOTE — Progress Notes (Signed)
Referring Provider: Doree Albee, MD Primary Care Physician:  Doree Albee, MD Primary GI:  Dr. Gala Romney  Chief Complaint  Patient presents with  . Abdominal Pain  . Diarrhea    HPI:   Mark Phillips is a 46 y.o. male who presents for follow-up on abdominal pain and loose stools. The patient was last seen in our office 08/28/2016 for the same in addition to bright red blood per rectum. At the time of her last visit it was noted she had recently been diagnosed with gastroenteritis. At her last visit she was doing well overall but noted persistent mid to lower abdominal pain for the previous 2-3 months. History of colon resection. No improvement with bowel movement, occasional nausea and vomiting with bloating/"overeating" symptoms. History of hemorrhoids. Black stools on Pepto-Bismol. We'll fluctuate between loose stools and normal bowel movements with 6 stools a day when having watery stools otherwise 1-2 a day with normal stools. Fecal urgency. She was given Bentyl 10 mg 3 times a day as needed and at bedtime as needed, probiotic, omeprazole, and she was arranged for colonoscopy.  Colonoscopy completed on 09/19/2016 which found a 5 mm nonbleeding polypoid lesion at her anastomosis, sessile status post biopsy. Surgical pathology as per below. Recommended 10 year repeat screening colonoscopy.  Today she states he's doing ok overall. His abdominal pain is a little bit better currently, overall; about a 25% improvement. States eating is hard "I cant eat like I used to." He notes increased pain after eating and is unable to eat as much "about a third of a plate" and then done; notes fullness/bloating and discomfort. Occasional reflux although this happens more when he hasn't eaten. Is still having loose stools. This has only been for the past 3-4 months but after colectomy didn't pretty well. Watery stools "most of the time" and occasionally consistent with [place] 6; never solid/formed.  Denies hematochezia, melena. Has not taken Prilosec in the past 2-3 weeks due to not working and unable to afford. Has been taking Bentyl 3 times a day and seems to be helping some. Recently has GI bug around the first of the year which his whole family had. Still has his gallbladder. Denies chest pain, dyspnea, dizziness, lightheadedness, syncope, near syncope. Denies any other upper or lower GI symptoms.        Past Medical History:  Diagnosis Date  . Arthritis   . Essential hypertension   . GERD (gastroesophageal reflux disease)   . History of kidney stones   . Hyperlipidemia   . 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 PONV (postoperative nausea and vomiting)   . Sleep apnea   . Type 2 diabetes mellitus ([place])     Past Surgical History:  Procedure Laterality Date  . ANKLE RECONSTRUCTION Left    lt reconstruction and rt scope sx  . APPENDECTOMY  2000  . BIOPSY  09/19/2016   Procedure: BIOPSY;  Surgeon: Daneil Dolin, MD;  Location: [facility];  Service: Endoscopy;;  colon  . COLON SURGERY  02/1999   removal of 8 inches due to perforation.  . COLONOSCOPY WITH PROPOFOL N/A 09/19/2016   Procedure: COLONOSCOPY WITH PROPOFOL;  Surgeon: Daneil Dolin, MD;  Location: [facility];  Service: Endoscopy;  Laterality: N/A;  930   . LUMBAR DISC SURGERY     July 2017 - Dr. Carloyn Manner  . SHOULDER ARTHROSCOPY WITH SUBACROMIAL DECOMPRESSION AND OPEN ROTATOR C Right 02/25/2013  Procedure: RIGHT SHOULDER ARTHROSCOPY WITH SUBACROMIAL DECOMPRESSION AND MINI OPEN ROTATOR CUFF REPAIR;  Surgeon: Johnn Hai, MD;  Location: [facility];  Service: Orthopedics;  Laterality: Right;  . WISDOM TOOTH EXTRACTION  2011    Current Outpatient Prescriptions  Medication Sig Dispense Refill  . atorvastatin (LIPITOR) 80 MG tablet Take 80 mg by mouth at bedtime.    . baclofen (LIORESAL) 10 MG tablet Take 10 mg by mouth 3 (three) times daily as needed for muscle spasms.     . chlorhexidine (PERIDEX)  0.12 % solution Use as directed 15 mLs in the mouth or throat daily as needed. 473 mL 0  . Cholecalciferol (VITAMIN D3) 5000 units TABS Take 1 tablet by mouth daily.    [name] dicyclomine (BENTYL) 10 MG capsule TAKE ONE CAPSULE BY MOUTH FOUR TIMES A DAY BEFORE MEALS AND AT BEDTIME 90 capsule 0  . gabapentin (NEURONTIN) 300 MG capsule Take 900 mg by mouth at bedtime.     [name] GLIPIZIDE XL 5 MG 24 hr tablet Take 5 mg by mouth daily.     [name] ibuprofen (ADVIL,MOTRIN) 200 MG tablet Take 600 mg by mouth every 6 (six) hours as needed for headache.    . lisinopril (PRINIVIL,ZESTRIL) 5 MG tablet Take 5 mg by mouth daily.     . metFORMIN (GLUCOPHAGE) 500 MG tablet Take 500 mg by mouth 2 (two) times daily with a meal.     . omeprazole (PRILOSEC) 20 MG capsule Take 1 capsule (20 mg total) by mouth daily. 30 capsule 2  . oxyCODONE-acetaminophen (PERCOCET/ROXICET) 5-325 MG tablet Take 1 tablet by mouth every 6 (six) hours as needed for moderate pain.      No current facility-administered medications for this visit.     Allergies as of 10/28/2016 - Review Complete 10/28/2016  Allergen Reaction Noted  . Albuterol Swelling 06/05/2011    Family History  Problem Relation Age of Onset  . Heart attack Mother   . Hypertension Mother   . Diabetes Mellitus II Mother   . COPD Father   . Colon cancer Maternal Grandmother   . Colon cancer Other     Social History   Social History  . Marital status: Married    Spouse name: N/A  . Number of children: N/A  . Years of education: N/A   Social History Main Topics  . Smoking status: Current Every Day Smoker    Packs/day: 1.00    Years: 19.00    Types: Cigarettes    Start date: 06/13/1993  . Smokeless tobacco: Former Systems developer    Types: Chew    Quit date: 10/07/1993  . Alcohol use No  . Drug use: No  . Sexual activity: Yes    Birth control/ protection: None   Other Topics Concern  . Not on file   Social History Narrative  . No narrative on file    Review of  Systems: General: Negative for anorexia, weight loss, fever, chills, fatigue, weakness. ENT: Negative for hoarseness, difficulty swallowing. CV: Negative for chest pain, angina, palpitations, peripheral edema.  Respiratory: Negative for dyspnea at rest, cough, sputum, wheezing.  GI: See history of present illness. Endo: Negative for unusual weight change.  Heme: Negative for bruising or bleeding. Allergy: Negative for rash or hives.   Physical Exam: BP 135/87   Pulse 70   Temp 98.3 F (36.8 C) (Oral)   Ht 5\' 11"  (1.803 m)   Wt 201 lb 3.2 oz (91.3 kg)   BMI 28.06 kg/m  General:   Alert and oriented. Pleasant and cooperative. Well-nourished and well-developed.  Eyes:  Without icterus, sclera clear and conjunctiva pink.  Ears:  Normal auditory acuity. Cardiovascular:  S1, S2 present without murmurs appreciated. Extremities without clubbing or edema. Respiratory:  Clear to auscultation bilaterally. No wheezes, rales, or rhonchi. No distress.  Gastrointestinal:  +BS, soft, non-tender and non-distended. No HSM noted. No guarding or rebound. No masses appreciated. Lower abdominal midline surgical scar noted. Rectal:  Deferred  Musculoskalatal:  Symmetrical without gross deformities.  Neurologic:  Alert and oriented x4;  grossly normal neurologically. Psych:  Alert and cooperative. Normal mood and affect. Heme/Lymph/Immune: No excessive bruising noted.    10/28/2016 9:45 AM   Disclaimer: This note was dictated with voice recognition software. Similar sounding words can inadvertently be transcribed and may not be corrected upon review.

## 2016-10-28 NOTE — Patient Instructions (Addendum)
1. When you collect her stool studies, bring those to the lab not our office. 2. Diet changes as per below. 3. I am giving the samples of a probiotic to take for a month to see if this helps given the stomach bug that has recently hit your household. 4. Return for follow-up in 4-6 weeks     Gastroparesis Introduction Gastroparesis, also called delayed gastric emptying, is a condition in which food takes longer than normal to empty from the stomach. The condition is usually long-lasting (chronic). What are the causes? This condition may be caused by:  An endocrine disorder, such as hypothyroidism or diabetes. Diabetes is the most common cause of this condition.  A nervous system disease, such as Parkinson disease or multiple sclerosis.  Cancer, infection, or surgery of the stomach or vagus nerve.  A connective tissue disorder, such as scleroderma.  Certain medicines. In most cases, the cause is not known. What increases the risk? This condition is more likely to develop in:  People with certain disorders, including endocrine disorders, eating disorders, amyloidosis, and scleroderma.  People with certain diseases, including Parkinson disease or multiple sclerosis.  People with cancer or infection of the stomach or vagus nerve.  People who have had surgery on the stomach or vagus nerve.  People who take certain medicines.  Women. What are the signs or symptoms? Symptoms of this condition include:  An early feeling of fullness when eating.  Nausea.  Weight loss.  Vomiting.  Heartburn.  Abdominal bloating.  Inconsistent blood glucose levels.  Lack of appetite.  Acid from the stomach coming up into the esophagus (gastroesophageal reflux).  Spasms of the stomach. Symptoms may come and go. How is this diagnosed? This condition is diagnosed with tests, such as:  Tests that check how long it takes food to move through the stomach and intestines. These tests  include:  Upper gastrointestinal (GI) series. In this test, X-rays of the intestines are taken after you drink a liquid. The liquid makes the intestines show up better on the X-rays.  Gastric emptying scintigraphy. In this test, scans are taken after you eat food that contains a small amount of radioactive material.  Wireless capsule GI monitoring system. This test involves swallowing a capsule that records information about movement through the stomach.  Gastric manometry. This test measures electrical and muscular activity in the stomach. It is done with a thin tube that is passed down the throat and into the stomach.  Endoscopy. This test checks for abnormalities in the lining of the stomach. It is done with a long, thin tube that is passed down the throat and into the stomach.  An ultrasound. This test can help rule out gallbladder disease or pancreatitis as a cause of your symptoms. It uses sound waves to take pictures of the inside of your body. How is this treated? There is no cure for gastroparesis. This condition may be managed with:  Treatment of the underlying condition causing the gastroparesis.  Lifestyle changes, including exercise and dietary changes. Dietary changes can include:  Changes in what and when you eat.  Eating smaller meals more often.  Eating low-fat foods.  Eating low-fiber forms of high-fiber foods, such as cooked vegetables instead of raw vegetables.  Having liquid foods in place of solid foods. Liquid foods are easier to digest.  Medicines. These may be given to control nausea and vomiting and to stimulate stomach muscles.  Getting food through a feeding tube. This may be done in  severe cases.  A gastric neurostimulator. This is a device that is inserted into the body with surgery. It helps improve stomach emptying and control nausea and vomiting. Follow these instructions at home:  Follow your health care provider's instructions about exercise and  diet.  Take medicines only as directed by your health care provider. Contact a health care provider if:  Your symptoms do not improve with treatment.  You have new symptoms. Get help right away if:  You have severe abdominal pain that does not improve with treatment.  You have nausea that does not go away.  You cannot keep fluids down. This information is not intended to replace advice given to you by your health care provider. Make sure you discuss any questions you have with your health care provider. Document Released: 09/23/2005 Document Revised: 02/29/2016 Document Reviewed: 09/19/2014  2017 Elsevier

## 2016-10-28 NOTE — Addendum Note (Signed)
Addended by: Gordy Levan, ERIC A on: 10/28/2016 10:06 AM   Modules accepted: Orders

## 2016-10-28 NOTE — Progress Notes (Signed)
CC'ED TO PCP 

## 2016-10-28 NOTE — Assessment & Plan Note (Signed)
The patient has noted abdominal pain described as a sensation of overeating, discomfort, bloating. He is diabetic. He could have gastroparesis. I discussed the utility of gastric emptying study and that there is not likely a good medication for gastroparesis if it is positive. At this point, given the fact he is been out of work for some time and just started back to work, we will proceed with recommended diet changes with presumed gastroparesis to see if this helps. Return for follow-up in 4-6 weeks.

## 2016-10-28 NOTE — Assessment & Plan Note (Signed)
Persistent loose stools described as "watery" most of the time and occasionally insistent with [place] 6. Never formed/solid. He did recently have another bout of gastroenteritis which ran through his whole family. At this point he could just have unlikely back-to-back occurrences of viral gastroenteritis. However, given his symptoms have been ongoing for 2-3 months I will have stool studies completed to make sure there is no underlying infection. Bentyl has helped about 25%. We can consider other medications for possible IBS if no infection, such as Viberzi. I will need to check for contraindications related to colon resection first. Can also consider probiotics samples. Return for follow-up in 4-6 weeks.

## 2016-12-03 ENCOUNTER — Ambulatory Visit: Payer: Managed Care, Other (non HMO) | Admitting: Nurse Practitioner

## 2016-12-03 ENCOUNTER — Telehealth: Payer: Self-pay | Admitting: Nurse Practitioner

## 2016-12-03 ENCOUNTER — Encounter: Payer: Self-pay | Admitting: Nurse Practitioner

## 2016-12-03 NOTE — Telephone Encounter (Signed)
PT WAS A NO SHOW AND LETTER SENT  °

## 2016-12-03 NOTE — Telephone Encounter (Signed)
Noted  

## 2016-12-29 ENCOUNTER — Other Ambulatory Visit: Payer: Self-pay | Admitting: Nurse Practitioner

## 2016-12-29 DIAGNOSIS — R1084 Generalized abdominal pain: Secondary | ICD-10-CM

## 2016-12-29 DIAGNOSIS — K625 Hemorrhage of anus and rectum: Secondary | ICD-10-CM

## 2016-12-29 DIAGNOSIS — R195 Other fecal abnormalities: Secondary | ICD-10-CM

## 2017-07-02 IMAGING — NM NM MYOCAR MULTI W/SPECT W/WALL MOTION & EF
2 series · 12 of 12 positions shown · non-contrast
Comparison: none

[Series 1: rest · 8.28mm/px · 6 of 64 frames shown]
[frame 6/64]
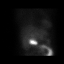
[frame 16/64]
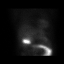
[frame 27/64]
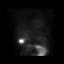
[frame 38/64]
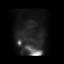
[frame 48/64]
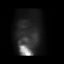
[frame 59/64]
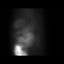

[Series 2: stress gated · 8.28mm/px · 6 of 64 frames shown]
[frame 6/64]
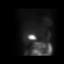
[frame 16/64]
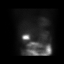
[frame 27/64]
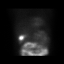
[frame 38/64]
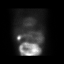
[frame 48/64]
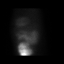
[frame 59/64]
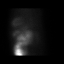

[12 of 12 positions shown; findings below may reference images not displayed]

Canned report from images found in remote index.

Refer to host system for actual result text.

## 2018-03-24 ENCOUNTER — Encounter: Payer: Self-pay | Admitting: Physician Assistant

## 2018-03-24 ENCOUNTER — Ambulatory Visit: Payer: Self-pay | Admitting: Physician Assistant

## 2018-03-24 VITALS — BP 139/84 | HR 70 | Temp 97.9°F | Ht 70.75 in | Wt 193.0 lb

## 2018-03-24 DIAGNOSIS — Z7689 Persons encountering health services in other specified circumstances: Secondary | ICD-10-CM

## 2018-03-24 DIAGNOSIS — F17210 Nicotine dependence, cigarettes, uncomplicated: Secondary | ICD-10-CM

## 2018-03-24 DIAGNOSIS — E1165 Type 2 diabetes mellitus with hyperglycemia: Secondary | ICD-10-CM

## 2018-03-24 DIAGNOSIS — M549 Dorsalgia, unspecified: Secondary | ICD-10-CM

## 2018-03-24 DIAGNOSIS — K0889 Other specified disorders of teeth and supporting structures: Secondary | ICD-10-CM

## 2018-03-24 DIAGNOSIS — I10 Essential (primary) hypertension: Secondary | ICD-10-CM

## 2018-03-24 DIAGNOSIS — E785 Hyperlipidemia, unspecified: Secondary | ICD-10-CM

## 2018-03-24 DIAGNOSIS — G4733 Obstructive sleep apnea (adult) (pediatric): Secondary | ICD-10-CM

## 2018-03-24 DIAGNOSIS — G8929 Other chronic pain: Secondary | ICD-10-CM

## 2018-03-24 MED ORDER — LISINOPRIL 5 MG PO TABS: 5.0000 mg | ORAL_TABLET | Freq: Every day | ORAL | 1 refills | Status: DC

## 2018-03-24 MED ORDER — GLIPIZIDE XL 5 MG PO TB24: 5.0000 mg | ORAL_TABLET | Freq: Every day | ORAL | 0 refills | Status: DC

## 2018-03-24 MED ORDER — METFORMIN HCL 500 MG PO TABS: 500.0000 mg | ORAL_TABLET | Freq: Two times a day (BID) | ORAL | 1 refills | Status: DC

## 2018-03-24 MED ORDER — AMOXICILLIN 500 MG PO CAPS: 500.0000 mg | ORAL_CAPSULE | Freq: Three times a day (TID) | ORAL | 0 refills | Status: DC

## 2018-03-24 NOTE — Progress Notes (Signed)
BP 139/84 (BP Location: Right Arm, Patient Position: Sitting, Cuff Size: Normal)   Pulse 70   Temp 97.9 F (36.6 C)   Ht 5' 10.75" (1.797 m)   Wt 193 lb (87.5 kg)   SpO2 97%   BMI 27.11 kg/m    Subjective:    Patient ID: Mark Phillips, adult    DOB: 08/29/1971, 47 y.o.   MRN: [id number]  HPI: Mark Phillips   HPI  Chief Complaint  Patient presents with  . New Patient (Initial Visit)    Mark last saw PCP 2018  . Dental Phillips    for about a month    Mark says last a1c  Was "5 something".    Mark hasn't seed dr Anastasio Champion since early 2018.  Mark had stopped taking his meds and recently restarted them. Mark doesn't take them regularly but just here and there.   Mark has never had DKA.  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  Mark quit and hasn't found replacement job.   Mark has OSA and uses CPAP  Mark takes the gabapentin for chronic pain of the back  Relevant past medical, surgical, family and social history reviewed and updated as indicated. Interim medical history since our last visit reviewed. Allergies and medications reviewed and updated.   Current Outpatient Medications:  .  atorvastatin (LIPITOR) 80 MG tablet, Take 80 mg by mouth at bedtime., Disp: , Rfl:  .  gabapentin (NEURONTIN) 300 MG capsule, Take 900 mg by mouth at bedtime. 900mg  qhs and 300mg  qam, Disp: , Rfl:  .  GLIPIZIDE XL 5 MG 24 hr tablet, Take 5 mg by mouth daily. , Disp: , Rfl:  .  ibuprofen (ADVIL,MOTRIN) 200 MG tablet, Take 600 mg by mouth every 6 (six) hours as needed for headache., Disp: , Rfl:  .  lisinopril (PRINIVIL,ZESTRIL) 5 MG tablet, Take 5 mg by mouth daily. , Disp: , Rfl:  .  metFORMIN (GLUCOPHAGE) 500 MG tablet, Take 500 mg by mouth 2 (two) times daily with a meal. , Disp: , Rfl:    Review of Systems    Constitutional: Negative for appetite change, chills, diaphoresis, fatigue, fever and unexpected weight change.  HENT: Positive for congestion and dental Phillips. Negative for drooling, ear pain, facial swelling, hearing loss, mouth sores, sneezing, sore throat, trouble swallowing and voice change.   Eyes: Negative for pain, discharge, redness, itching and visual disturbance.  Respiratory: Negative for cough, choking, shortness of breath and wheezing.   Cardiovascular: Negative for chest pain, palpitations and leg swelling.  Gastrointestinal: Negative for abdominal pain, blood in stool, constipation, diarrhea and vomiting.  Endocrine: Negative for cold intolerance, heat intolerance and polydipsia.  Genitourinary: Negative for decreased urine volume, dysuria and hematuria.  Musculoskeletal: Negative for arthralgias, back pain and gait Phillips.  Skin: Negative for rash.  Allergic/Immunologic: Negative for environmental allergies.  Neurological: Negative for seizures, syncope, light-headedness and headaches.  Hematological: Negative for adenopathy.  Psychiatric/Behavioral: Negative for agitation, dysphoric mood and suicidal ideas. The patient is not nervous/anxious.     Per HPI unless specifically indicated above     Objective:    BP 139/84 (BP Location: Right Arm, Patient Position: Sitting, Cuff Size: Normal)   Pulse 70   Temp 97.9 F (36.6 C)  Ht 5' 10.75" (1.797 m)   Wt 193 lb (87.5 kg)   SpO2 97%   BMI 27.11 kg/m   Wt Readings from Last 3 Encounters:  03/24/18 193 lb (87.5 kg)  10/28/16 201 lb 3.2 oz (91.3 kg)  09/13/16 205 lb (93 kg)    Physical Exam  Constitutional: Mark is oriented to person, place, and time. Mark appears well-developed and well-nourished.  HENT:  Head: Normocephalic and atraumatic.  Mouth/Throat: Oropharynx is clear and moist. No trismus in the jaw. Dental caries present. No dental abscesses or uvula swelling. No oropharyngeal exudate.    Eyes: Pupils  are equal, round, and reactive to light. Conjunctivae and EOM are normal.  Neck: Neck supple. No thyromegaly present.  Cardiovascular: Normal rate and regular rhythm.  Pulmonary/Chest: Effort normal and breath sounds normal. Mark has no wheezes. Mark has no rales.  Abdominal: Soft. Bowel sounds are normal. Mark exhibits no mass. There is no hepatosplenomegaly. There is no tenderness.  Musculoskeletal: Mark exhibits no edema.  Lymphadenopathy:    Mark has no cervical adenopathy.  Neurological: Mark is alert and oriented to person, place, and time.  Skin: Skin is warm and dry. No rash noted.  Psychiatric: Mark has a normal mood and affect. His behavior is normal. Thought content normal.  Vitals reviewed.       Assessment & Plan:    Encounter Diagnoses  Name Primary?  . Encounter to establish care Yes  . Uncontrolled type 2 diabetes mellitus with hyperglycemia ([place])   . Essential hypertension   . Hyperlipidemia, unspecified hyperlipidemia type   . Cigarette nicotine dependence without complication   . Chronic back pain, unspecified back location, unspecified back pain laterality   . OSA (obstructive sleep apnea)   . Dentalgia       -Mark to get baseline labs -will put Mark on dental list -Mark to continue current meds today until review labs -amoxil for tooth -Mark will Follow up 1 mo

## 2018-03-27 ENCOUNTER — Other Ambulatory Visit (HOSPITAL_COMMUNITY)
Admission: RE | Admit: 2018-03-27 | Discharge: 2018-03-27 | Disposition: A | Discharge status: Home / Self Care | Payer: Managed Care, Other (non HMO) | Source: Ambulatory Visit | Attending: Physician Assistant | Admitting: Physician Assistant

## 2018-03-27 DIAGNOSIS — E1165 Type 2 diabetes mellitus with hyperglycemia: Secondary | ICD-10-CM | POA: Insufficient documentation

## 2018-03-27 DIAGNOSIS — E785 Hyperlipidemia, unspecified: Secondary | ICD-10-CM | POA: Insufficient documentation

## 2018-03-27 DIAGNOSIS — I10 Essential (primary) hypertension: Secondary | ICD-10-CM

## 2018-03-27 LAB — HEMOGLOBIN A1C
Hgb A1c MFr Bld: 10.7 % — ABNORMAL HIGH (ref 4.8–5.6)
Mean Plasma Glucose: 260.39 mg/dL

## 2018-03-27 LAB — LIPID PANEL
CHOL/HDL RATIO: 6.9 ratio
CHOLESTEROL: 194 mg/dL (ref 0–200)
HDL: 28 mg/dL — ABNORMAL LOW (ref >40)
LDL Cholesterol: 108 mg/dL — ABNORMAL HIGH (ref 0–99)
Triglycerides: 288 mg/dL — ABNORMAL HIGH (ref <150)
VLDL: 58 mg/dL — AB (ref 0–40)

## 2018-03-27 LAB — COMPREHENSIVE METABOLIC PANEL
ALBUMIN: 4.4 g/dL (ref 3.5–5.0)
ALT: 21 U/L (ref 17–63)
ANION GAP: 8 (ref 5–15)
AST: 16 U/L (ref 15–41)
Alkaline Phosphatase: 61 U/L (ref 38–126)
BILIRUBIN TOTAL: 0.7 mg/dL (ref 0.3–1.2)
BUN: 11 mg/dL (ref 6–20)
CHLORIDE: 101 mmol/L (ref 101–111)
CO2: 26 mmol/L (ref 22–32)
Calcium: 9.6 mg/dL (ref 8.9–10.3)
Creatinine, Ser: 0.65 mg/dL (ref 0.61–1.24)
GFR calc Af Amer: >60 mL/min (ref >60)
GFR calc non Af Amer: >60 mL/min (ref >60)
GLUCOSE: 246 mg/dL — AB (ref 65–99)
POTASSIUM: 4.8 mmol/L (ref 3.5–5.1)
Sodium: 135 mmol/L (ref 135–145)
TOTAL PROTEIN: 7.9 g/dL (ref 6.5–8.1)

## 2018-03-28 LAB — MICROALBUMIN, URINE: Microalb, Ur: 28.5 ug/mL — ABNORMAL HIGH

## 2018-04-23 ENCOUNTER — Ambulatory Visit: Payer: Self-pay | Admitting: Physician Assistant

## 2018-04-29 ENCOUNTER — Other Ambulatory Visit: Payer: Self-pay | Admitting: Physician Assistant

## 2018-04-29 ENCOUNTER — Encounter: Payer: Self-pay | Admitting: Physician Assistant

## 2018-04-29 ENCOUNTER — Ambulatory Visit: Payer: Self-pay | Admitting: Physician Assistant

## 2018-04-29 VITALS — BP 136/85 | HR 73 | Temp 97.9°F | Ht 70.75 in | Wt 193.8 lb

## 2018-04-29 DIAGNOSIS — E1165 Type 2 diabetes mellitus with hyperglycemia: Secondary | ICD-10-CM

## 2018-04-29 DIAGNOSIS — I10 Essential (primary) hypertension: Secondary | ICD-10-CM

## 2018-04-29 DIAGNOSIS — F17210 Nicotine dependence, cigarettes, uncomplicated: Secondary | ICD-10-CM

## 2018-04-29 DIAGNOSIS — E785 Hyperlipidemia, unspecified: Secondary | ICD-10-CM

## 2018-04-29 MED ORDER — SITAGLIPTIN PHOSPHATE 100 MG PO TABS: 100.0000 mg | ORAL_TABLET | Freq: Every day | ORAL | 0 refills | Status: DC

## 2018-04-29 MED ORDER — ATORVASTATIN CALCIUM 80 MG PO TABS: 80.0000 mg | ORAL_TABLET | Freq: Every day | ORAL | 1 refills | Status: DC

## 2018-04-29 MED ORDER — LISINOPRIL 10 MG PO TABS: 10.0000 mg | ORAL_TABLET | Freq: Every day | ORAL | 1 refills | Status: DC

## 2018-04-29 MED ORDER — FISH OIL 1200 MG PO CAPS: 2400.0000 mg | ORAL_CAPSULE | Freq: Every day | ORAL | Status: DC

## 2018-04-29 MED ORDER — METFORMIN HCL 1000 MG PO TABS: 1000.0000 mg | ORAL_TABLET | Freq: Two times a day (BID) | ORAL | 1 refills | Status: DC

## 2018-04-29 NOTE — Progress Notes (Signed)
BP 136/85 (BP Location: Right Arm, Patient Position: Sitting, Cuff Size: Normal)   Pulse 73   Temp 97.9 F (36.6 C)   Ht 5' 10.75" (1.797 m)   Wt 193 lb 12 oz (87.9 kg)   SpO2 97%   BMI 27.21 kg/m    Subjective:    Patient ID: Mark Phillips, male    DOB: [birth year], 47 y.o.   MRN: [id number]  HPI: Mark Phillips is a 47 y.o. male presenting on 04/29/2018 for Diabetes   HPI   pt without complaint today   Relevant past medical, surgical, family and social history reviewed and updated as indicated. Interim medical history since our last visit reviewed. Allergies and medications reviewed and updated.   Current Outpatient Medications:  .  atorvastatin (LIPITOR) 80 MG tablet, Take 80 mg by mouth at bedtime., Disp: , Rfl:  .  GLIPIZIDE XL 5 MG 24 hr tablet, Take 1 tablet (5 mg total) by mouth daily., Disp: 30 tablet, Rfl: 0 .  ibuprofen (ADVIL,MOTRIN) 200 MG tablet, Take 600 mg by mouth every 6 (six) hours as needed for headache., Disp: , Rfl:  .  lisinopril (PRINIVIL,ZESTRIL) 5 MG tablet, Take 1 tablet (5 mg total) by mouth daily., Disp: 30 tablet, Rfl: 1 .  metFORMIN (GLUCOPHAGE) 500 MG tablet, Take 1 tablet (500 mg total) by mouth 2 (two) times daily with a meal., Disp: 60 tablet, Rfl: 1   Review of Systems  Constitutional: Negative for appetite change, chills, diaphoresis, fatigue, fever and unexpected weight change.  HENT: Positive for dental problem and sneezing. Negative for congestion, drooling, ear pain, facial swelling, hearing loss, mouth sores, sore throat, trouble swallowing and voice change.   Eyes: Positive for itching. Negative for pain, discharge, redness and visual disturbance.  Respiratory: Negative for cough, choking, shortness of breath and wheezing.   Cardiovascular: Negative for chest pain, palpitations and leg swelling.  Gastrointestinal: Negative for abdominal pain, blood in stool, constipation, diarrhea and vomiting.  Endocrine: Negative for cold  intolerance, heat intolerance and polydipsia.  Genitourinary: Negative for decreased urine volume, dysuria and hematuria.  Musculoskeletal: Negative for arthralgias, back pain and gait problem.  Skin: Negative for rash.  Allergic/Immunologic: Negative for environmental allergies.  Neurological: Negative for seizures, syncope, light-headedness and headaches.  Hematological: Negative for adenopathy.  Psychiatric/Behavioral: Negative for agitation, dysphoric mood and suicidal ideas. The patient is not nervous/anxious.     Per HPI unless specifically indicated above     Objective:    BP 136/85 (BP Location: Right Arm, Patient Position: Sitting, Cuff Size: Normal)   Pulse 73   Temp 97.9 F (36.6 C)   Ht 5' 10.75" (1.797 m)   Wt 193 lb 12 oz (87.9 kg)   SpO2 97%   BMI 27.21 kg/m   Wt Readings from Last 3 Encounters:  04/29/18 193 lb 12 oz (87.9 kg)  03/24/18 193 lb (87.5 kg)  10/28/16 201 lb 3.2 oz (91.3 kg)    Physical Exam  Constitutional: He is oriented to person, place, and time. He appears well-developed and well-nourished.  HENT:  Head: Normocephalic and atraumatic.  Neck: Neck supple.  Cardiovascular: Normal rate and regular rhythm.  Pulmonary/Chest: Effort normal and breath sounds normal. He has no wheezes.  Abdominal: Soft. Bowel sounds are normal. There is no hepatosplenomegaly. There is no tenderness.  Musculoskeletal: He exhibits no edema.  Lymphadenopathy:    He has no cervical adenopathy.  Neurological: He is alert and oriented to person, place, and time.  Skin: Skin is warm and dry.  Psychiatric: He has a normal mood and affect. His behavior is normal.  Vitals reviewed.   Results for orders placed or performed during the hospital encounter of 03/27/18  Microalbumin, urine  Result Value Ref Range   Microalb, Ur 28.5 (H) Not Estab. ug/mL  Hemoglobin A1c  Result Value Ref Range   Hgb A1c MFr Bld 10.7 (H) 4.8 - 5.6 %   Mean Plasma Glucose 260.39 mg/dL  Lipid  panel  Result Value Ref Range   Cholesterol 194 0 - 200 mg/dL   Triglycerides 288 (H) <150 mg/dL   HDL 28 (L) >40 mg/dL   Total CHOL/HDL Ratio 6.9 RATIO   VLDL 58 (H) 0 - 40 mg/dL   LDL Cholesterol 108 (H) 0 - 99 mg/dL  Comprehensive metabolic panel  Result Value Ref Range   Sodium 135 135 - 145 mmol/L   Potassium 4.8 3.5 - 5.1 mmol/L   Chloride 101 101 - 111 mmol/L   CO2 26 22 - 32 mmol/L   Glucose, Bld 246 (H) 65 - 99 mg/dL   BUN 11 6 - 20 mg/dL   Creatinine, Ser 0.65 0.61 - 1.24 mg/dL   Calcium 9.6 8.9 - 10.3 mg/dL   Total Protein 7.9 6.5 - 8.1 g/dL   Albumin 4.4 3.5 - 5.0 g/dL   AST 16 15 - 41 U/L   ALT 21 17 - 63 U/L   Alkaline Phosphatase 61 38 - 126 U/L   Total Bilirubin 0.7 0.3 - 1.2 mg/dL   GFR calc non Af Amer >60 >60 mL/min   GFR calc Af Amer >60 >60 mL/min   Anion gap 8 5 - 15      Assessment & Plan:   Encounter Diagnoses  Name Primary?  [name] Uncontrolled type 2 diabetes mellitus with hyperglycemia ([place]) Yes  . Essential hypertension   . Hyperlipidemia, unspecified hyperlipidemia type   . Cigarette nicotine dependence without complication     -reviewed labs with pt -Continue atorvastatin and add fish oil 2 daily -increase lisinopril to 10mg  qd -Increase metformin to 1g bid -Add januvia (pt given samples) and discontinue glipizide -pt is signed up for [facility]ist in order to get the [place] -refer for annual DM eye exam -pt to Follow up 3 weeks with bs log.  RTO sooner prn

## 2018-04-29 NOTE — Patient Instructions (Signed)
For [facility] -zero income statement- married -spouse's 4506-T tax form -9977 tax form for 2018 -letter of support

## 2018-05-19 ENCOUNTER — Ambulatory Visit: Payer: Self-pay | Admitting: Physician Assistant

## 2018-05-27 ENCOUNTER — Ambulatory Visit: Payer: Self-pay | Admitting: Physician Assistant

## 2018-06-03 ENCOUNTER — Ambulatory Visit: Payer: Self-pay | Admitting: Physician Assistant

## 2018-06-03 ENCOUNTER — Encounter: Payer: Self-pay | Admitting: Physician Assistant

## 2018-06-03 VITALS — BP 138/86 | HR 73 | Temp 98.1°F | Ht 70.75 in | Wt 189.0 lb

## 2018-06-03 DIAGNOSIS — E1165 Type 2 diabetes mellitus with hyperglycemia: Secondary | ICD-10-CM

## 2018-06-03 DIAGNOSIS — F17210 Nicotine dependence, cigarettes, uncomplicated: Secondary | ICD-10-CM

## 2018-06-03 DIAGNOSIS — I10 Essential (primary) hypertension: Secondary | ICD-10-CM

## 2018-06-03 DIAGNOSIS — E785 Hyperlipidemia, unspecified: Secondary | ICD-10-CM

## 2018-06-03 MED ORDER — METFORMIN HCL 1000 MG PO TABS: 1000.0000 mg | ORAL_TABLET | Freq: Two times a day (BID) | ORAL | 1 refills | Status: DC

## 2018-06-03 MED ORDER — ATORVASTATIN CALCIUM 80 MG PO TABS: 80.0000 mg | ORAL_TABLET | Freq: Every day | ORAL | 1 refills | Status: DC

## 2018-06-03 MED ORDER — LISINOPRIL 20 MG PO TABS: 20.0000 mg | ORAL_TABLET | Freq: Every day | ORAL | 1 refills | Status: DC

## 2018-06-03 NOTE — Patient Instructions (Addendum)
[facility] [address] [place] [address] [phone] July 07, 2018 at 9:40 am  For [facility]: -spouse's [phone] form for non filing -7782 tax form for 2018        ---------------------------   Insulin Injection Instructions, Using Insulin Pens, Adult A subcutaneous injection is a shot of medicine that is injected into the layer of fat between skin and muscle. People with type 1 diabetes must take insulin because their bodies do not make it. People with type 2 diabetes may need to take insulin. There are many different types of insulin. The type of insulin that you take may determine how many injections you give yourself and when you need to take the injections. Choosing a site for injection Insulin absorption varies from site to site. It is best to inject insulin within the same body area, using a different spot in that area for each injection. Do not inject the insulin in the same spot for each injection. There are five main areas that can be used for injecting. These areas include:  Abdomen. This is the preferred area.  Front of thigh.  Upper, outer side of thigh.  Back of upper arm.  Buttocks.  Using an insulin pen First, follow the steps for Getting Ready, then continue with the steps for Injecting the Insulin. Getting Ready 1. Wash your hands with soap and water. If soap and water are not available, use hand sanitizer. 2. Check the expiration date and type of insulin in the pen. 3. If you are using CLEAR insulin, check to see that it is clear and free of clumps. 4. If you are using CLOUDY insulin, gently roll the pen between your palms several times, or tip the pen up and down several times to mix up the medicine. Do not shake the pen. 5. Remove the cap from the insulin pen. 6. Use an alcohol wipe to clean the rubber stopper of the pen cartridge. 7. Remove the protective paper tab from the disposable needle. Do not let the needle touch  anything. 8. Screw the needle onto the pen. 9. Remove the outer and inner plastic covers from the needle. Do not throw away the outer plastic cover yet. 10. Prime the insulin pen by turning the button (dial) to 2 units. Hold the pen with the needle pointing up, and push the button on the opposite end of the pen until a drop of insulin appears at the needle tip. If no insulin appears, repeat this step. 11. Dial the number of units of insulin that you will be injecting. Injecting the Insulin  1. Use an alcohol wipe to clean the site where you will be injecting the needle. Let the site air-dry. 2. Hold the pen in the palm of your writing hand with your thumb on the top. 3. If directed by your health care provider, use your other hand to pinch and hold about an inch of skin at the injection site. Do not directly touch the cleaned part of the skin. 4. Gently but quickly, put the needle straight into the skin. The needle should be at a 90-degree angle (perpendicular) to the skin, as if to form the letter "L." ? For example, if you are giving an injection in the abdomen, the abdomen forms one "leg" of the "L" and the needle forms the other "leg" of the "L." 5. For adults who have a small amount of body fat, the needle may need to be injected at a 45-degree angle instead.  Your health care provider will tell you if this is necessary. ? A 45-degree angle looks like the letter "V." 6. When the needle is completely inserted into the skin, use the thumb of your writing hand to push the top button of the pen down all the way to inject the insulin. 7. Let go of the skin that you are pinching. Continue to hold the pen in place with your writing hand. 8. Wait five seconds, then pull the needle straight out of the skin. 9. Carefully put the larger (outer) plastic cover of the needle back over the needle, then unscrew the capped needle and discard it in a sharps container, such as an empty plastic bottle with a  cover. 10. Put the plastic cap back on the insulin pen. Throwing away supplies  Discard all used needles in a puncture-proof sharps disposal container. You can ask your local pharmacy about where you can get this kind of disposal container, or you can use an empty liquid laundry detergent bottle that has a cover.  Follow the disposal regulations for the area where you live. Do not use any needle more than one time.  Throw away empty disposable pens in the regular trash. What questions should I ask my health care provider?  How often should I be taking insulin?  How often should I check my blood glucose?  What amount of insulin should I be taking at each time?  What are the side effects?  What should I do if my blood glucose is too high?  What should I do if my blood glucose is too low?  What should I do if I forget to take my insulin?  What number should I call if I have questions? Where can I get more information?  American Diabetes Association (ADA): www.diabetes.org  American Association of Diabetes Educators (AADE) Patient Resources: https://www.diabeteseducator.org/patient-resources This information is not intended to replace advice given to you by your health care provider. Make sure you discuss any questions you have with your health care provider. Document Released: 10/27/2015 Document Revised: 02/29/2016 Document Reviewed: 10/27/2015 Elsevier Interactive Patient Education  [employer].

## 2018-06-03 NOTE — Progress Notes (Signed)
BP 138/86 (BP Location: Left Arm, Patient Position: Sitting, Cuff Size: Normal)   Pulse 73   Temp 98.1 F (36.7 C) (Other (Comment))   Ht 5' 10.75" (1.797 m)   Wt 189 lb (85.7 kg)   SpO2 99%   BMI 26.55 kg/m    Subjective:    Patient ID: Mark Phillips, male    DOB: May 08, 1971, 47 y.o.   MRN: [id number]  HPI: Mark Phillips is a 47 y.o. male presenting on 06/03/2018 for Diabetes   HPI   Pt is feeling well.  He is having no problems with his new medicines.  He brought blood sugar log.  bs log range- 175-260  Pt brought in papers for medassist today  Relevant past medical, surgical, family and social history reviewed and updated as indicated. Interim medical history since our last visit reviewed. Allergies and medications reviewed and updated.   Current Outpatient Medications:  .  atorvastatin (LIPITOR) 80 MG tablet, Take 1 tablet (80 mg total) by mouth at bedtime., Disp: 30 tablet, Rfl: 1 .  ibuprofen (ADVIL,MOTRIN) 200 MG tablet, Take 600 mg by mouth every 6 (six) hours as needed for headache., Disp: , Rfl:  .  lisinopril (PRINIVIL,ZESTRIL) 10 MG tablet, Take 1 tablet (10 mg total) by mouth daily., Disp: 30 tablet, Rfl: 1 .  metFORMIN (GLUCOPHAGE) 1000 MG tablet, Take 1 tablet (1,000 mg total) by mouth 2 (two) times daily with a meal., Disp: 60 tablet, Rfl: 1 .  Omega-3 Fatty Acids (FISH OIL) 1200 MG CAPS, Take 2 capsules (2,400 mg total) by mouth daily., Disp: , Rfl:  .  sitaGLIPtin (JANUVIA) 100 MG tablet, Take 1 tablet (100 mg total) by mouth daily., Disp: 28 tablet, Rfl: 0   Review of Systems  Constitutional: Negative for appetite change, chills, diaphoresis, fatigue, fever and unexpected weight change.  HENT: Positive for dental problem. Negative for congestion, drooling, ear pain, facial swelling, hearing loss, mouth sores, sneezing, sore throat, trouble swallowing and voice change.   Eyes: Negative for pain, discharge, redness, itching and visual disturbance.   Respiratory: Negative for cough, choking, shortness of breath and wheezing.   Cardiovascular: Negative for chest pain, palpitations and leg swelling.  Gastrointestinal: Positive for abdominal pain. Negative for blood in stool, constipation, diarrhea and vomiting.  Endocrine: Positive for cold intolerance. Negative for heat intolerance and polydipsia.  Genitourinary: Negative for decreased urine volume, dysuria and hematuria.  Musculoskeletal: Positive for back pain. Negative for arthralgias and gait problem.  Skin: Negative for rash.  Allergic/Immunologic: Negative for environmental allergies.  Neurological: Negative for seizures, syncope, light-headedness and headaches.  Hematological: Negative for adenopathy.  Psychiatric/Behavioral: Negative for agitation, dysphoric mood and suicidal ideas. The patient is not nervous/anxious.     Per HPI unless specifically indicated above     Objective:    BP 138/86 (BP Location: Left Arm, Patient Position: Sitting, Cuff Size: Normal)   Pulse 73   Temp 98.1 F (36.7 C) (Other (Comment))   Ht 5' 10.75" (1.797 m)   Wt 189 lb (85.7 kg)   SpO2 99%   BMI 26.55 kg/m   Wt Readings from Last 3 Encounters:  06/03/18 189 lb (85.7 kg)  04/29/18 193 lb 12 oz (87.9 kg)  03/24/18 193 lb (87.5 kg)    Physical Exam  Constitutional: He is oriented to person, place, and time. He appears well-developed and well-nourished.  HENT:  Head: Normocephalic and atraumatic.  Neck: Neck supple.  Cardiovascular: Normal rate and regular rhythm.  Pulmonary/Chest: Effort normal and breath sounds normal. He has no wheezes.  Abdominal: Soft. Bowel sounds are normal. There is no hepatosplenomegaly. There is no tenderness.  Musculoskeletal: He exhibits no edema.  Lymphadenopathy:    He has no cervical adenopathy.  Neurological: He is alert and oriented to person, place, and time.  Skin: Skin is warm and dry.  Psychiatric: He has a normal mood and affect. His behavior  is normal.  Vitals reviewed.       Assessment & Plan:    Encounter Diagnoses  Name Primary?  [name] Uncontrolled type 2 diabetes mellitus with hyperglycemia ([place]) Yes  . Essential hypertension   . Hyperlipidemia, unspecified hyperlipidemia type   . Cigarette nicotine dependence without complication     -Increase lisinopril to 20mg  -Continue metformin and januvia and add lantus 10u qhs.  Pt is instructed on how to properly administer the insulin and was given reading materials.  He is to monitor his fbs.  He is instructed to call office for fbs < 70 or > 300.   -pt to follow up with bs log in 3 weeks.  RTO sooner prn

## 2018-06-18 ENCOUNTER — Other Ambulatory Visit: Payer: Self-pay | Admitting: Physician Assistant

## 2018-06-18 MED ORDER — SITAGLIPTIN PHOSPHATE 100 MG PO TABS: 100.0000 mg | ORAL_TABLET | Freq: Every day | ORAL | 0 refills | Status: DC

## 2018-06-18 MED ORDER — LISINOPRIL 20 MG PO TABS: 20.0000 mg | ORAL_TABLET | Freq: Every day | ORAL | 1 refills | Status: DC

## 2018-06-18 MED ORDER — ATORVASTATIN CALCIUM 80 MG PO TABS: 80.0000 mg | ORAL_TABLET | Freq: Every day | ORAL | 1 refills | Status: DC

## 2018-06-18 MED ORDER — METFORMIN HCL 1000 MG PO TABS: 1000.0000 mg | ORAL_TABLET | Freq: Two times a day (BID) | ORAL | 1 refills | Status: DC

## 2018-06-24 ENCOUNTER — Ambulatory Visit: Payer: Self-pay | Admitting: Physician Assistant

## 2018-06-24 ENCOUNTER — Encounter: Payer: Self-pay | Admitting: Physician Assistant

## 2018-06-24 VITALS — BP 124/80 | HR 73 | Temp 97.7°F | Ht 70.75 in | Wt 185.5 lb

## 2018-06-24 DIAGNOSIS — E1165 Type 2 diabetes mellitus with hyperglycemia: Secondary | ICD-10-CM

## 2018-06-24 DIAGNOSIS — E785 Hyperlipidemia, unspecified: Secondary | ICD-10-CM

## 2018-06-24 DIAGNOSIS — G4733 Obstructive sleep apnea (adult) (pediatric): Secondary | ICD-10-CM

## 2018-06-24 DIAGNOSIS — F17200 Nicotine dependence, unspecified, uncomplicated: Secondary | ICD-10-CM

## 2018-06-24 DIAGNOSIS — I10 Essential (primary) hypertension: Secondary | ICD-10-CM

## 2018-06-24 NOTE — Patient Instructions (Signed)
[email or web address]

## 2018-06-24 NOTE — Progress Notes (Signed)
BP 124/80 (BP Location: Left Arm, Patient Position: Sitting, Cuff Size: Normal)   Pulse 73   Temp 97.7 F (36.5 C)   Ht 5' 10.75" (1.797 m)   Wt 185 lb 8 oz (84.1 kg)   SpO2 98%   BMI 26.06 kg/m    Subjective:    Patient ID: Mark Phillips, male    DOB: Sep 07, 1971, 47 y.o.   MRN: [id number]  HPI: Mark Phillips is a 47 y.o. male presenting on 06/24/2018 for Diabetes and Hypertension   HPI  Pt is feeling well today.  Says he feels better now that BP and bs improving.  He has bs log.  morning fbs range 143-88  Relevant past medical, surgical, family and social history reviewed and updated as indicated. Interim medical history since our last visit reviewed. Allergies and medications reviewed and updated.   Current Outpatient Medications:  .  atorvastatin (LIPITOR) 80 MG tablet, Take 1 tablet (80 mg total) by mouth at bedtime., Disp: 90 tablet, Rfl: 1 .  ibuprofen (ADVIL,MOTRIN) 200 MG tablet, Take 600 mg by mouth every 6 (six) hours as needed for headache., Disp: , Rfl:  .  Insulin Glargine (LANTUS) 100 UNIT/ML Solostar Pen, Inject 10 Units into the skin at bedtime., Disp: , Rfl:  .  lisinopril (PRINIVIL,ZESTRIL) 20 MG tablet, Take 1 tablet (20 mg total) by mouth daily., Disp: 90 tablet, Rfl: 1 .  metFORMIN (GLUCOPHAGE) 1000 MG tablet, Take 1 tablet (1,000 mg total) by mouth 2 (two) times daily with a meal., Disp: 180 tablet, Rfl: 1 .  Omega-3 Fatty Acids (FISH OIL) 1200 MG CAPS, Take 2 capsules (2,400 mg total) by mouth daily., Disp: , Rfl:  .  sitaGLIPtin (JANUVIA) 100 MG tablet, Take 1 tablet (100 mg total) by mouth daily., Disp: 90 tablet, Rfl: 0   Review of Systems  Constitutional: Negative for appetite change, chills, diaphoresis, fatigue, fever and unexpected weight change.  HENT: Positive for dental problem. Negative for congestion, drooling, ear pain, facial swelling, hearing loss, mouth sores, sneezing, sore throat, trouble swallowing and voice change.   Eyes:  Positive for itching. Negative for pain, discharge, redness and visual disturbance.  Respiratory: Negative for cough, choking, shortness of breath and wheezing.   Cardiovascular: Negative for chest pain, palpitations and leg swelling.  Gastrointestinal: Negative for abdominal pain, blood in stool, constipation, diarrhea and vomiting.  Endocrine: Positive for cold intolerance. Negative for heat intolerance and polydipsia.  Genitourinary: Negative for decreased urine volume, dysuria and hematuria.  Musculoskeletal: Positive for back pain. Negative for arthralgias and gait problem.  Skin: Negative for rash.  Allergic/Immunologic: Negative for environmental allergies.  Neurological: Negative for seizures, syncope, light-headedness and headaches.  Hematological: Negative for adenopathy.  Psychiatric/Behavioral: Negative for agitation, dysphoric mood and suicidal ideas. The patient is not nervous/anxious.     Per HPI unless specifically indicated above     Objective:    BP 124/80 (BP Location: Left Arm, Patient Position: Sitting, Cuff Size: Normal)   Pulse 73   Temp 97.7 F (36.5 C)   Ht 5' 10.75" (1.797 m)   Wt 185 lb 8 oz (84.1 kg)   SpO2 98%   BMI 26.06 kg/m   Wt Readings from Last 3 Encounters:  06/24/18 185 lb 8 oz (84.1 kg)  06/03/18 189 lb (85.7 kg)  04/29/18 193 lb 12 oz (87.9 kg)    Physical Exam  Constitutional: He is oriented to person, place, and time. He appears well-developed and well-nourished.  HENT:  Head: Normocephalic and atraumatic.  Neck: Neck supple.  Cardiovascular: Normal rate and regular rhythm.  Pulmonary/Chest: Effort normal and breath sounds normal. He has no wheezes.  Abdominal: Soft. Bowel sounds are normal. There is no hepatosplenomegaly. There is no tenderness.  Musculoskeletal: He exhibits no edema.  Lymphadenopathy:    He has no cervical adenopathy.  Neurological: He is alert and oriented to person, place, and time.  Skin: Skin is warm and dry.   Psychiatric: He has a normal mood and affect. His behavior is normal.  Vitals reviewed.       Assessment & Plan:    Encounter Diagnoses  Name Primary?  [name] Uncontrolled type 2 diabetes mellitus with hyperglycemia ([place]) Yes  . Essential hypertension   . Hyperlipidemia, unspecified hyperlipidemia type   . Tobacco use disorder   . OSA (obstructive sleep apnea)     -pt to continue current medication and insulin -he is to go to [facility] 10/1 as scheduled -gave pt contact information to get assistance with CPAP -pt to follow up 2 months.  RTO sooner prn

## 2018-07-09 ENCOUNTER — Encounter: Payer: Self-pay | Admitting: Physician Assistant

## 2018-08-20 ENCOUNTER — Other Ambulatory Visit (HOSPITAL_COMMUNITY)
Admission: RE | Admit: 2018-08-20 | Discharge: 2018-08-20 | Disposition: A | Discharge status: Home / Self Care | Payer: Self-pay | Source: Ambulatory Visit | Attending: Physician Assistant | Admitting: Physician Assistant

## 2018-08-20 DIAGNOSIS — E1165 Type 2 diabetes mellitus with hyperglycemia: Secondary | ICD-10-CM | POA: Insufficient documentation

## 2018-08-20 DIAGNOSIS — I10 Essential (primary) hypertension: Secondary | ICD-10-CM | POA: Insufficient documentation

## 2018-08-20 DIAGNOSIS — E785 Hyperlipidemia, unspecified: Secondary | ICD-10-CM | POA: Insufficient documentation

## 2018-08-20 LAB — LIPID PANEL
Cholesterol: 106 mg/dL (ref 0–200)
HDL: 25 mg/dL — AB (ref >40)
LDL Cholesterol: 56 mg/dL (ref 0–99)
TRIGLYCERIDES: 125 mg/dL (ref <150)
Total CHOL/HDL Ratio: 4.2 RATIO
VLDL: 25 mg/dL (ref 0–40)

## 2018-08-20 LAB — COMPREHENSIVE METABOLIC PANEL
ALK PHOS: 44 U/L (ref 38–126)
ALT: 24 U/L (ref 0–44)
ANION GAP: 8 (ref 5–15)
AST: 18 U/L (ref 15–41)
Albumin: 4.3 g/dL (ref 3.5–5.0)
BUN: 5 mg/dL — ABNORMAL LOW (ref 6–20)
CHLORIDE: 101 mmol/L (ref 98–111)
CO2: 27 mmol/L (ref 22–32)
Calcium: 9.4 mg/dL (ref 8.9–10.3)
Creatinine, Ser: 0.73 mg/dL (ref 0.61–1.24)
Glucose, Bld: 111 mg/dL — ABNORMAL HIGH (ref 70–99)
POTASSIUM: 4.4 mmol/L (ref 3.5–5.1)
SODIUM: 136 mmol/L (ref 135–145)
Total Bilirubin: 0.8 mg/dL (ref 0.3–1.2)
Total Protein: 7.8 g/dL (ref 6.5–8.1)

## 2018-08-20 LAB — HEMOGLOBIN A1C
Hgb A1c MFr Bld: 6 % — ABNORMAL HIGH (ref 4.8–5.6)
MEAN PLASMA GLUCOSE: 125.5 mg/dL

## 2018-08-24 ENCOUNTER — Encounter: Payer: Self-pay | Admitting: Physician Assistant

## 2018-08-24 ENCOUNTER — Ambulatory Visit: Payer: Self-pay | Admitting: Physician Assistant

## 2018-08-24 VITALS — BP 130/84 | HR 88 | Temp 98.1°F | Ht 70.75 in | Wt 184.5 lb

## 2018-08-24 DIAGNOSIS — E785 Hyperlipidemia, unspecified: Secondary | ICD-10-CM

## 2018-08-24 DIAGNOSIS — I10 Essential (primary) hypertension: Secondary | ICD-10-CM

## 2018-08-24 DIAGNOSIS — E119 Type 2 diabetes mellitus without complications: Secondary | ICD-10-CM

## 2018-08-24 DIAGNOSIS — F17200 Nicotine dependence, unspecified, uncomplicated: Secondary | ICD-10-CM

## 2018-08-24 DIAGNOSIS — J209 Acute bronchitis, unspecified: Secondary | ICD-10-CM

## 2018-08-24 MED ORDER — AMOXICILLIN 500 MG PO CAPS: 500.0000 mg | ORAL_CAPSULE | Freq: Three times a day (TID) | ORAL | 0 refills | Status: DC

## 2018-08-24 NOTE — Patient Instructions (Signed)

## 2018-08-24 NOTE — Progress Notes (Signed)
BP 130/84   Pulse 88   Temp 98.1 F (36.7 C)   Ht 5' 10.75" (1.797 m)   Wt 184 lb 8 oz (83.7 kg)   SpO2 98%   BMI 25.91 kg/m    Subjective:    Patient ID: Mark Phillips, male    DOB: March 11, 1971, 47 y.o.   MRN: [id number]  HPI: Mark Phillips is a 47 y.o. male presenting on 08/24/2018 for Diabetes; Hypertension; and Hyperlipidemia   HPI  Pt complains of sore throat, subjective fever last wednesday and last night, cough, chills, sweats. pt has taken IBU, and otc allergy tabs which have not helped.  Pt is still smoking  Relevant past medical, surgical, family and social history reviewed and updated as indicated. Interim medical history since our last visit reviewed. Allergies and medications reviewed and updated.   Current Outpatient Medications:  .  atorvastatin (LIPITOR) 80 MG tablet, Take 1 tablet (80 mg total) by mouth at bedtime., Disp: 90 tablet, Rfl: 1 .  ibuprofen (ADVIL,MOTRIN) 200 MG tablet, Take 600 mg by mouth every 6 (six) hours as needed for headache., Disp: , Rfl:  .  Insulin Glargine (LANTUS) 100 UNIT/ML Solostar Pen, Inject 10 Units into the skin at bedtime., Disp: , Rfl:  .  lisinopril (PRINIVIL,ZESTRIL) 20 MG tablet, Take 1 tablet (20 mg total) by mouth daily., Disp: 90 tablet, Rfl: 1 .  metFORMIN (GLUCOPHAGE) 1000 MG tablet, Take 1 tablet (1,000 mg total) by mouth 2 (two) times daily with a meal., Disp: 180 tablet, Rfl: 1 .  Omega-3 Fatty Acids (FISH OIL) 1200 MG CAPS, Take 2 capsules (2,400 mg total) by mouth daily., Disp: , Rfl:  .  sitaGLIPtin (JANUVIA) 100 MG tablet, Take 1 tablet (100 mg total) by mouth daily., Disp: 90 tablet, Rfl: 0   Review of Systems  Constitutional: Positive for chills, fatigue and fever (subjective). Negative for appetite change, diaphoresis and unexpected weight change.  HENT: Positive for congestion, sneezing and sore throat. Negative for dental problem, drooling, ear pain, facial swelling, hearing loss, mouth sores, trouble  swallowing and voice change.   Eyes: Negative for pain, discharge, redness, itching and visual disturbance.  Respiratory: Positive for cough. Negative for choking, shortness of breath and wheezing.   Cardiovascular: Negative for chest pain, palpitations and leg swelling.  Gastrointestinal: Positive for diarrhea. Negative for abdominal pain, blood in stool, constipation and vomiting.  Endocrine: Positive for cold intolerance. Negative for heat intolerance and polydipsia.  Genitourinary: Negative for decreased urine volume, dysuria and hematuria.  Musculoskeletal: Positive for back pain. Negative for arthralgias and gait problem.  Skin: Negative for rash.  Allergic/Immunologic: Negative for environmental allergies.  Neurological: Negative for seizures, syncope, light-headedness and headaches.  Hematological: Negative for adenopathy.  Psychiatric/Behavioral: Negative for agitation, dysphoric mood and suicidal ideas. The patient is not nervous/anxious.     Per HPI unless specifically indicated above     Objective:    BP 130/84   Pulse 88   Temp 98.1 F (36.7 C)   Ht 5' 10.75" (1.797 m)   Wt 184 lb 8 oz (83.7 kg)   SpO2 98%   BMI 25.91 kg/m   Wt Readings from Last 3 Encounters:  08/24/18 184 lb 8 oz (83.7 kg)  06/24/18 185 lb 8 oz (84.1 kg)  06/03/18 189 lb (85.7 kg)    Physical Exam  Constitutional: He is oriented to person, place, and time. He appears well-developed and well-nourished.  HENT:  Head: Normocephalic and atraumatic.  Right Ear: Hearing, tympanic membrane, external ear and ear canal normal.  Left Ear: Hearing, tympanic membrane, external ear and ear canal normal.  Nose: Nose normal.  Mouth/Throat: Uvula is midline and oropharynx is clear and moist. No uvula swelling. No oropharyngeal exudate, posterior oropharyngeal edema, posterior oropharyngeal erythema or tonsillar abscesses.  Neck: Neck supple.  Cardiovascular: Normal rate and regular rhythm.  Pulmonary/Chest:  Effort normal and breath sounds normal. He has no wheezes.  Abdominal: Soft. Bowel sounds are normal. There is no hepatosplenomegaly. There is no tenderness.  Musculoskeletal: He exhibits no edema.  Lymphadenopathy:    He has no cervical adenopathy.  Neurological: He is alert and oriented to person, place, and time.  Skin: Skin is warm and dry.  Psychiatric: He has a normal mood and affect. His behavior is normal.  Vitals reviewed.   Results for orders placed or performed during the hospital encounter of 08/20/18  Hemoglobin A1c  Result Value Ref Range   Hgb A1c MFr Bld 6.0 (H) 4.8 - 5.6 %   Mean Plasma Glucose 125.5 mg/dL  Lipid panel  Result Value Ref Range   Cholesterol 106 0 - 200 mg/dL   Triglycerides 125 <150 mg/dL   HDL 25 (L) >40 mg/dL   Total CHOL/HDL Ratio 4.2 RATIO   VLDL 25 0 - 40 mg/dL   LDL Cholesterol 56 0 - 99 mg/dL  Comprehensive metabolic panel  Result Value Ref Range   Sodium 136 135 - 145 mmol/L   Potassium 4.4 3.5 - 5.1 mmol/L   Chloride 101 98 - 111 mmol/L   CO2 27 22 - 32 mmol/L   Glucose, Bld 111 (H) 70 - 99 mg/dL   BUN 5 (L) 6 - 20 mg/dL   Creatinine, Ser 0.73 0.61 - 1.24 mg/dL   Calcium 9.4 8.9 - 10.3 mg/dL   Total Protein 7.8 6.5 - 8.1 g/dL   Albumin 4.3 3.5 - 5.0 g/dL   AST 18 15 - 41 U/L   ALT 24 0 - 44 U/L   Alkaline Phosphatase 44 38 - 126 U/L   Total Bilirubin 0.8 0.3 - 1.2 mg/dL   GFR calc non Af Amer >60 >60 mL/min   GFR calc Af Amer >60 >60 mL/min   Anion gap 8 5 - 15      Assessment & Plan:   Encounter Diagnoses  Name Primary?  . Diabetes mellitus without complication ([place]) Yes  . Essential hypertension   . Hyperlipidemia, unspecified hyperlipidemia type   . Tobacco use disorder   . Acute bronchitis, unspecified organism     -reviewed labs with pt -will stop insulin.  Pt to Monitor bs and notify office if they are high.  Pt to continue diabetic diet and oral agents -rx amoxil for bronchitis.   -Counseled smoking  cessation -pt to follow up 3 months.  RTO sooner prn

## 2018-10-13 ENCOUNTER — Other Ambulatory Visit: Payer: Self-pay | Admitting: Physician Assistant

## 2018-11-16 ENCOUNTER — Other Ambulatory Visit (HOSPITAL_COMMUNITY)
Admission: RE | Admit: 2018-11-16 | Discharge: 2018-11-16 | Disposition: A | Discharge status: Home / Self Care | Payer: Self-pay | Source: Ambulatory Visit | Attending: Physician Assistant | Admitting: Physician Assistant

## 2018-11-16 DIAGNOSIS — I10 Essential (primary) hypertension: Secondary | ICD-10-CM | POA: Insufficient documentation

## 2018-11-16 DIAGNOSIS — E119 Type 2 diabetes mellitus without complications: Secondary | ICD-10-CM | POA: Insufficient documentation

## 2018-11-16 DIAGNOSIS — E785 Hyperlipidemia, unspecified: Secondary | ICD-10-CM | POA: Insufficient documentation

## 2018-11-16 LAB — COMPREHENSIVE METABOLIC PANEL
ALK PHOS: 39 U/L (ref 38–126)
ALT: 22 U/L (ref 0–44)
AST: 16 U/L (ref 15–41)
Albumin: 4.5 g/dL (ref 3.5–5.0)
Anion gap: 10 (ref 5–15)
BUN: 12 mg/dL (ref 6–20)
CO2: 25 mmol/L (ref 22–32)
Calcium: 9.6 mg/dL (ref 8.9–10.3)
Chloride: 101 mmol/L (ref 98–111)
Creatinine, Ser: 0.79 mg/dL (ref 0.61–1.24)
GFR calc non Af Amer: >60 mL/min (ref >60)
Glucose, Bld: 161 mg/dL — ABNORMAL HIGH (ref 70–99)
Potassium: 4.3 mmol/L (ref 3.5–5.1)
SODIUM: 136 mmol/L (ref 135–145)
TOTAL PROTEIN: 7.6 g/dL (ref 6.5–8.1)
Total Bilirubin: 0.5 mg/dL (ref 0.3–1.2)

## 2018-11-16 LAB — HEMOGLOBIN A1C
HEMOGLOBIN A1C: 7 % — AB (ref 4.8–5.6)
MEAN PLASMA GLUCOSE: 154.2 mg/dL

## 2018-11-16 LAB — LIPID PANEL
Cholesterol: 110 mg/dL (ref 0–200)
HDL: 27 mg/dL — AB (ref >40)
LDL Cholesterol: 56 mg/dL (ref 0–99)
TRIGLYCERIDES: 137 mg/dL (ref <150)
Total CHOL/HDL Ratio: 4.1 RATIO
VLDL: 27 mg/dL (ref 0–40)

## 2018-11-24 ENCOUNTER — Ambulatory Visit: Payer: Self-pay | Admitting: Physician Assistant

## 2018-11-24 ENCOUNTER — Encounter: Payer: Self-pay | Admitting: Physician Assistant

## 2018-11-24 VITALS — BP 120/74 | HR 79 | Temp 98.1°F | Ht 70.75 in | Wt 185.0 lb

## 2018-11-24 DIAGNOSIS — E119 Type 2 diabetes mellitus without complications: Secondary | ICD-10-CM

## 2018-11-24 DIAGNOSIS — F17200 Nicotine dependence, unspecified, uncomplicated: Secondary | ICD-10-CM

## 2018-11-24 DIAGNOSIS — I10 Essential (primary) hypertension: Secondary | ICD-10-CM

## 2018-11-24 DIAGNOSIS — E785 Hyperlipidemia, unspecified: Secondary | ICD-10-CM

## 2018-11-24 NOTE — Progress Notes (Signed)
BP 120/74 (BP Location: Left Arm, Patient Position: Sitting, Cuff Size: Normal)   Pulse 79   Temp 98.1 F (36.7 C) (Other (Comment))   Ht 5' 10.75" (1.797 m)   Wt 185 lb (83.9 kg)   SpO2 98%   BMI 25.98 kg/m    Subjective:    Patient ID: Mark Phillips, male    DOB: 05-13-1971, 48 y.o.   MRN: [id number]  HPI: Mark Phillips is a 48 y.o. male presenting on 11/24/2018 for Diabetes; Hyperlipidemia; and Hypertension   HPI   Pt is feeling well today and has no complaints  Relevant past medical, surgical, family and social history reviewed and updated as indicated. Interim medical history since our last visit reviewed. Allergies and medications reviewed and updated.    Current Outpatient Medications:  .  atorvastatin (LIPITOR) 80 MG tablet, Take 1 tablet (80 mg total) by mouth at bedtime., Disp: 90 tablet, Rfl: 1 .  ibuprofen (ADVIL,MOTRIN) 200 MG tablet, Take 600 mg by mouth every 6 (six) hours as needed for headache., Disp: , Rfl:  .  JANUVIA 100 MG tablet, TAKE 1 Tablet BY MOUTH ONCE DAILY, Disp: 90 tablet, Rfl: 0 .  lisinopril (PRINIVIL,ZESTRIL) 20 MG tablet, Take 1 tablet (20 mg total) by mouth daily., Disp: 90 tablet, Rfl: 1 .  metFORMIN (GLUCOPHAGE) 1000 MG tablet, Take 1 tablet (1,000 mg total) by mouth 2 (two) times daily with a meal., Disp: 180 tablet, Rfl: 1 .  Omega-3 Fatty Acids (FISH OIL) 1200 MG CAPS, Take 2 capsules (2,400 mg total) by mouth daily., Disp: , Rfl:    Review of Systems  Constitutional: Negative for appetite change, chills, diaphoresis, fatigue, fever and unexpected weight change.  HENT: Positive for congestion and sneezing. Negative for dental problem, drooling, ear pain, facial swelling, hearing loss, mouth sores, sore throat, trouble swallowing and voice change.   Eyes: Negative for pain, discharge, redness, itching and visual disturbance.  Respiratory: Negative for cough, choking, shortness of breath and wheezing.   Cardiovascular: Negative for  chest pain, palpitations and leg swelling.  Gastrointestinal: Negative for abdominal pain, blood in stool, constipation, diarrhea and vomiting.  Endocrine: Positive for cold intolerance. Negative for heat intolerance and polydipsia.  Genitourinary: Negative for decreased urine volume, dysuria and hematuria.  Musculoskeletal: Negative for arthralgias, back pain and gait problem.  Skin: Negative for rash.  Allergic/Immunologic: Negative for environmental allergies.  Neurological: Negative for seizures, syncope, light-headedness and headaches.  Hematological: Negative for adenopathy.  Psychiatric/Behavioral: Negative for agitation, dysphoric mood and suicidal ideas. The patient is not nervous/anxious.     Per HPI unless specifically indicated above     Objective:    BP 120/74 (BP Location: Left Arm, Patient Position: Sitting, Cuff Size: Normal)   Pulse 79   Temp 98.1 F (36.7 C) (Other (Comment))   Ht 5' 10.75" (1.797 m)   Wt 185 lb (83.9 kg)   SpO2 98%   BMI 25.98 kg/m   Wt Readings from Last 3 Encounters:  11/24/18 185 lb (83.9 kg)  08/24/18 184 lb 8 oz (83.7 kg)  06/24/18 185 lb 8 oz (84.1 kg)    Physical Exam Vitals signs reviewed.  Constitutional:      Appearance: He is well-developed.  HENT:     Head: Normocephalic and atraumatic.  Neck:     Musculoskeletal: Neck supple.  Cardiovascular:     Rate and Rhythm: Normal rate and regular rhythm.  Pulmonary:     Effort: Pulmonary effort is normal.  Breath sounds: Normal breath sounds. No wheezing.  Abdominal:     General: Bowel sounds are normal.     Palpations: Abdomen is soft.     Tenderness: There is no abdominal tenderness.  Lymphadenopathy:     Cervical: No cervical adenopathy.  Skin:    General: Skin is warm and dry.  Neurological:     Mental Status: He is alert and oriented to person, place, and time.  Psychiatric:        Behavior: Behavior normal.     Results for orders placed or performed during the  hospital encounter of 11/16/18  Lipid panel  Result Value Ref Range   Cholesterol 110 0 - 200 mg/dL   Triglycerides 137 <150 mg/dL   HDL 27 (L) >40 mg/dL   Total CHOL/HDL Ratio 4.1 RATIO   VLDL 27 0 - 40 mg/dL   LDL Cholesterol 56 0 - 99 mg/dL  Comprehensive metabolic panel  Result Value Ref Range   Sodium 136 135 - 145 mmol/L   Potassium 4.3 3.5 - 5.1 mmol/L   Chloride 101 98 - 111 mmol/L   CO2 25 22 - 32 mmol/L   Glucose, Bld 161 (H) 70 - 99 mg/dL   BUN 12 6 - 20 mg/dL   Creatinine, Ser 0.79 0.61 - 1.24 mg/dL   Calcium 9.6 8.9 - 10.3 mg/dL   Total Protein 7.6 6.5 - 8.1 g/dL   Albumin 4.5 3.5 - 5.0 g/dL   AST 16 15 - 41 U/L   ALT 22 0 - 44 U/L   Alkaline Phosphatase 39 38 - 126 U/L   Total Bilirubin 0.5 0.3 - 1.2 mg/dL   GFR calc non Af Amer >60 >60 mL/min   GFR calc Af Amer >60 >60 mL/min   Anion gap 10 5 - 15  Hemoglobin A1c  Result Value Ref Range   Hgb A1c MFr Bld 7.0 (H) 4.8 - 5.6 %   Mean Plasma Glucose 154.2 mg/dL      Assessment & Plan:    Encounter Diagnoses  Name Primary?  . Diabetes mellitus without complication ([place]) Yes  . Essential hypertension   . Hyperlipidemia, unspecified hyperlipidemia type   . Tobacco use disorder      -reviewed labs with pt -pt to continue current medications -counseled smoking cessation -pt to follow up 3 months.  RTO sooner prn

## 2019-01-05 ENCOUNTER — Other Ambulatory Visit: Payer: Self-pay | Admitting: Physician Assistant

## 2019-02-23 ENCOUNTER — Ambulatory Visit: Payer: Self-pay | Admitting: Physician Assistant

## 2019-02-23 ENCOUNTER — Encounter: Payer: Self-pay | Admitting: Physician Assistant

## 2019-02-23 DIAGNOSIS — E785 Hyperlipidemia, unspecified: Secondary | ICD-10-CM

## 2019-02-23 DIAGNOSIS — E119 Type 2 diabetes mellitus without complications: Secondary | ICD-10-CM

## 2019-02-23 DIAGNOSIS — I10 Essential (primary) hypertension: Secondary | ICD-10-CM

## 2019-02-23 DIAGNOSIS — F17200 Nicotine dependence, unspecified, uncomplicated: Secondary | ICD-10-CM

## 2019-02-23 NOTE — Progress Notes (Signed)
   There were no vitals taken for this visit.   Subjective:    Patient ID: Mark Phillips, male    DOB: Aug 19, 1971, 48 y.o.   MRN: [id number]  HPI: Mark Phillips is a 49 y.o. male presenting on 02/23/2019 for No chief complaint on file.   HPI   This is a telemedicine visit due to coronavirus pandemic.  It is via Telephone as pt does not have a smart phone.   I connected with  [name] on 02/23/19 by a video enabled telemedicine application and verified that I am speaking with the correct person using two identifiers.   I discussed the limitations of evaluation and management by telemedicine. The patient expressed understanding and agreed to proceed.  Pt is at home and provider is in office/clinic  Pt just started working at  [employer] in [place].  He says he is doing well.  He has no complaints at this time.  He is still smoking.   Relevant past medical, surgical, family and social history reviewed and updated as indicated. Interim medical history since our last visit reviewed. Allergies and medications reviewed and updated.   Current Outpatient Medications:  .  atorvastatin (LIPITOR) 80 MG tablet, TAKE 1 Tablet BY MOUTH EVERY NIGHT AT BEDTIME, Disp: 90 tablet, Rfl: 1 .  JANUVIA 100 MG tablet, TAKE 1 Tablet BY MOUTH ONCE DAILY, Disp: 90 tablet, Rfl: 0 .  lisinopril (PRINIVIL,ZESTRIL) 20 MG tablet, TAKE 1 Tablet BY MOUTH ONCE DAILY, Disp: 90 tablet, Rfl: 1 .  metFORMIN (GLUCOPHAGE) 1000 MG tablet, TAKE 1 Tablet  BY MOUTH TWICE DAILY WITH A MEAL, Disp: 180 tablet, Rfl: 1 .  Omega-3 Fatty Acids (FISH OIL) 1200 MG CAPS, Take 2 capsules (2,400 mg total) by mouth daily., Disp: , Rfl:      Review of Systems  Per HPI unless specifically indicated above     Objective:    There were no vitals taken for this visit.  Wt Readings from Last 3 Encounters:  11/24/18 185 lb (83.9 kg)  08/24/18 184 lb 8 oz (83.7 kg)  06/24/18 185 lb 8 oz (84.1 kg)    Physical  Exam Pulmonary:     Effort: Pulmonary effort is normal. No respiratory distress.  Neurological:     Mental Status: He is alert and oriented to person, place, and time.  Psychiatric:        Attention and Perception: Attention normal.        Mood and Affect: Mood normal.        Speech: Speech normal.        Behavior: Behavior is cooperative.        Thought Content: Thought content normal.         Assessment & Plan:    Encounter Diagnoses  Name Primary?  . Diabetes mellitus without complication ([place]) Yes  . Essential hypertension   . Hyperlipidemia, unspecified hyperlipidemia type   . Tobacco use disorder      -labs done in February were good so will defer additional labs at this time -pt to continue current medications -pt will follow up in 3 months.  He will contact office sooner prn

## 2019-05-17 ENCOUNTER — Other Ambulatory Visit: Payer: Self-pay | Admitting: Physician Assistant

## 2019-05-26 ENCOUNTER — Other Ambulatory Visit (HOSPITAL_COMMUNITY)
Admission: RE | Admit: 2019-05-26 | Discharge: 2019-05-26 | Disposition: A | Discharge status: Home / Self Care | Payer: Self-pay | Source: Ambulatory Visit | Attending: Physician Assistant | Admitting: Physician Assistant

## 2019-05-26 DIAGNOSIS — E785 Hyperlipidemia, unspecified: Secondary | ICD-10-CM | POA: Insufficient documentation

## 2019-05-26 DIAGNOSIS — I10 Essential (primary) hypertension: Secondary | ICD-10-CM | POA: Insufficient documentation

## 2019-05-26 DIAGNOSIS — E119 Type 2 diabetes mellitus without complications: Secondary | ICD-10-CM | POA: Insufficient documentation

## 2019-05-26 LAB — COMPREHENSIVE METABOLIC PANEL
ALT: 34 U/L (ref 0–44)
AST: 22 U/L (ref 15–41)
Albumin: 4.1 g/dL (ref 3.5–5.0)
Alkaline Phosphatase: 42 U/L (ref 38–126)
Anion gap: 8 (ref 5–15)
BUN: 12 mg/dL (ref 6–20)
CO2: 26 mmol/L (ref 22–32)
Calcium: 9.3 mg/dL (ref 8.9–10.3)
Chloride: 104 mmol/L (ref 98–111)
Creatinine, Ser: 0.75 mg/dL (ref 0.61–1.24)
GFR calc Af Amer: >60 mL/min (ref >60)
GFR calc non Af Amer: >60 mL/min (ref >60)
Glucose, Bld: 191 mg/dL — ABNORMAL HIGH (ref 70–99)
Potassium: 4.5 mmol/L (ref 3.5–5.1)
Sodium: 138 mmol/L (ref 135–145)
Total Bilirubin: 0.7 mg/dL (ref 0.3–1.2)
Total Protein: 7.2 g/dL (ref 6.5–8.1)

## 2019-05-26 LAB — LIPID PANEL
Cholesterol: 150 mg/dL (ref 0–200)
HDL: 24 mg/dL — ABNORMAL LOW (ref >40)
LDL Cholesterol: 89 mg/dL (ref 0–99)
Total CHOL/HDL Ratio: 6.3 RATIO
Triglycerides: 187 mg/dL — ABNORMAL HIGH (ref <150)
VLDL: 37 mg/dL (ref 0–40)

## 2019-05-26 LAB — HEMOGLOBIN A1C
Hgb A1c MFr Bld: 7.4 % — ABNORMAL HIGH (ref 4.8–5.6)
Mean Plasma Glucose: 165.68 mg/dL

## 2019-06-01 ENCOUNTER — Ambulatory Visit: Payer: Self-pay | Admitting: Physician Assistant

## 2019-06-01 ENCOUNTER — Encounter: Payer: Self-pay | Admitting: Physician Assistant

## 2019-06-01 DIAGNOSIS — E119 Type 2 diabetes mellitus without complications: Secondary | ICD-10-CM

## 2019-06-01 DIAGNOSIS — I10 Essential (primary) hypertension: Secondary | ICD-10-CM

## 2019-06-01 DIAGNOSIS — F17200 Nicotine dependence, unspecified, uncomplicated: Secondary | ICD-10-CM

## 2019-06-01 DIAGNOSIS — E785 Hyperlipidemia, unspecified: Secondary | ICD-10-CM

## 2019-06-01 NOTE — Progress Notes (Signed)
There were no vitals taken for this visit.   Subjective:    Patient ID: Mark Phillips, male    DOB: [birth year], 48 y.o.   MRN: [id number]  HPI: Mark Phillips is a 48 y.o. male presenting on 06/01/2019 for No chief complaint on file.   HPI    This is a telemedicine appointment due to coronavirus pandemic.  It is via Telephone as pt does not have a video enable device.     I connected with  [name] on 06/01/19 by a video enabled telemedicine application and verified that I am speaking with the correct person using two identifiers.   I discussed the limitations of evaluation and management by telemedicine. The patient expressed understanding and agreed to proceed.   Pt is at home.  Provider is at office.    Pt is still working at [employer]   Pt has gained about 10 pounds.  Pt says food choices limited by covid 19.   He says things are getting back to normal now.    He is still smoking.   Pt says he is doing well, is feeling well, and he has no complaints     Relevant past medical, surgical, family and social history reviewed and updated as indicated. Interim medical history since our last visit reviewed. Allergies and medications reviewed and updated.   Current Outpatient Medications:  .  JANUVIA 100 MG tablet, TAKE 1 Tablet BY MOUTH ONCE DAILY, Disp: 90 tablet, Rfl: 0 .  metFORMIN (GLUCOPHAGE) 1000 MG tablet, TAKE 1 Tablet  BY MOUTH TWICE DAILY WITH A MEAL, Disp: 180 tablet, Rfl: 1 .  Omega-3 Fatty Acids (FISH OIL) 1200 MG CAPS, Take 2 capsules (2,400 mg total) by mouth daily., Disp: , Rfl:  .  atorvastatin (LIPITOR) 80 MG tablet, TAKE 1 Tablet BY MOUTH EVERY NIGHT AT BEDTIME (Patient not taking: Reported on 06/01/2019), Disp: 90 tablet, Rfl: 1 .  lisinopril (PRINIVIL,ZESTRIL) 20 MG tablet, TAKE 1 Tablet BY MOUTH ONCE DAILY, Disp: 90 tablet, Rfl: 1    Review of Systems  Per HPI unless specifically indicated above     Objective:    There were  no vitals taken for this visit.  Wt Readings from Last 3 Encounters:  11/24/18 185 lb (83.9 kg)  08/24/18 184 lb 8 oz (83.7 kg)  06/24/18 185 lb 8 oz (84.1 kg)    Physical Exam Constitutional:      General: He is not in acute distress. Pulmonary:     Effort: No respiratory distress.  Neurological:     Mental Status: He is alert and oriented to person, place, and time.  Psychiatric:        Attention and Perception: Attention normal.        Speech: Speech normal.        Behavior: Behavior is cooperative.        Results for orders placed or performed during the hospital encounter of 05/26/19  Lipid panel  Result Value Ref Range   Cholesterol 150 0 - 200 mg/dL   Triglycerides 187 (H) <150 mg/dL   HDL 24 (L) >40 mg/dL   Total CHOL/HDL Ratio 6.3 RATIO   VLDL 37 0 - 40 mg/dL   LDL Cholesterol 89 0 - 99 mg/dL  Comprehensive metabolic panel  Result Value Ref Range   Sodium 138 135 - 145 mmol/L   Potassium 4.5 3.5 - 5.1 mmol/L   Chloride 104 98 - 111 mmol/L   CO2  26 22 - 32 mmol/L   Glucose, Bld 191 (H) 70 - 99 mg/dL   BUN 12 6 - 20 mg/dL   Creatinine, Ser 0.75 0.61 - 1.24 mg/dL   Calcium 9.3 8.9 - 10.3 mg/dL   Total Protein 7.2 6.5 - 8.1 g/dL   Albumin 4.1 3.5 - 5.0 g/dL   AST 22 15 - 41 U/L   ALT 34 0 - 44 U/L   Alkaline Phosphatase 42 38 - 126 U/L   Total Bilirubin 0.7 0.3 - 1.2 mg/dL   GFR calc non Af Amer >60 >60 mL/min   GFR calc Af Amer >60 >60 mL/min   Anion gap 8 5 - 15  Hemoglobin A1c  Result Value Ref Range   Hgb A1c MFr Bld 7.4 (H) 4.8 - 5.6 %   Mean Plasma Glucose 165.68 mg/dL      Assessment & Plan:    Encounter Diagnoses  Name Primary?  . Diabetes mellitus without complication ([place]) Yes  . Essential hypertension   . Hyperlipidemia, unspecified hyperlipidemia type   . Tobacco use disorder      -reviewed labs with pt -Counseled on diet and exercise to help a1c.  If still high at next appointment, he will be restarted on insulin -no changes to  medications today -pt to Follow up 3 months.  He is to contact office sooner prn

## 2019-09-14 ENCOUNTER — Ambulatory Visit: Payer: Self-pay | Admitting: Physician Assistant

## 2019-09-23 ENCOUNTER — Ambulatory Visit: Payer: HRSA Program | Attending: Internal Medicine

## 2019-09-23 ENCOUNTER — Other Ambulatory Visit: Payer: Self-pay

## 2019-09-23 DIAGNOSIS — Z20822 Contact with and (suspected) exposure to covid-19: Secondary | ICD-10-CM

## 2019-09-23 DIAGNOSIS — Z20828 Contact with and (suspected) exposure to other viral communicable diseases: Secondary | ICD-10-CM | POA: Insufficient documentation

## 2019-09-24 LAB — NOVEL CORONAVIRUS, NAA: SARS-CoV-2, NAA: NOT DETECTED

## 2019-09-24 NOTE — Progress Notes (Signed)
Order(s) created erroneously. Erroneous order ID: [id number]  Order moved by: Brigitte Pulse  Order move date/time: 09/24/2019 3:19 PM  Source Patient: [id number]  Source Contact: 09/23/2019  Destination Patient: [id number]  Destination Contact: 09/23/2019

## 2019-09-24 NOTE — Progress Notes (Signed)
Order(s) created erroneously. Erroneous order ID: [id number]  Order moved by: Brigitte Pulse  Order move date/time: 09/24/2019 3:19 PM  Source Patient: [id number]  Source Contact: 09/23/2019  Destination Patient: [id number]  Destination Contact: 09/23/2019

## 2019-09-24 NOTE — Progress Notes (Signed)
Orders moved to this encounter.

## 2019-12-02 ENCOUNTER — Ambulatory Visit: Payer: Self-pay | Attending: Internal Medicine

## 2019-12-02 ENCOUNTER — Other Ambulatory Visit: Payer: Self-pay

## 2019-12-02 DIAGNOSIS — Z20822 Contact with and (suspected) exposure to covid-19: Secondary | ICD-10-CM | POA: Insufficient documentation

## 2019-12-03 LAB — NOVEL CORONAVIRUS, NAA: SARS-CoV-2, NAA: NOT DETECTED

## 2019-12-08 ENCOUNTER — Ambulatory Visit: Payer: Self-pay | Attending: Internal Medicine

## 2019-12-08 ENCOUNTER — Other Ambulatory Visit: Payer: Self-pay

## 2019-12-08 DIAGNOSIS — Z20822 Contact with and (suspected) exposure to covid-19: Secondary | ICD-10-CM | POA: Insufficient documentation

## 2019-12-09 ENCOUNTER — Encounter: Payer: Self-pay | Admitting: *Deleted

## 2019-12-09 LAB — NOVEL CORONAVIRUS, NAA: SARS-CoV-2, NAA: DETECTED — AB

## 2019-12-10 ENCOUNTER — Other Ambulatory Visit: Payer: Self-pay | Admitting: Internal Medicine

## 2019-12-10 ENCOUNTER — Ambulatory Visit (HOSPITAL_COMMUNITY)
Admission: RE | Admit: 2019-12-10 | Discharge: 2019-12-10 | Disposition: A | Discharge status: Home / Self Care | Payer: HRSA Program | Source: Ambulatory Visit | Attending: Pulmonary Disease | Admitting: Pulmonary Disease

## 2019-12-10 DIAGNOSIS — U071 COVID-19: Secondary | ICD-10-CM | POA: Insufficient documentation

## 2019-12-10 MED ORDER — SODIUM CHLORIDE 0.9 % IV SOLN: INTRAVENOUS | Status: DC | PRN

## 2019-12-10 MED ORDER — ALBUTEROL SULFATE HFA 108 (90 BASE) MCG/ACT IN AERS: 2.0000 | INHALATION_SPRAY | Freq: Once | RESPIRATORY_TRACT | Status: DC | PRN

## 2019-12-10 MED ORDER — EPINEPHRINE 0.3 MG/0.3ML IJ SOAJ: 0.3000 mg | Freq: Once | INTRAMUSCULAR | Status: DC | PRN

## 2019-12-10 MED ORDER — SODIUM CHLORIDE 0.9 % IV SOLN
700.0000 mg | Freq: Once | INTRAVENOUS | Status: AC
  Administered 2019-12-10: 700 mg via INTRAVENOUS
  Filled 2019-12-10: qty 20

## 2019-12-10 MED ORDER — DIPHENHYDRAMINE HCL 50 MG/ML IJ SOLN: 50.0000 mg | Freq: Once | INTRAMUSCULAR | Status: DC | PRN

## 2019-12-10 MED ORDER — FAMOTIDINE IN NACL 20-0.9 MG/50ML-% IV SOLN: 20.0000 mg | Freq: Once | INTRAVENOUS | Status: DC | PRN

## 2019-12-10 MED ORDER — METHYLPREDNISOLONE SODIUM SUCC 125 MG IJ SOLR: 125.0000 mg | Freq: Once | INTRAMUSCULAR | Status: DC | PRN

## 2019-12-10 NOTE — Discharge Instructions (Signed)

## 2019-12-10 NOTE — Progress Notes (Signed)
  Diagnosis: COVID-19  Physician: Dr. Joya Gaskins  Procedure: [facility] Infusion [facility] Med: bamlanivimab infusion - Provided patient with bamlanimivab fact sheet for patients, parents and caregivers prior to infusion.  Complications: No immediate complications noted.  Discharge: Discharged home   Mark Phillips 12/10/2019

## 2019-12-10 NOTE — Progress Notes (Signed)
  I connected by phone with [name] on 12/10/2019 at 9:06 AM to discuss the potential use of an new treatment for mild to moderate COVID-19 viral infection in non-hospitalized patients.  This patient is a 49 y.o. male that meets the FDA criteria for Emergency Use Authorization of bamlanivimab or casirivimab\imdevimab.  Has a (+) direct SARS-CoV-2 viral test result  Has mild or moderate COVID-19   Is ? 48 years of age and weighs ? 40 kg  Is NOT hospitalized due to COVID-19  Is NOT requiring oxygen therapy or requiring an increase in baseline oxygen flow rate due to COVID-19  Is within 10 days of symptom onset  Has at least one of the high risk factor(s) for progression to severe COVID-19 and/or hospitalization as defined in EUA.  Specific high risk criteria : Diabetes   I have spoken and communicated the following to the patient or parent/caregiver:  1. FDA has authorized the emergency use of bamlanivimab and casirivimab\imdevimab for the treatment of mild to moderate COVID-19 in adults and pediatric patients with positive results of direct SARS-CoV-2 viral testing who are 19 years of age and older weighing at least 40 kg, and who are at high risk for progressing to severe COVID-19 and/or hospitalization.  2. The significant known and potential risks and benefits of bamlanivimab and casirivimab\imdevimab, and the extent to which such potential risks and benefits are unknown.  3. Information on available alternative treatments and the risks and benefits of those alternatives, including clinical trials.  4. Patients treated with bamlanivimab and casirivimab\imdevimab should continue to self-isolate and use infection control measures (e.g., wear mask, isolate, social distance, avoid sharing personal items, clean and disinfect "high touch" surfaces, and frequent handwashing) according to CDC guidelines.   5. The patient or parent/caregiver has the option to accept or refuse  bamlanivimab or casirivimab\imdevimab .  After reviewing this information with the patient, The patient agreed to proceed with receiving the bamlanimivab infusion and will be provided a copy of the Fact sheet prior to receiving the infusion.   Infusion scheduled for 3/5 at 1430. Day 4 of symptoms.  Alan Ripper, NP-C [place]

## 2020-02-25 ENCOUNTER — Other Ambulatory Visit: Payer: Self-pay

## 2020-02-25 ENCOUNTER — Emergency Department (HOSPITAL_COMMUNITY): Payer: Self-pay

## 2020-02-25 ENCOUNTER — Emergency Department (HOSPITAL_COMMUNITY)
Admission: EM | Admit: 2020-02-25 | Discharge: 2020-02-25 | Disposition: A | Discharge status: Home / Self Care | Payer: Self-pay | Attending: Emergency Medicine | Admitting: Emergency Medicine

## 2020-02-25 ENCOUNTER — Encounter (HOSPITAL_COMMUNITY): Payer: Self-pay

## 2020-02-25 DIAGNOSIS — I10 Essential (primary) hypertension: Secondary | ICD-10-CM | POA: Insufficient documentation

## 2020-02-25 DIAGNOSIS — Z79899 Other long term (current) drug therapy: Secondary | ICD-10-CM | POA: Insufficient documentation

## 2020-02-25 DIAGNOSIS — F17210 Nicotine dependence, cigarettes, uncomplicated: Secondary | ICD-10-CM | POA: Insufficient documentation

## 2020-02-25 DIAGNOSIS — Z7984 Long term (current) use of oral hypoglycemic drugs: Secondary | ICD-10-CM | POA: Insufficient documentation

## 2020-02-25 DIAGNOSIS — K047 Periapical abscess without sinus: Secondary | ICD-10-CM | POA: Insufficient documentation

## 2020-02-25 DIAGNOSIS — E119 Type 2 diabetes mellitus without complications: Secondary | ICD-10-CM | POA: Insufficient documentation

## 2020-02-25 LAB — CBC WITH DIFFERENTIAL/PLATELET
Abs Immature Granulocytes: 0.04 10*3/uL (ref 0.00–0.07)
Basophils Absolute: 0.1 10*3/uL (ref 0.0–0.1)
Basophils Relative: 1 %
Eosinophils Absolute: 0.4 10*3/uL (ref 0.0–0.5)
Eosinophils Relative: 3 %
HCT: 46.2 % (ref 39.0–52.0)
Hemoglobin: 15.8 g/dL (ref 13.0–17.0)
Immature Granulocytes: 0 %
Lymphocytes Relative: 17 %
Lymphs Abs: 2.6 10*3/uL (ref 0.7–4.0)
MCH: 30.4 pg (ref 26.0–34.0)
MCHC: 34.2 g/dL (ref 30.0–36.0)
MCV: 88.8 fL (ref 80.0–100.0)
Monocytes Absolute: 1.3 10*3/uL — ABNORMAL HIGH (ref 0.1–1.0)
Monocytes Relative: 9 %
Neutro Abs: 10.8 10*3/uL — ABNORMAL HIGH (ref 1.7–7.7)
Neutrophils Relative %: 70 %
Platelets: 189 10*3/uL (ref 150–400)
RBC: 5.2 MIL/uL (ref 4.22–5.81)
RDW: 12.4 % (ref 11.5–15.5)
WBC: 15.2 10*3/uL — ABNORMAL HIGH (ref 4.0–10.5)
nRBC: 0 % (ref 0.0–0.2)

## 2020-02-25 LAB — BASIC METABOLIC PANEL
Anion gap: 12 (ref 5–15)
BUN: 7 mg/dL (ref 6–20)
CO2: 26 mmol/L (ref 22–32)
Calcium: 9.2 mg/dL (ref 8.9–10.3)
Chloride: 95 mmol/L — ABNORMAL LOW (ref 98–111)
Creatinine, Ser: 0.7 mg/dL (ref 0.61–1.24)
GFR calc Af Amer: >60 mL/min (ref >60)
GFR calc non Af Amer: >60 mL/min (ref >60)
Glucose, Bld: 334 mg/dL — ABNORMAL HIGH (ref 70–99)
Potassium: 5 mmol/L (ref 3.5–5.1)
Sodium: 133 mmol/L — ABNORMAL LOW (ref 135–145)

## 2020-02-25 MED ORDER — FENTANYL CITRATE (PF) 100 MCG/2ML IJ SOLN
50.0000 ug | Freq: Once | INTRAMUSCULAR | Status: AC
  Administered 2020-02-25: 50 ug via INTRAVENOUS
  Filled 2020-02-25: qty 2

## 2020-02-25 MED ORDER — IOHEXOL 300 MG/ML  SOLN
75.0000 mL | Freq: Once | INTRAMUSCULAR | Status: AC | PRN
  Administered 2020-02-25: 75 mL via INTRAVENOUS

## 2020-02-25 MED ORDER — SODIUM CHLORIDE 0.9 % IV BOLUS
1000.0000 mL | Freq: Once | INTRAVENOUS | Status: AC
  Administered 2020-02-25: 1000 mL via INTRAVENOUS

## 2020-02-25 MED ORDER — KETOROLAC TROMETHAMINE 30 MG/ML IJ SOLN
15.0000 mg | Freq: Once | INTRAMUSCULAR | Status: AC
  Administered 2020-02-25: 15 mg via INTRAVENOUS
  Filled 2020-02-25: qty 1

## 2020-02-25 MED ORDER — HYDROCODONE-ACETAMINOPHEN 5-325 MG PO TABS: 1.0000 | ORAL_TABLET | Freq: Four times a day (QID) | ORAL | 0 refills | Status: DC | PRN

## 2020-02-25 MED ORDER — CLINDAMYCIN PHOSPHATE 600 MG/50ML IV SOLN
600.0000 mg | Freq: Once | INTRAVENOUS | Status: AC
  Administered 2020-02-25: 600 mg via INTRAVENOUS
  Filled 2020-02-25: qty 50

## 2020-02-25 MED ORDER — AMOXICILLIN-POT CLAVULANATE 875-125 MG PO TABS
1.0000 | ORAL_TABLET | Freq: Two times a day (BID) | ORAL | 0 refills | Status: DC
Start: 2020-02-25 — End: 2023-12-07

## 2020-02-25 NOTE — ED Triage Notes (Signed)
Pt has hand an abscessed tooth for a while. He noticed last night that the right side of his throat began to swell, which is the same side as his abscessed tooth. He does not have a dentist. Throbbing pain with any movement.

## 2020-02-25 NOTE — Discharge Instructions (Addendum)
You are seen in the emergency department today for a dental infection.  Your CT scan showed findings of infection with spread into your neck.  You were given IV antibiotics and pain medications in the ER.  We discussed with oral surgeon Dr. Benson Norway, we would like you to go to his office at 8 AM Monday morning for a close follow-up appointment.  In the interim we are sending home with Augmentin for infection and Norco for pain.  Please take the antibiotic as prescribed.  You may take ibuprofen for initial pain control, discomfort is not likely please take the Norco.  -norco-this is a narcotic/controlled substance medication that has potential addicting qualities.  We recommend that you take 1-2 tablets every 6 hours as needed for severe pain.  Do not drive or operate heavy machinery when taking this medicine as it can be sedating. Do not drink alcohol or take other sedating medications when taking this medicine for safety reasons.  Keep this out of reach of small children.  Please be aware this medicine has Tylenol in it (325 mg/tab) do not exceed the maximum dose of Tylenol in a day per over the counter recommendations should you decide to supplement with Tylenol over the counter.   We have prescribed you new medication(s) today. Discuss the medications prescribed today with your pharmacist as they can have adverse effects and interactions with your other medicines including over the counter and prescribed medications. Seek medical evaluation if you start to experience new or abnormal symptoms after taking one of these medicines, seek care immediately if you start to experience difficulty breathing, feeling of your throat closing, facial swelling, or rash as these could be indications of a more serious allergic reaction  Follow-up as discussed above.  Return to the emergency department immediately for new or worsening symptoms including but not limited to spreading pain or swelling, pain/swelling beneath your  tongue, trouble swallowing, change in your voice, trouble opening your mouth, double breathing, fever, or any other concerns.

## 2020-02-25 NOTE — ED Notes (Signed)
Pt verbalized understanding of no driving and to use caution within 4 hours of taking pain meds due to meds cause drowsiness.  Instructed pt to take all of antibiotics as prescribed. 

## 2020-02-25 NOTE — ED Provider Notes (Signed)
[facility] EMERGENCY DEPARTMENT Provider Note   CSN: [id number] Arrival date & time: 02/25/20  0741     History Chief Complaint  Patient presents with  . Dental Pain    Mark Phillips is a 49 y.o. male with a history of hypertension, hyperlipidemia, T2DM, and sleep apnea who presents to the ED with complaints of R lower dental pain that has been intermittent for awhile now and worsened last night. Patient states pain is located to the most posterior molar of the right lower jaw and it radiates into the neck. Pain is associated with swelling. Sxs are worse with opening his mouth. No alleviating factors. He thinks he may have had a fever last night due to chills, did not take his temperature. Denies intra-oral drainage, pain/swelling beneath the tongue, dyspnea, vomiting, or voice change. He has not been taking his diabetes medications at home. He does not see a dentist regularly.   HPI     Past Medical History:  Diagnosis Date  . Essential hypertension   . GERD (gastroesophageal reflux disease)   . History of kidney stones   . Hyperlipidemia   . 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 PONV (postoperative nausea and vomiting)   . Sleep apnea   . Type 2 diabetes mellitus [facility])    diagnosed age 71    Patient Active Problem List   Diagnosis Date Noted  . Loose stools 08/28/2016  . Abdominal pain 08/28/2016  . BRBPR (bright red blood per rectum) 08/28/2016  . OSA (obstructive sleep apnea) 07/04/2016  . Tobacco user 07/04/2016  . Facial cellulitis 03/15/2014  . Pain in joint, shoulder region 04/08/2013  . Muscle weakness (generalized) 04/08/2013  . Right rotator cuff tear 02/25/2013  . Gout 08/27/2012  . Foot infection 08/27/2012  . Achilles bursitis or tendinitis 08/27/2012  . Cervicalgia 06/24/2011  . Cervical disc disorder with radiculopathy 06/12/2011    Past Surgical History:  Procedure Laterality Date  . ANKLE ARTHROSCOPY Right 2006  . ANKLE  RECONSTRUCTION Left    lt reconstruction and rt scope sx  . APPENDECTOMY  2000  . BIOPSY  09/19/2016   Procedure: BIOPSY;  Surgeon: Daneil Dolin, MD;  Location: [facility] SUITE;  Service: Endoscopy;;  colon  . COLON SURGERY  02/1999   removal of 8 inches due to perforation.  . COLONOSCOPY WITH PROPOFOL N/A 09/19/2016   Procedure: COLONOSCOPY WITH PROPOFOL;  Surgeon: Daneil Dolin, MD;  Location: [facility] END[facility] SUITE;  Service: Endoscopy;  Laterality: N/A;  930   . LUMBAR DISC SURGERY     July 2017 - Dr. Carloyn Manner  . SHOULDER ARTHROSCOPY WITH SUBACROMIAL DECOMPRESSION AND OPEN ROTATOR C Right 02/25/2013   Procedure: RIGHT SHOULDER ARTHROSCOPY WITH SUBACROMIAL DECOMPRESSION AND MINI OPEN ROTATOR CUFF REPAIR;  Surgeon: Johnn Hai, MD;  Location: [facility] ORS;  Service: Orthopedics;  Laterality: Right;  . [place] EXTRACTION  2011  . WISDOM TOOTH EXTRACTION Bilateral        Family History  Problem Relation Age of Onset  . Heart attack Mother   . Hypertension Mother   . Diabetes Mellitus II Mother   . COPD Father   . COPD Maternal Grandmother   . Colon cancer Maternal Grandmother   . Asthma Sister     Social History   Tobacco Use  . Smoking status: Current Every Day Smoker    Packs/day: 1.00    Years: 25.00    Pack years: 25.00  Types: Cigarettes    Start date: 06/13/1993  . Smokeless tobacco: Former Systems developer    Types: Chew    Quit date: 10/07/1993  Substance Use Topics  . Alcohol use: No  . Drug use: No    Home Medications Prior to Admission medications   Medication Sig Start Date End Date Taking? Authorizing Provider  atorvastatin (LIPITOR) 80 MG tablet TAKE 1 Tablet BY MOUTH EVERY NIGHT AT BEDTIME Patient not taking: Reported on 06/01/2019 01/06/19   Soyla Dryer, PA-C  JANUVIA 100 MG tablet TAKE 1 Tablet BY MOUTH ONCE DAILY 05/17/19   Soyla Dryer, PA-C  lisinopril (PRINIVIL,ZESTRIL) 20 MG tablet TAKE 1 Tablet BY MOUTH ONCE DAILY 01/06/19   Soyla Dryer, PA-C  metFORMIN  (GLUCOPHAGE) 1000 MG tablet TAKE 1 Tablet  BY MOUTH TWICE DAILY WITH A MEAL 01/06/19   Soyla Dryer, PA-C  Omega-3 Fatty Acids (FISH OIL) 1200 MG CAPS Take 2 capsules (2,400 mg total) by mouth daily. 04/29/18   Soyla Dryer, PA-C    Allergies    Albuterol  Review of Systems   Review of Systems  Constitutional: Positive for chills and fever (subjective).  HENT: Positive for dental problem and facial swelling. Negative for trouble swallowing and voice change.   Respiratory: Negative for shortness of breath.   Cardiovascular: Negative for chest pain.  Gastrointestinal: Negative for nausea and vomiting.  Musculoskeletal: Positive for neck pain.  Neurological: Negative for syncope.  All other systems reviewed and are negative.   Physical Exam Updated Vital Signs BP (!) 149/84 (BP Location: Left Arm)   Pulse 75   Temp 98.5 F (36.9 C) (Oral)   Resp 12   Ht 6' (1.829 m)   Wt 88.5 kg   SpO2 99%   BMI 26.45 kg/m   Physical Exam Vitals and nursing note reviewed.  Constitutional:      General: He is not in acute distress.    Appearance: He is well-developed. He is not toxic-appearing.  HENT:     Head: Normocephalic and atraumatic.     Right Ear: Tympanic membrane is not perforated, erythematous, retracted or bulging.     Left Ear: Tympanic membrane is not perforated, erythematous, retracted or bulging.     Nose: Nose normal.     Mouth/Throat:     Pharynx: Uvula midline. No oropharyngeal exudate or posterior oropharyngeal erythema.     Tonsils: No tonsillar exudate or tonsillar abscesses.      Comments: There is some very mild swelling noted to right lower jaw, no neck swelling noted. Patient is tender to palpation to right lower external jaw extending into the R anterior neck. No overlying skin changes. No palpable crepitus.  Posterior oropharynx is symmetric appearing. Patient tolerating own secretions without difficulty. No trismus. No drooling. No hot potato voice. No  swelling beneath the tongue, submandibular compartment is soft.  Eyes:     General:        Right eye: No discharge.        Left eye: No discharge.     Conjunctiva/sclera: Conjunctivae normal.  Cardiovascular:     Rate and Rhythm: Normal rate and regular rhythm.  Pulmonary:     Effort: Pulmonary effort is normal. No respiratory distress.     Breath sounds: Normal breath sounds. No wheezing, rhonchi or rales.  Abdominal:     General: There is no distension.     Palpations: Abdomen is soft.     Tenderness: There is no abdominal tenderness.  Musculoskeletal:  Cervical back: Neck supple. Tenderness present. No edema, erythema, rigidity or crepitus.  Lymphadenopathy:     Cervical: No cervical adenopathy.  Skin:    General: Skin is warm and dry.     Findings: No rash.  Neurological:     Mental Status: He is alert.     Comments: Clear speech.   Psychiatric:        Behavior: Behavior normal.        Thought Content: Thought content normal.     ED Results / Procedures / Treatments   Labs (all labs ordered are listed, but only abnormal results are displayed) Labs Reviewed  CBC WITH DIFFERENTIAL/PLATELET - Abnormal; Notable for the following components:      Result Value   WBC 15.2 (*)    Neutro Abs 10.8 (*)    Monocytes Absolute 1.3 (*)    All other components within normal limits  BASIC METABOLIC PANEL - Abnormal; Notable for the following components:   Sodium 133 (*)    Chloride 95 (*)    Glucose, Bld 334 (*)    All other components within normal limits    EKG None  Radiology CT Soft Tissue Neck W Contrast  Result Date: 02/25/2020 CLINICAL DATA:  Dental infection with pain/tenderness to right anterior neck; maxillary/facial abscess. Additional history provided: Patient reports dental infection, pain and tenderness to right anterior neck onset last night. EXAM: CT NECK WITH CONTRAST TECHNIQUE: Multidetector CT imaging of the neck was performed using the standard protocol  following the bolus administration of intravenous contrast. CONTRAST:  14mL OMNIPAQUE IOHEXOL 300 MG/ML  SOLN COMPARISON:  Maxillofacial CT 03/15/2014 FINDINGS: Pharynx and larynx: Carious posterior right mandibular molar with surrounding periapical lucency (series 5, image 34) (series 5, image 33). A subtle 10 mm soft tissue abscess is questioned immediately adjacent to this tooth along the medial margin of the mandible (series 4, image 40). There is prominent surrounding edema and inflammatory stranding within the right floor of mouth. Edema and inflammatory stranding also extends into the right submandibular space and upper neck. There is also swelling and edema of the right pharynx, right aspect of the epiglottis and right aryepiglottic fold (for instance as seen on series 5, image 46). The oropharyngeal airway remains patent. The larynx is unremarkable. Salivary glands: There is swelling and edema of the right submandibular gland with surrounding inflammatory stranding. The parotid and left submandibular glands are unremarkable. Thyroid: Unremarkable. Lymph nodes: Mild prominence of upper cervical chain lymph nodes, right greater than left, likely reactive. Vascular: The major vascular structures of the neck are patent. Limited intracranial: No abnormality identified. Visualized orbits: Incompletely imaged. Visualized orbits show no acute finding. Mastoids and visualized paranasal sinuses: No significant paranasal sinus disease or mastoid effusion at the imaged levels. Skeleton: No acute bony abnormality or aggressive osseous lesion. Congenital fusion of the C2-C3 vertebrae. Cervical spondylosis with multilevel disc space narrowing, posterior disc osteophytes and uncovertebral hypertrophy. Upper chest: No consolidation within the imaged lung apices. IMPRESSION: Carious posterior right mandibular molar with surrounding periapical lucency. A subtle 10 mm soft tissue abscess is questioned immediately adjacent to  this tooth along the medial margin of the mandible. Associated soft tissue infection within the right floor of mouth and extending to the right submandibular space. Additionally, there is edema of the right pharynx, right aspect of the epiglottis and right aryepiglottic fold. The oropharyngeal airway remains patent. Electronically Signed   By: Kellie Simmering DO   On: 02/25/2020 09:56  Procedures Procedures (including critical care time)  Medications Ordered in ED Medications - No data to display  ED Course  I have reviewed the triage vital signs and the nursing notes.  Pertinent labs & imaging results that were available during my care of the patient were reviewed by me and considered in my medical decision making (see chart for details).    MDM Rules/Calculators/A&P                     Patient presents to the ED with complaints of dental discomfort with swelling & radiation into the neck. Nontoxic, vitals WNL with the exception of elevated BP- doubt HTN emergency at this time..  Additional history obtained:  Additional history obtained from review of nursing notes & chart review.   ED Course:  08:15: Initial evaluation of patient, tooth #32 is decayed- surrounding gingiva is erythematous & swollen with tenderness to palpation, also tender to the right lower jaw extending to the right anterior neck. No signs of respiratory distress, airway patent, no stridor.   DDx: Dental infection/abscess, deep space infection- RPA, ludwigs.   I have ordered fentanyl for pain, clindamycin to initiate abx for infection, and fluids for maintenance as well as anticipated hyperglycemia given patient has not been taking his diabetes medications.    Lab Tests:  I Ordered, reviewed, and interpreted labs, which included:  CBC: Leukocytosis at 15.2 with left shift.  No anemia. BMP: Hyperglycemia without acidosis or anion gap elevation.  Mild hyponatremia/hypochloremia.  Imaging Studies ordered:  I ordered  imaging studies which included CT soft tissue neck, I independently visualized and interpreted imaging which showed Carious posterior right mandibular molar with surrounding periapical lucency. A subtle 10 mm soft tissue abscess is questioned immediately adjacent to this tooth along the medial margin of the mandible. Associated soft tissue infection within the right floor of mouth and extending to the right submandibular space. Additionally, there is edema of the right pharynx, right aspect of the epiglottis and right aryepiglottic fold. The oropharyngeal airway remains patent.  Will discuss with oral surgery on call.   10:50: CONSULT: Discussed with oral surgeon Dr. Raynelle Dick staff as he is in surgery, will call back.  Multiple discussions with Jerene Pitch- office staff at Dr. Raynelle Dick clinic, faxed CT information with patient's consent, this was relayed to Dr. Benson Norway, recommendation for discharge home on antibiotics, will see in clinic first thing Monday morning at 8 AM.  Patient is feeling improved status post analgesics in the emergency department.  He remains with patent airway and is tolerating his own secretions without difficulty.  Will discharge home with Augmentin as well as Norco for pain control.  Discussed need for close follow-up.  We will also provide refill of his Metformin and Januvia as he states he ran out.  We had extensive discussion regarding very strict return precautions. I discussed results, treatment plan, need for follow-up, and return precautions with the patient. Provided opportunity for questions, patient confirmed understanding and is in agreement with plan.   Findings and plan of care discussed with supervising physician Dr. Vanita Panda who is in agreement.   Portions of this note were generated with Lobby[employer]. Dictation errors may occur despite best attempts at proofreading.   Final Clinical Impression(s) / ED Diagnoses Final diagnoses:  Dental infection     Rx / DC Orders ED Discharge Orders         Ordered    amoxicillin-clavulanate (AUGMENTIN) 875-125 MG tablet  Every 12  hours     02/25/20 1241    HYDROcodone-acetaminophen (NORCO/VICODIN) 5-325 MG tablet  Every 6 hours PRN     02/25/20 1241           Nashanti Duquette, Glynda Jaeger, PA-C 02/25/20 1241    Carmin Muskrat, MD 02/25/20 1549

## 2020-12-10 ENCOUNTER — Emergency Department (HOSPITAL_COMMUNITY)
Admission: EM | Admit: 2020-12-10 | Discharge: 2020-12-10 | Disposition: A | Discharge status: Home / Self Care | Payer: Self-pay | Attending: Emergency Medicine | Admitting: Emergency Medicine

## 2020-12-10 ENCOUNTER — Other Ambulatory Visit: Payer: Self-pay

## 2020-12-10 ENCOUNTER — Encounter (HOSPITAL_COMMUNITY): Payer: Self-pay

## 2020-12-10 DIAGNOSIS — E119 Type 2 diabetes mellitus without complications: Secondary | ICD-10-CM | POA: Insufficient documentation

## 2020-12-10 DIAGNOSIS — M5431 Sciatica, right side: Secondary | ICD-10-CM | POA: Insufficient documentation

## 2020-12-10 DIAGNOSIS — I10 Essential (primary) hypertension: Secondary | ICD-10-CM | POA: Insufficient documentation

## 2020-12-10 DIAGNOSIS — M5430 Sciatica, unspecified side: Secondary | ICD-10-CM

## 2020-12-10 DIAGNOSIS — F17210 Nicotine dependence, cigarettes, uncomplicated: Secondary | ICD-10-CM | POA: Insufficient documentation

## 2020-12-10 DIAGNOSIS — Z79899 Other long term (current) drug therapy: Secondary | ICD-10-CM | POA: Insufficient documentation

## 2020-12-10 DIAGNOSIS — Z7984 Long term (current) use of oral hypoglycemic drugs: Secondary | ICD-10-CM | POA: Insufficient documentation

## 2020-12-10 MED ORDER — PREDNISONE 20 MG PO TABS: 20.0000 mg | ORAL_TABLET | Freq: Every day | ORAL | 0 refills | Status: AC

## 2020-12-10 MED ORDER — METHOCARBAMOL 500 MG PO TABS
500.0000 mg | ORAL_TABLET | Freq: Two times a day (BID) | ORAL | 0 refills | Status: DC
Start: 2020-12-10 — End: 2023-12-07

## 2020-12-10 NOTE — ED Triage Notes (Signed)
Pt to er, pt states that he has pain that is going from the top of his R buttock down his R leg, states that she has a hx of sciatica, states that this episodes of pain started about three days ago.  Pt ambulatory to triage.

## 2020-12-10 NOTE — ED Notes (Signed)
ED Provider at bedside. 

## 2020-12-10 NOTE — Discharge Instructions (Signed)
You were given a prescription for Robaxin which is a muscle relaxer.  You should not drive, work, or operate machinery while taking this medication as it can make you very drowsy.    Take prednisone as prescribed. Watch your blood sugars closely while taking this medication.   Please follow up with your primary care provider within 5-7 days for re-evaluation of your symptoms. If you do not have a primary care provider, information for a healthcare clinic has been provided for you to make arrangements for follow up care.   Return to the emergency department immediately if you experience any back pain associated with fevers, loss of control of your bowels/bladder, weakness/numbness to your legs, numbness to your groin area, inability to walk, or inability to urinate.

## 2020-12-10 NOTE — ED Notes (Signed)
Pt with sciatica pain since November but worst it's ever been starting today.  Pt states he took 600 mg Ibuprofen this morning without relief.

## 2020-12-10 NOTE — ED Provider Notes (Signed)
[facility] Provider Note   CSN: [id number] Arrival date & time: 12/10/20  1613     History Chief Complaint  Patient presents with  . Leg Pain    Mark Phillips is a 50 y.o. male.  HPI   50 year old male with a history of hypertension, GERD, nephrolithiasis, hyperlipidemia, lumbar disc disease, sleep apnea, diabetes, who presents the emergency department today for evaluation of right buttock pain.  States he has a history of sciatica that has been flaring up on and off for the last several months.  For the last few days the pain has been constant and severe in nature located to the right buttock and radiating down the right lower extremity.  He denies any associated paresthesias, numbness, weakness.  No saddle anesthesia noted.  No loss control bowel or bladder function.  Denies history of cancer or IVDU.  No fevers reported.  Has been ambulatory.  Previously symptoms improved with ibuprofen however they are no longer improving with this medication.  Pain is exacerbated with sitting and certain positions.  Past Medical History:  Diagnosis Date  . Essential hypertension   . GERD (gastroesophageal reflux disease)   . History of kidney stones   . Hyperlipidemia   . 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 PONV (postoperative nausea and vomiting)   . Sleep apnea   . Type 2 diabetes mellitus [facility])    diagnosed age 47    Patient Active Problem List   Diagnosis Date Noted  . Loose stools 08/28/2016  . Abdominal pain 08/28/2016  . BRBPR (bright red blood per rectum) 08/28/2016  . OSA (obstructive sleep apnea) 07/04/2016  . Tobacco user 07/04/2016  . Facial cellulitis 03/15/2014  . Pain in joint, shoulder region 04/08/2013  . Muscle weakness (generalized) 04/08/2013  . Right rotator cuff tear 02/25/2013  . Gout 08/27/2012  . Foot infection 08/27/2012  . Achilles bursitis or tendinitis 08/27/2012  . Cervicalgia 06/24/2011  . Cervical disc disorder  with radiculopathy 06/12/2011    Past Surgical History:  Procedure Laterality Date  . ANKLE ARTHROSCOPY Right 2006  . ANKLE RECONSTRUCTION Left    lt reconstruction and rt scope sx  . APPENDECTOMY  2000  . BIOPSY  09/19/2016   Procedure: BIOPSY;  Surgeon: Daneil Dolin, MD;  Location: [facility];  Service: Endoscopy;;  colon  . COLON SURGERY  02/1999   removal of 8 inches due to perforation.  . COLONOSCOPY WITH PROPOFOL N/A 09/19/2016   Procedure: COLONOSCOPY WITH PROPOFOL;  Surgeon: Daneil Dolin, MD;  Location: [facility];  Service: Endoscopy;  Laterality: N/A;  930   . LUMBAR DISC SURGERY     July 2017 - Dr. Carloyn Manner  . SHOULDER ARTHROSCOPY WITH SUBACROMIAL DECOMPRESSION AND OPEN ROTATOR C Right 02/25/2013   Procedure: RIGHT SHOULDER ARTHROSCOPY WITH SUBACROMIAL DECOMPRESSION AND MINI OPEN ROTATOR CUFF REPAIR;  Surgeon: Johnn Hai, MD;  Location: [facility];  Service: Orthopedics;  Laterality: Right;  . [place] EXTRACTION  2011  . WISDOM TOOTH EXTRACTION Bilateral        Family History  Problem Relation Age of Onset  . Heart attack Mother   . Hypertension Mother   . Diabetes Mellitus II Mother   . COPD Father   . COPD Maternal Grandmother   . Colon cancer Maternal Grandmother   . Asthma Sister     Social History   Tobacco Use  . Smoking status: Current Every Day  Smoker    Packs/day: 1.00    Years: 25.00    Pack years: 25.00    Types: Cigarettes    Start date: 06/13/1993  . Smokeless tobacco: Former Systems developer    Types: [place] date: 10/07/1993  Vaping Use  . Vaping Use: Never used  Substance Use Topics  . Alcohol use: No  . Drug use: No    Home Medications Prior to Admission medications   Medication Sig Start Date End Date Taking? Authorizing Provider  methocarbamol (ROBAXIN) 500 MG tablet Take 1 tablet (500 mg total) by mouth 2 (two) times daily. 12/10/20  Yes Couture, Cortni S, PA-C  predniSONE (DELTASONE) 20 MG tablet Take 1 tablet (20 mg total) by  mouth daily for 3 days. 12/10/20 12/13/20 Yes Couture, Cortni S, PA-C  amoxicillin-clavulanate (AUGMENTIN) 875-125 MG tablet Take 1 tablet by mouth every 12 (twelve) hours. 02/25/20   Petrucelli, Samantha R, PA-C  atorvastatin (LIPITOR) 80 MG tablet TAKE 1 Tablet BY MOUTH EVERY NIGHT AT BEDTIME Patient not taking: Reported on 06/01/2019 01/06/19   Soyla Dryer, PA-C  HYDROcodone-acetaminophen (NORCO/VICODIN) 5-325 MG tablet Take 1-2 tablets by mouth every 6 (six) hours as needed. 02/25/20   Petrucelli, Glynda Jaeger, PA-C  JANUVIA 100 MG tablet TAKE 1 Tablet BY MOUTH ONCE DAILY 05/17/19   Soyla Dryer, PA-C  lisinopril (PRINIVIL,ZESTRIL) 20 MG tablet TAKE 1 Tablet BY MOUTH ONCE DAILY 01/06/19   Soyla Dryer, PA-C  metFORMIN (GLUCOPHAGE) 1000 MG tablet TAKE 1 Tablet  BY MOUTH TWICE DAILY WITH A MEAL 01/06/19   Soyla Dryer, PA-C  Omega-3 Fatty Acids (FISH OIL) 1200 MG CAPS Take 2 capsules (2,400 mg total) by mouth daily. 04/29/18   Soyla Dryer, PA-C    Allergies    Albuterol  Review of Systems   Review of Systems  Constitutional: Negative for fever.  Respiratory: Negative for shortness of breath.   Genitourinary: Negative for flank pain.       No loss of control of bowel or bladder function  Musculoskeletal: Positive for back pain.  Skin: Negative for color change and rash.  Neurological: Negative for weakness and numbness.  All other systems reviewed and are negative.   Physical Exam Updated Vital Signs BP (!) 182/111 (BP Location: Right Arm)   Pulse 83   Temp 98.4 F (36.9 C) (Oral)   Resp 18   Ht 5\' 11"  (1.803 m)   Wt 79.8 kg   SpO2 99%   BMI 24.55 kg/m   Physical Exam Vitals and nursing note reviewed.  Constitutional:      Appearance: He is well-developed and well-nourished.  HENT:     Head: Normocephalic and atraumatic.  Eyes:     Conjunctiva/sclera: Conjunctivae normal.  Cardiovascular:     Rate and Rhythm: Normal rate and regular rhythm.     Heart sounds: No  murmur heard.   Pulmonary:     Effort: Pulmonary effort is normal. No respiratory distress.     Breath sounds: Normal breath sounds.  Abdominal:     Palpations: Abdomen is soft.     Tenderness: There is no abdominal tenderness.  Musculoskeletal:        General: No edema.     Cervical back: Neck supple.     Comments: No TTP to the lumbar spine. TTP noted to the right buttock over the sciatic notch. 5/5 strength  To the BLE with normal sensation throughout. DP pulses 2+ and symmetric.   Skin:    General: Skin  is warm and dry.  Neurological:     Mental Status: He is alert.  Psychiatric:        Mood and Affect: Mood and affect normal.     ED Results / Procedures / Treatments   Labs (all labs ordered are listed, but only abnormal results are displayed) Labs Reviewed - No data to display  EKG None  Radiology No results found.  Procedures Procedures   Medications Ordered in ED Medications - No data to display  ED Course  I have reviewed the triage vital signs and the nursing notes.  Pertinent labs & imaging results that were available during my care of the patient were reviewed by me and considered in my medical decision making (see chart for details).    MDM Rules/Calculators/A&P                          Normal neurological exam, no evidence of urinary incontinence or retention, pain is consistently reproducible. There is no evidence of AAA or concern for dissection at this time.   Patient can walk but states is painful.  No loss of bowel or bladder control.  No concern for cauda equina.  No fever, night sweats, weight loss, h/o cancer, IVDU.  Pain treated here in the department with adequate improvement. RICE protocol and pain medicine indicated and discussed with patient. I have also discussed reasons to return immediately to the ER.  Patient expresses understanding and agrees with plan.  Final Clinical Impression(s) / ED Diagnoses Final diagnoses:  Sciatica,  unspecified laterality    Rx / DC Orders ED Discharge Orders         Ordered    predniSONE (DELTASONE) 20 MG tablet  Daily        12/10/20 1842    methocarbamol (ROBAXIN) 500 MG tablet  2 times daily        12/10/20 1842           Couture, Sara [employer], PA-C 12/10/20 1842    Fredia Sorrow, MD 12/14/20 [phone]

## 2023-12-07 ENCOUNTER — Emergency Department (HOSPITAL_COMMUNITY): Payer: Self-pay

## 2023-12-07 ENCOUNTER — Inpatient Hospital Stay (HOSPITAL_COMMUNITY)
Admission: EM | Admit: 2023-12-07 | Discharge: 2023-12-09 | DRG: 254 | Disposition: A | Discharge status: Home / Self Care | Payer: Self-pay | Attending: Internal Medicine | Admitting: Internal Medicine

## 2023-12-07 ENCOUNTER — Encounter (HOSPITAL_COMMUNITY): Payer: Self-pay | Admitting: *Deleted

## 2023-12-07 ENCOUNTER — Other Ambulatory Visit: Payer: Self-pay

## 2023-12-07 DIAGNOSIS — E1169 Type 2 diabetes mellitus with other specified complication: Secondary | ICD-10-CM

## 2023-12-07 DIAGNOSIS — R509 Fever, unspecified: Secondary | ICD-10-CM | POA: Diagnosis present

## 2023-12-07 DIAGNOSIS — E118 Type 2 diabetes mellitus with unspecified complications: Secondary | ICD-10-CM

## 2023-12-07 DIAGNOSIS — I739 Peripheral vascular disease, unspecified: Secondary | ICD-10-CM | POA: Diagnosis present

## 2023-12-07 DIAGNOSIS — I75021 Atheroembolism of right lower extremity: Secondary | ICD-10-CM | POA: Diagnosis present

## 2023-12-07 DIAGNOSIS — Z79899 Other long term (current) drug therapy: Secondary | ICD-10-CM

## 2023-12-07 DIAGNOSIS — I70211 Atherosclerosis of native arteries of extremities with intermittent claudication, right leg: Secondary | ICD-10-CM | POA: Diagnosis present

## 2023-12-07 DIAGNOSIS — Z720 Tobacco use: Secondary | ICD-10-CM | POA: Diagnosis present

## 2023-12-07 DIAGNOSIS — I700 Atherosclerosis of aorta: Secondary | ICD-10-CM | POA: Diagnosis present

## 2023-12-07 DIAGNOSIS — Z8249 Family history of ischemic heart disease and other diseases of the circulatory system: Secondary | ICD-10-CM

## 2023-12-07 DIAGNOSIS — R230 Cyanosis: Principal | ICD-10-CM

## 2023-12-07 DIAGNOSIS — E1159 Type 2 diabetes mellitus with other circulatory complications: Secondary | ICD-10-CM | POA: Diagnosis present

## 2023-12-07 DIAGNOSIS — Z800 Family history of malignant neoplasm of digestive organs: Secondary | ICD-10-CM

## 2023-12-07 DIAGNOSIS — E119 Type 2 diabetes mellitus without complications: Secondary | ICD-10-CM

## 2023-12-07 DIAGNOSIS — Z825 Family history of asthma and other chronic lower respiratory diseases: Secondary | ICD-10-CM

## 2023-12-07 DIAGNOSIS — I152 Hypertension secondary to endocrine disorders: Secondary | ICD-10-CM | POA: Diagnosis present

## 2023-12-07 DIAGNOSIS — Z87442 Personal history of urinary calculi: Secondary | ICD-10-CM

## 2023-12-07 DIAGNOSIS — I708 Atherosclerosis of other arteries: Secondary | ICD-10-CM | POA: Diagnosis present

## 2023-12-07 DIAGNOSIS — D72829 Elevated white blood cell count, unspecified: Secondary | ICD-10-CM | POA: Diagnosis present

## 2023-12-07 DIAGNOSIS — Z7984 Long term (current) use of oral hypoglycemic drugs: Secondary | ICD-10-CM

## 2023-12-07 DIAGNOSIS — E785 Hyperlipidemia, unspecified: Secondary | ICD-10-CM | POA: Diagnosis present

## 2023-12-07 DIAGNOSIS — I10 Essential (primary) hypertension: Secondary | ICD-10-CM | POA: Diagnosis present

## 2023-12-07 DIAGNOSIS — E1151 Type 2 diabetes mellitus with diabetic peripheral angiopathy without gangrene: Principal | ICD-10-CM | POA: Diagnosis present

## 2023-12-07 DIAGNOSIS — G4733 Obstructive sleep apnea (adult) (pediatric): Secondary | ICD-10-CM | POA: Diagnosis present

## 2023-12-07 DIAGNOSIS — F17210 Nicotine dependence, cigarettes, uncomplicated: Secondary | ICD-10-CM | POA: Diagnosis present

## 2023-12-07 DIAGNOSIS — M79674 Pain in right toe(s): Secondary | ICD-10-CM | POA: Diagnosis present

## 2023-12-07 DIAGNOSIS — I70209 Unspecified atherosclerosis of native arteries of extremities, unspecified extremity: Secondary | ICD-10-CM

## 2023-12-07 DIAGNOSIS — I7772 Dissection of iliac artery: Secondary | ICD-10-CM

## 2023-12-07 DIAGNOSIS — Z888 Allergy status to other drugs, medicaments and biological substances status: Secondary | ICD-10-CM

## 2023-12-07 DIAGNOSIS — Z833 Family history of diabetes mellitus: Secondary | ICD-10-CM

## 2023-12-07 LAB — PROTIME-INR
INR: 0.9 (ref 0.8–1.2)
Prothrombin Time: 12.6 s (ref 11.4–15.2)

## 2023-12-07 LAB — TROPONIN I (HIGH SENSITIVITY)
Troponin I (High Sensitivity): 30 ng/L — ABNORMAL HIGH (ref <18)
Troponin I (High Sensitivity): 32 ng/L — ABNORMAL HIGH (ref <18)

## 2023-12-07 LAB — CBC WITH DIFFERENTIAL/PLATELET
Abs Immature Granulocytes: 0.04 10*3/uL (ref 0.00–0.07)
Basophils Absolute: 0.1 10*3/uL (ref 0.0–0.1)
Basophils Relative: 1 %
Eosinophils Absolute: 0.7 10*3/uL — ABNORMAL HIGH (ref 0.0–0.5)
Eosinophils Relative: 5 %
HCT: 41.6 % (ref 39.0–52.0)
Hemoglobin: 14.1 g/dL (ref 13.0–17.0)
Immature Granulocytes: 0 %
Lymphocytes Relative: 29 %
Lymphs Abs: 3.7 10*3/uL (ref 0.7–4.0)
MCH: 30 pg (ref 26.0–34.0)
MCHC: 33.9 g/dL (ref 30.0–36.0)
MCV: 88.5 fL (ref 80.0–100.0)
Monocytes Absolute: 1 10*3/uL (ref 0.1–1.0)
Monocytes Relative: 8 %
Neutro Abs: 7.5 10*3/uL (ref 1.7–7.7)
Neutrophils Relative %: 57 %
Platelets: 199 10*3/uL (ref 150–400)
RBC: 4.7 MIL/uL (ref 4.22–5.81)
RDW: 13.2 % (ref 11.5–15.5)
WBC: 12.9 10*3/uL — ABNORMAL HIGH (ref 4.0–10.5)
nRBC: 0 % (ref 0.0–0.2)

## 2023-12-07 LAB — BASIC METABOLIC PANEL
Anion gap: 13 (ref 5–15)
BUN: 8 mg/dL (ref 6–20)
CO2: 26 mmol/L (ref 22–32)
Calcium: 9.3 mg/dL (ref 8.9–10.3)
Chloride: 98 mmol/L (ref 98–111)
Creatinine, Ser: 0.75 mg/dL (ref 0.61–1.24)
GFR, Estimated: >60 mL/min (ref >60)
Glucose, Bld: 236 mg/dL — ABNORMAL HIGH (ref 70–99)
Potassium: 3.6 mmol/L (ref 3.5–5.1)
Sodium: 137 mmol/L (ref 135–145)

## 2023-12-07 LAB — APTT: aPTT: 24 s (ref 24–36)

## 2023-12-07 LAB — GLUCOSE, CAPILLARY: Glucose-Capillary: 143 mg/dL — ABNORMAL HIGH (ref 70–99)

## 2023-12-07 LAB — LACTIC ACID, PLASMA: Lactic Acid, Venous: 1 mmol/L (ref 0.5–1.9)

## 2023-12-07 MED ORDER — POLYETHYLENE GLYCOL 3350 17 G PO PACK: 17.0000 g | PACK | Freq: Every day | ORAL | Status: DC | PRN

## 2023-12-07 MED ORDER — HEPARIN BOLUS VIA INFUSION
4400.0000 [IU] | Freq: Once | INTRAVENOUS | Status: AC
  Administered 2023-12-07: 4400 [IU] via INTRAVENOUS

## 2023-12-07 MED ORDER — ACETAMINOPHEN 650 MG RE SUPP: 650.0000 mg | Freq: Four times a day (QID) | RECTAL | Status: DC | PRN

## 2023-12-07 MED ORDER — ASPIRIN 81 MG PO CHEW
81.0000 mg | CHEWABLE_TABLET | Freq: Once | ORAL | Status: AC
  Administered 2023-12-07: 81 mg via ORAL
  Filled 2023-12-07: qty 1

## 2023-12-07 MED ORDER — ONDANSETRON HCL 4 MG/2ML IJ SOLN: 4.0000 mg | Freq: Four times a day (QID) | INTRAMUSCULAR | Status: DC | PRN

## 2023-12-07 MED ORDER — SODIUM CHLORIDE 0.9 % IV SOLN: INTRAVENOUS | Status: DC

## 2023-12-07 MED ORDER — ATORVASTATIN CALCIUM 40 MG PO TABS
40.0000 mg | ORAL_TABLET | Freq: Every day | ORAL | Status: DC
Start: 2023-12-08 — End: 2023-12-10
  Administered 2023-12-08: 40 mg via ORAL
  Filled 2023-12-07: qty 1

## 2023-12-07 MED ORDER — ONDANSETRON HCL 4 MG PO TABS: 4.0000 mg | ORAL_TABLET | Freq: Four times a day (QID) | ORAL | Status: DC | PRN

## 2023-12-07 MED ORDER — ASPIRIN 81 MG PO TBEC
81.0000 mg | DELAYED_RELEASE_TABLET | Freq: Every day | ORAL | Status: DC
  Administered 2023-12-08: 81 mg via ORAL
  Filled 2023-12-07: qty 1

## 2023-12-07 MED ORDER — ACETAMINOPHEN 325 MG PO TABS: 650.0000 mg | ORAL_TABLET | Freq: Four times a day (QID) | ORAL | Status: DC | PRN

## 2023-12-07 MED ORDER — SODIUM CHLORIDE 0.9 % IV BOLUS
1000.0000 mL | Freq: Once | INTRAVENOUS | Status: AC
  Administered 2023-12-07: 1000 mL via INTRAVENOUS

## 2023-12-07 MED ORDER — OXYCODONE-ACETAMINOPHEN 5-325 MG PO TABS
1.0000 | ORAL_TABLET | Freq: Once | ORAL | Status: AC
  Administered 2023-12-07: 1 via ORAL
  Filled 2023-12-07: qty 1

## 2023-12-07 MED ORDER — INSULIN ASPART 100 UNIT/ML IJ SOLN
0.0000 [IU] | Freq: Four times a day (QID) | INTRAMUSCULAR | Status: DC
  Administered 2023-12-07 – 2023-12-08 (×2): 2 [IU] via SUBCUTANEOUS

## 2023-12-07 MED ORDER — ONDANSETRON HCL 4 MG/2ML IJ SOLN
4.0000 mg | Freq: Once | INTRAMUSCULAR | Status: AC
  Administered 2023-12-07: 4 mg via INTRAVENOUS
  Filled 2023-12-07: qty 2

## 2023-12-07 MED ORDER — IOHEXOL 350 MG/ML SOLN
125.0000 mL | Freq: Once | INTRAVENOUS | Status: AC | PRN
  Administered 2023-12-07: 125 mL via INTRAVENOUS

## 2023-12-07 MED ORDER — SODIUM CHLORIDE 0.9 % IV SOLN
2.0000 g | INTRAVENOUS | Status: DC
  Administered 2023-12-08 (×2): 2 g via INTRAVENOUS
  Filled 2023-12-07 (×3): qty 20

## 2023-12-07 MED ORDER — HEPARIN (PORCINE) 25000 UT/250ML-% IV SOLN
2000.0000 [IU]/h | INTRAVENOUS | Status: DC
  Administered 2023-12-07: 1200 [IU]/h via INTRAVENOUS
  Administered 2023-12-08: 1500 [IU]/h via INTRAVENOUS
  Administered 2023-12-08 – 2023-12-09 (×2): 2000 [IU]/h via INTRAVENOUS
  Filled 2023-12-07 (×4): qty 250

## 2023-12-07 MED ORDER — MORPHINE SULFATE (PF) 4 MG/ML IV SOLN
4.0000 mg | Freq: Once | INTRAVENOUS | Status: AC
  Administered 2023-12-07: 4 mg via INTRAVENOUS
  Filled 2023-12-07: qty 1

## 2023-12-07 NOTE — Assessment & Plan Note (Addendum)
 With pain and discoloration. Leukocytosis of 12.9.  Tmax of 99.9.  With heart rate 82-96.  Normal lactic acid of 1. 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  Focal high-grade stenosis within mid right SFA greater than 70% - EDP talked to vascular surgeon Dr. Myra Gianotti, recommended admission to [facility], okay to continue heparin for possible embolic process, aspirin, statin and for angio 3/4, ABIs ordered, n.p.o. midnight tomorrow. -IV ceftriaxone 2 g daily -Obtain blood cultures -Lipid panel -Atorvastatin 40 mg daily - 1 L bolus, cont N/s 100cc/hr x 15 hrs

## 2023-12-07 NOTE — Plan of Care (Signed)
   Problem: Education: Goal: Knowledge of General Education information will improve Description Including pain rating scale, medication(s)/side effects and non-pharmacologic comfort measures Outcome: Progressing   Problem: Health Behavior/Discharge Planning: Goal: Ability to manage health-related needs will improve Outcome: Progressing

## 2023-12-07 NOTE — Consult Note (Incomplete)
 [facility]  Patient name: Mark Phillips MRN: [id number] DOB: [birth year] Sex: male   REQUESTING PROVIDER:    ER   REASON FOR CONSULT:    Right Toe ischemia  HISTORY OF PRESENT ILLNESS:   Mark Phillips is a 53 y.o. 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   PONV (postoperative nausea and vomiting)    Sleep apnea    Type 2 diabetes mellitus (HCC)    diagnosed age 46     FAMILY HISTORY   Family History  Problem Relation Age of Onset   Heart attack Mother    Hypertension Mother    Diabetes Mellitus II Mother    COPD Father    COPD Maternal Grandmother    Colon cancer Maternal Grandmother    Asthma Sister     SOCIAL HISTORY:   Social History   Socioeconomic History   Marital status: Married    Spouse name: Not on file   Number of children: Not on file   Years of education: Not on file   Highest education level: Not on file  Occupational History   Not on file  Tobacco Use   Smoking status: Every Day    Current packs/day: 1.00    Average packs/day: 1 pack/day for 30.5 years (30.5 ttl pk-yrs)    Types: Cigarettes    Start date: 06/13/1993   Smokeless tobacco: Former    Types: Chew    Quit date: 10/07/1993  Vaping Use   Vaping status: Never Used  Substance and Sexual Activity   Alcohol use: No   Drug use: No   Sexual activity: Yes    Birth control/protection: None  Other Topics Concern   Not on file  Social History Narrative   Not on file   Social Drivers of Health   Financial Resource Strain: Not on file  Food Insecurity: Not on file  Transportation Needs: Not on file  Physical Activity: Not on file  Stress: Not on file  Social Connections: Not on file  Intimate Partner  Violence: Not on file    ALLERGIES:    Allergies  Allergen Reactions   Ventolin [Albuterol] Swelling    Immediate lip swelling    CURRENT MEDICATIONS:    Current Facility-Administered Medications  Medication Dose Route Frequency Provider Last Rate Last Admin   heparin ADULT infusion 100 units/mL (25000 units/261mL)  1,200 Units/hr Intravenous Continuous Rockwell Alexandria, RPH 12 mL/hr at 12/07/23 1718 1,200 Units/hr at 12/07/23 1718   sodium chloride 0.9 % bolus 1,000 mL  1,000 mL Intravenous Once Lelon Perla, PA-C       Current Outpatient Medications  Medication Sig Dispense Refill   ibuprofen (ADVIL) 200 MG tablet Take 800 mg by mouth 2 (two) times daily as needed for headache or moderate pain (pain score 4-6).     metFORMIN (GLUCOPHAGE) 1000 MG tablet TAKE 1 Tablet  BY MOUTH TWICE DAILY WITH A MEAL (Patient taking differently: Take 1,000 mg by mouth 2 (two) times daily as needed (hyperglycemia).) 180 tablet 1    REVIEW OF SYSTEMS:   [X]  denotes positive finding, [ ]  denotes negative finding Cardiac  Comments:  Chest pain or chest pressure: ***   Shortness  of breath upon exertion:    Short of breath when lying flat:    Irregular heart rhythm:        Vascular    Pain in calf, thigh, or hip brought on by ambulation:    Pain in feet at night that wakes you up from your sleep:     Blood clot in your veins:    Leg swelling:         Pulmonary    Oxygen at home:    Productive cough:     Wheezing:         Neurologic    Sudden weakness in arms or legs:     Sudden numbness in arms or legs:     Sudden onset of difficulty speaking or slurred speech:    Temporary loss of vision in one eye:     Problems with dizziness:         Gastrointestinal    Blood in stool:      Vomited blood:         Genitourinary    Burning when urinating:     Blood in urine:        Psychiatric    Major depression:         Hematologic    Bleeding problems:    Problems with blood  clotting too easily:        Skin    Rashes or ulcers:        Constitutional    Fever or chills:     PHYSICAL EXAM:   Vitals:   12/07/23 1513 12/07/23 1530 12/07/23 1630 12/07/23 1700  BP: (!) 161/97 (!) 151/97 (!) 140/107 (!) 163/96  Pulse: 96 96 (!) 107 92  Resp: 17 (!) 27 16 (!) 25  Temp: 99.4 F (37.4 C)     TempSrc: Oral     SpO2: 97% 96% 96% 95%  Weight:      Height:        GENERAL: The patient is a well-nourished male, in no acute distress. The vital signs are documented above. CARDIAC: There is a regular rate and rhythm.  VASCULAR: *** PULMONARY: Nonlabored respirations ABDOMEN: Soft and non-tender with normal pitched bowel sounds.  MUSCULOSKELETAL: There are no major deformities or cyanosis. NEUROLOGIC: No focal weakness or paresthesias are detected. SKIN: There are no ulcers or rashes noted. PSYCHIATRIC: The patient has a normal affect.  STUDIES:   I have reviewed the following: VASCULAR   1. 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 No evidence of high-grade stenosis. 2. 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 The more superior main renal arteries are widely patent bilaterally. 3. 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 4.  Aortic Atherosclerosis (ICD10-I70.0).   NON-VASCULAR   1. No acute intra-abdominal or intrapelvic process.  ASSESSMENT and PLAN   ***   Charlena Cross, MD, FACS [facility] [phone] Pager [phone]

## 2023-12-07 NOTE — H&P (Addendum)
 History and Physical    Mark Phillips ZOX:[id number] DOB: 02/25/1971 DOA: 12/07/2023  PCP: Pcp, No   Patient coming from: Home  I have personally briefly reviewed patient's old medical records in [facility]  Chief Complaint: Right big toe discoloration  HPI: Mark Phillips is a 53 y.o. male with medical history significant for diabetes mellitus, hypertension.  Patient presented to the ED with complaints of pain to his right foot particularly his big toe.  He reports about 3 weeks ago he started wearing a new pair of footwear, pain started around this time and worsened.  He attributed the worsening pain to the foot wear.  Then he noted discoloration to his first big toe.  Reports low-grade subjective fevers.   ED Course: Tmax 99.9.  Heart rate mostly 82-96.  Respiratory rate 15-33.  Blood pressure systolic 150s to 811.  O2 sats greater than 95% on room air. WBC 12.9.  Troponin 30 > 32. Lactic acid 1. 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  Focal high-grade stenosis within mid right SFA greater than 70% high-grade ostial stenosis at origin of inferior renal arteries bilaterally greater than 70%, widely patent superior main renal arteries bilaterally. EDP talked to vascular surgeon Dr. Myra Gianotti, recommended admission to [facility], aspirin, statin, can continue heparin for possibility of embolic process, and for angio 3/4.  Review of Systems: As per HPI all other systems reviewed and negative.  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   COLONOSCOPY WITH PROPOFOL N/A 09/19/2016   Procedure: COLONOSCOPY WITH PROPOFOL;  Surgeon: Corbin Ade, MD;  Location: [facility];  Service: Endoscopy;  Laterality: N/A;  930    LUMBAR DISC SURGERY     July 2017 - Dr. Channing Mutters   SHOULDER ARTHROSCOPY WITH SUBACROMIAL DECOMPRESSION AND OPEN ROTATOR C Right 02/25/2013   Procedure: RIGHT SHOULDER ARTHROSCOPY WITH SUBACROMIAL DECOMPRESSION AND MINI OPEN ROTATOR CUFF REPAIR;  Surgeon: Javier Docker, MD;  Location: [facility];  Service: Orthopedics;  Laterality: Right;   WISDOM TOOTH EXTRACTION  2011   WISDOM TOOTH EXTRACTION Bilateral      reports that he has been smoking cigarettes. He started smoking about 30 years ago. He has a 30.5 pack-year smoking history. He quit smokeless tobacco use about 30 years ago.  His smokeless tobacco use included chew. He reports that he does not drink alcohol and does not use drugs.  Allergies  Allergen Reactions   Ventolin [Albuterol] Swelling    Immediate lip swelling    Family History  Problem Relation Age of Onset   Heart attack Mother    Hypertension Mother    Diabetes Mellitus  II Mother    COPD Father    COPD Maternal Grandmother    Colon cancer Maternal Grandmother    Asthma Sister    Prior to Admission medications   Medication Sig Start Date End Date Taking? Authorizing Provider  ibuprofen (ADVIL) 200 MG tablet Take 800 mg by mouth 2 (two) times daily as needed for headache or moderate pain (pain score 4-6).   Yes [provider]  metFORMIN (GLUCOPHAGE) 1000 MG tablet TAKE 1 Tablet  BY MOUTH TWICE DAILY WITH A MEAL Patient taking differently: Take 1,000 mg by mouth 2 (two) times daily as needed (hyperglycemia).  01/06/19  Yes Jacquelin Hawking, PA-C    Physical Exam: Vitals:   12/07/23 1900 12/07/23 1930 12/07/23 2000 12/07/23 2052  BP: (!) 159/104 (!) 165/106 (!) 154/94 (!) 150/102  Pulse: 89 95 82 96  Resp: (!) 33 15 15 18   Temp: 98.9 F (37.2 C)   99.9 F (37.7 C)  TempSrc:    Oral  SpO2: 96% 97% 96% 96%  Weight:      Height:        Constitutional: NAD, calm, comfortable Vitals:   12/07/23 1900 12/07/23 1930 12/07/23 2000 12/07/23 2052  BP: (!) 159/104 (!) 165/106 (!) 154/94 (!) 150/102  Pulse: 89 95 82 96  Resp: (!) 33 15 15 18   Temp: 98.9 F (37.2 C)   99.9 F (37.7 C)  TempSrc:    Oral  SpO2: 96% 97% 96% 96%  Weight:      Height:       Eyes: PERRL, lids and conjunctivae normal ENMT: Mucous membranes are moist.   Neck: normal, supple, no masses, no thyromegaly Respiratory: clear to auscultation bilaterally, no wheezing, no crackles. Normal respiratory effort. No accessory muscle use.  Cardiovascular: Regular rate and rhythm, no murmurs / rubs / gallops. No extremity edema.  Abdomen: no tenderness, no masses palpated. No hepatosplenomegaly. Bowel sounds positive.  Musculoskeletal: no clubbing / cyanosis. No joint deformity upper and lower extremities.  Skin: Tenderness, discoloration to first and fifth toe, no open wounds, no rashes, lesions, ulcers. No induration Neurologic: No facial asymmetry, moving extremity spontaneously, speech fluent Psychiatric: Normal judgment and insight. Alert and oriented x 3. Normal mood.          Labs on Admission: I have personally reviewed following labs and imaging studies  CBC: Recent Labs  Lab 12/07/23 1425  WBC 12.9*  NEUTROABS 7.5  HGB 14.1  HCT 41.6  MCV 88.5  PLT 199   Basic Metabolic Panel: Recent Labs  Lab 12/07/23 1425  NA 137  K 3.6  CL 98  CO2 26  GLUCOSE 236*  BUN 8  CREATININE 0.75  CALCIUM 9.3   Coagulation Profile: Recent Labs  Lab 12/07/23 1521  INR 0.9   Radiological Exams on Admission: CT  Angio Aortobifemoral W and/or Wo Contrast Result Date: 12/07/2023 CLINICAL DATA:  Right foot pain with discoloration and swelling of great toe, diabetes EXAM: CT ANGIOGRAPHY OF ABDOMINAL AORTA WITH ILIOFEMORAL RUNOFF TECHNIQUE: Multidetector CT imaging of the abdomen, pelvis and lower extremities was performed using the standard protocol during bolus administration of intravenous contrast. Multiplanar CT image reconstructions and MIPs were obtained to evaluate the vascular anatomy. RADIATION DOSE REDUCTION: This exam was performed according to the departmental dose-optimization program which includes automated exposure control, adjustment of the mA and/or kV according to patient size and/or use of iterative reconstruction technique. CONTRAST:  OMNIPAQUE IOHEXOL 350 MG/ML SOLN COMPARISON:  None Available. FINDINGS:  VASCULAR Aorta: Normal caliber aorta without aneurysm, dissection, vasculitis or significant stenosis. Marked atherosclerosis of the infrarenal aorta. Celiac: Patent without evidence of aneurysm, dissection, vasculitis or significant stenosis. SMA: Patent without evidence of aneurysm, dissection, vasculitis or significant stenosis. 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 There is evidence of high-grade ostial stenosis of the inferior renal arteries bilaterally, estimated greater than 70% stenosis. The more superior bilateral renal arteries are widely patent. No evidence of aneurysm, dissection, or fibromuscular dysplasia. IMA: Patent without evidence of aneurysm, dissection, vasculitis or significant stenosis. 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 Diffuse atherosclerosis. No aneurysm, dissection, vasculitis, or high-grade stenosis. Outflow: Right common femoral artery is widely patent. There is focal high-grade stenosis of the mid right SFA, with greater than 70%  stenosis. Right popliteal artery is widely patent. No evidence of aneurysm, dissection, or vasculitis. Runoff: Patent three vessel runoff to the ankle. LEFT Lower Extremity Inflow: Common, internal and external iliac arteries are patent without evidence of aneurysm, dissection, vasculitis or significant stenosis. Mild diffuse atherosclerosis. Outflow: Common, superficial and profunda femoral arteries and the popliteal artery are patent without evidence of aneurysm, dissection, vasculitis or significant stenosis. Runoff: Patent three vessel runoff to the ankle. Veins: No obvious venous abnormality within the limitations of this arterial phase study. Review of the MIP images confirms the above findings. NON-VASCULAR Lower chest: No acute pleural or parenchymal lung disease. Hepatobiliary: No focal liver abnormality is seen. No gallstones, gallbladder wall thickening, or biliary dilatation. Pancreas: Unremarkable. No pancreatic ductal dilatation or surrounding inflammatory changes. Spleen: Normal in size without focal abnormality. Adrenals/Urinary Tract: Adrenal glands are unremarkable. Kidneys are normal, without renal calculi, focal lesion, or hydronephrosis. Bladder is unremarkable. Stomach/Bowel: No bowel obstruction or ileus. The appendix is surgically absent. No bowel wall thickening or inflammatory change. Lymphatic: No pathologic adenopathy within the abdomen or pelvis. Reproductive: Prostate is unremarkable. Other: No free fluid or free intraperitoneal gas. No abdominal wall hernia. Musculoskeletal: No acute or destructive bony abnormalities. Reconstructed images demonstrate no additional findings. IMPRESSION: VASCULAR 1. 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 No evidence of high-grade stenosis. 2. Duplicated bilateral renal arteries, with high-grade ostial stenosis at the origin of the inferior renal arteries bilaterally, estimated greater than  70%. The more superior main renal arteries are widely patent bilaterally. 3. 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 4.  Aortic Atherosclerosis (ICD10-I70.0). NON-VASCULAR 1. No acute intra-abdominal or intrapelvic process. Electronically Signed   By: Sharlet Salina M.D.   On: 12/07/2023 16:34   DG Foot Complete Right Result Date: 12/07/2023 CLINICAL DATA:  Pain and swelling in the great toe with discoloration. No known injury. History of diabetes. EXAM: RIGHT FOOT COMPLETE - 3+ VIEW COMPARISON:  None Available. FINDINGS: The mineralization and alignment are normal. There is no evidence of acute fracture or dislocation. The joint spaces are preserved. No erosive changes are identified. There is some soft tissue swelling medially in the forefoot without evidence of soft tissue calcification, foreign body or soft tissue emphysema. IMPRESSION: Nonspecific mild medial forefoot soft tissue swelling. No acute osseous findings or significant arthropathic changes. Electronically Signed   By: Carey Bullocks M.D.   On: 12/07/2023 14:49    EKG: Independently reviewed.  Sinus rhythm, rate 96, QTc 477.  No significant ST or T wave abnormalities.  Assessment/Plan Principal Problem:  Great toe pain, right Active Problems:   OSA (obstructive sleep apnea)   Tobacco user   HTN (hypertension)   DM (diabetes mellitus) (HCC)    Assessment and Plan: * Great toe pain, right With pain and discoloration. Leukocytosis of 12.9.  Tmax of 99.9.  With heart rate 82-96.  Normal lactic acid of 1. 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  Focal high-grade stenosis within mid right SFA greater than 70% - EDP talked to vascular surgeon Dr. Myra Gianotti, recommended admission to [facility], okay to continue heparin for possible embolic process, aspirin, statin and for angio 3/4, ABIs ordered, n.p.o. midnight  tomorrow. -IV ceftriaxone 2 g daily -Obtain blood cultures -Lipid panel -Atorvastatin 40 mg daily - 1 L bolus, cont N/s 100cc/hr x 15 hrs  DM (diabetes mellitus) (HCC) Currently just on metformin.  Previously on oral medications, including injections but not insulin but due to insurance issues he has not been able to afford them. - Hgba1c - SSI- M -TOC consult to help with meds on discharge  HTN (hypertension) Systolic 150s to 130Q.  Not on medication -Monitor for now, may need to start antihypertensives.    DVT prophylaxis: Heparin drip Code Status: Full code Family Communication: Son Durene Cal at bedside. Disposition Plan:  ~/> 2 days Consults called: Vasc Surgery Admission status: inpt tele I certify that at the point of admission it is my clinical judgment that the patient will require inpatient hospital care spanning beyond 2 midnights from the point of admission due to high intensity of service, high risk for further deterioration and high frequency of surveillance required.    Author: Onnie Boer, MD 12/07/2023 10:17 PM  For on call review [email or web address].

## 2023-12-07 NOTE — ED Provider Notes (Signed)
 [place] EMERGENCY DEPARTMENT AT [facility] Provider Note   CSN: [id number] Arrival date & time: 12/07/23  1345     History  Chief Complaint  Patient presents with   Foot Pain    Mark Phillips is a 53 y.o. male.  Patient is a 53 year old male who presents emergency department the chief complaint of color changes to his right great toe.  Patient notes that symptoms have been ongoing for the past few days.  He notes that he was wearing a new para shoes and unsure if they may have rubbed him the wrong way.  He does note that he is a diabetic.  Patient does note that he has had associated pain in the toe with walking.  He denies any recent falls or blunt trauma to the area.   Foot Pain       Home Medications Prior to Admission medications   Medication Sig Start Date End Date Taking? Authorizing Provider  ibuprofen (ADVIL) 200 MG tablet Take 800 mg by mouth 2 (two) times daily as needed for headache or moderate pain (pain score 4-6).   Yes [provider]  metFORMIN (GLUCOPHAGE) 1000 MG tablet TAKE 1 Tablet  BY MOUTH TWICE DAILY WITH A MEAL Patient taking differently: Take 1,000 mg by mouth 2 (two) times daily as needed (hyperglycemia). 01/06/19  Yes Jacquelin Hawking, PA-C      Allergies    Ventolin [albuterol]    Review of Systems   Review of Systems  Musculoskeletal:        Pain and color changes to right great toe  All other systems reviewed and are negative.   Physical Exam Updated Vital Signs BP (!) 164/95   Pulse 90   Temp 99.4 F (37.4 C) (Oral)   Resp (!) 29   Ht 5\' 11"  (1.803 m)   Wt 74.8 kg   SpO2 95%   BMI 23.01 kg/m  Physical Exam Vitals and nursing note reviewed.  Constitutional:      Appearance: Normal appearance.  HENT:     Head: Normocephalic and atraumatic.  Eyes:     Extraocular Movements: Extraocular movements intact.     Conjunctiva/sclera: Conjunctivae normal.     Pupils: Pupils are equal, round, and reactive to  light.  Cardiovascular:     Rate and Rhythm: Normal rate and regular rhythm.     Pulses: Normal pulses.     Heart sounds: Normal heart sounds. No murmur heard.    No gallop.  Pulmonary:     Effort: Pulmonary effort is normal. No respiratory distress.     Breath sounds: Normal breath sounds. No stridor. No wheezing or rales.  Abdominal:     General: Abdomen is flat. Bowel sounds are normal. There is no distension.     Palpations: Abdomen is soft. There is no mass.     Tenderness: There is no abdominal tenderness.  Musculoskeletal:        General: Normal range of motion.     Cervical back: Normal range of motion and neck supple.     Comments: Tenderness palpation of the right great toe, cyanosis noted over the right great toe, poor cap refill, no overlying erythema or warmth, DP pulses 1+ in right foot, nontender palpation of remainder of right lower extremity, no obvious deformity or bruising, no skin breakdown or ulceration, no lacerations or abrasions  Skin:    General: Skin is warm and dry.  Neurological:     General: No focal  deficit present.     Mental Status: He is alert and oriented to person, place, and time. Mental status is at baseline.  Psychiatric:        Mood and Affect: Mood normal.        Behavior: Behavior normal.        Thought Content: Thought content normal.        Judgment: Judgment normal.     ED Results / Procedures / Treatments   Labs (all labs ordered are listed, but only abnormal results are displayed) Labs Reviewed  CBC WITH DIFFERENTIAL/PLATELET - Abnormal; Notable for the following components:      Result Value   WBC 12.9 (*)    Eosinophils Absolute 0.7 (*)    All other components within normal limits  BASIC METABOLIC PANEL - Abnormal; Notable for the following components:   Glucose, Bld 236 (*)    All other components within normal limits  TROPONIN I (HIGH SENSITIVITY) - Abnormal; Notable for the following components:   Troponin I (High  Sensitivity) 30 (*)    All other components within normal limits  TROPONIN I (HIGH SENSITIVITY) - Abnormal; Notable for the following components:   Troponin I (High Sensitivity) 32 (*)    All other components within normal limits  PROTIME-INR  APTT  LACTIC ACID, PLASMA  LACTIC ACID, PLASMA  HEPARIN LEVEL (UNFRACTIONATED)    EKG EKG Interpretation Date/Time:  Sunday December 07 2023 15:10:31 EST Ventricular Rate:  96 PR Interval:  171 QRS Duration:  102 QT Interval:  377 QTC Calculation: 477 R Axis:   -32  Text Interpretation: Sinus rhythm Inferior infarct, old Anterior infarct, old Confirmed by Gloris Manchester [phone]) on 12/07/2023 6:05:04 PM  Radiology CT Angio Aortobifemoral W and/or Wo Contrast Result Date: 12/07/2023 CLINICAL DATA:  Right foot pain with discoloration and swelling of great toe, diabetes EXAM: CT ANGIOGRAPHY OF ABDOMINAL AORTA WITH ILIOFEMORAL RUNOFF TECHNIQUE: Multidetector CT imaging of the abdomen, pelvis and lower extremities was performed using the standard protocol during bolus administration of intravenous contrast. Multiplanar CT image reconstructions and MIPs were obtained to evaluate the vascular anatomy. RADIATION DOSE REDUCTION: This exam was performed according to the departmental dose-optimization program which includes automated exposure control, adjustment of the mA and/or kV according to patient size and/or use of iterative reconstruction technique. CONTRAST:  OMNIPAQUE IOHEXOL 350 MG/ML SOLN COMPARISON:  None Available. FINDINGS: VASCULAR Aorta: Normal caliber aorta without aneurysm, dissection, vasculitis or significant stenosis. Marked atherosclerosis of the infrarenal aorta. Celiac: Patent without evidence of aneurysm, dissection, vasculitis or significant stenosis. SMA: Patent without evidence of aneurysm, dissection, vasculitis or significant stenosis. 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 There is evidence of high-grade ostial stenosis  of the inferior renal arteries bilaterally, estimated greater than 70% stenosis. The more superior bilateral renal arteries are widely patent. No evidence of aneurysm, dissection, or fibromuscular dysplasia. IMA: Patent without evidence of aneurysm, dissection, vasculitis or significant stenosis. 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 Diffuse atherosclerosis. No aneurysm, dissection, vasculitis, or high-grade stenosis. Outflow: Right common femoral artery is widely patent. There is focal high-grade stenosis of the mid right SFA, with greater than 70% stenosis. Right popliteal artery is widely patent. No evidence of aneurysm, dissection, or vasculitis. Runoff: Patent three vessel runoff to the ankle. LEFT Lower Extremity Inflow: Common, internal and external iliac arteries are patent without evidence of aneurysm, dissection, vasculitis or  significant stenosis. Mild diffuse atherosclerosis. Outflow: Common, superficial and profunda femoral arteries and the popliteal artery are patent without evidence of aneurysm, dissection, vasculitis or significant stenosis. Runoff: Patent three vessel runoff to the ankle. Veins: No obvious venous abnormality within the limitations of this arterial phase study. Review of the MIP images confirms the above findings. NON-VASCULAR Lower chest: No acute pleural or parenchymal lung disease. Hepatobiliary: No focal liver abnormality is seen. No gallstones, gallbladder wall thickening, or biliary dilatation. Pancreas: Unremarkable. No pancreatic ductal dilatation or surrounding inflammatory changes. Spleen: Normal in size without focal abnormality. Adrenals/Urinary Tract: Adrenal glands are unremarkable. Kidneys are normal, without renal calculi, focal lesion, or hydronephrosis. Bladder is unremarkable. Stomach/Bowel: No bowel obstruction or ileus. The appendix is  surgically absent. No bowel wall thickening or inflammatory change. Lymphatic: No pathologic adenopathy within the abdomen or pelvis. Reproductive: Prostate is unremarkable. Other: No free fluid or free intraperitoneal gas. No abdominal wall hernia. Musculoskeletal: No acute or destructive bony abnormalities. Reconstructed images demonstrate no additional findings. IMPRESSION: VASCULAR 1. 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 No evidence of high-grade stenosis. 2. 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 The more superior main renal arteries are widely patent bilaterally. 3. 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 4.  Aortic Atherosclerosis (ICD10-I70.0). NON-VASCULAR 1. No acute intra-abdominal or intrapelvic process. Electronically Signed   By: Sharlet Salina M.D.   On: 12/07/2023 16:34   DG Foot Complete Right Result Date: 12/07/2023 CLINICAL DATA:  Pain and swelling in the great toe with discoloration. No known injury. History of diabetes. EXAM: RIGHT FOOT COMPLETE - 3+ VIEW COMPARISON:  None Available. FINDINGS: The mineralization and alignment are normal. There is no evidence of acute fracture or dislocation. The joint spaces are preserved. No erosive changes are identified. There is some soft tissue swelling medially in the forefoot without evidence of soft tissue calcification, foreign body or soft tissue emphysema. IMPRESSION: Nonspecific mild medial forefoot soft tissue swelling. No acute osseous findings or significant arthropathic changes. Electronically Signed   By: Carey Bullocks M.D.   On: 12/07/2023 14:49    Procedures .Critical Care  Performed by: Lelon Perla, PA-C Authorized by: Lelon Perla, PA-C   Critical care provider statement:    Critical care time  (minutes):  35   Critical care was necessary to treat or prevent imminent or life-threatening deterioration of the following conditions: Heparin drip.   Critical care was time spent personally by me on the following activities:  Development of treatment plan with patient or surrogate, discussions with consultants, evaluation of patient's response to treatment, examination of patient, ordering and review of laboratory studies, ordering and review of radiographic studies, ordering and performing treatments and interventions, pulse oximetry, re-evaluation of patient's condition and review of old charts   I assumed direction of critical care for this patient from another provider in my specialty: no     Care discussed with: admitting provider       Medications Ordered in ED Medications  heparin ADULT infusion 100 units/mL (25000 units/268mL) (1,200 Units/hr Intravenous New Bag/Given 12/07/23 1718)  iohexol (OMNIPAQUE) 350 MG/ML injection 125 mL (125 mLs Intravenous Contrast Given 12/07/23 1552)  morphine (PF) 4 MG/ML injection 4 mg (4 mg Intravenous Given 12/07/23 1712)  ondansetron (ZOFRAN) injection 4 mg (4 mg Intravenous Given 12/07/23 1711)  heparin  bolus via infusion 4,400 Units (4,400 Units Intravenous Bolus from Bag 12/07/23 1719)  sodium chloride 0.9 % bolus 1,000 mL (1,000 mLs Intravenous New Bag/Given 12/07/23 1833)    ED Course/ Medical Decision Making/ A&P                                 Medical Decision Making Amount and/or Complexity of Data Reviewed Labs: ordered. Radiology: ordered.  Risk OTC drugs. Prescription drug management. Decision regarding hospitalization.   This patient presents to the ED for concern of pain and color changes to right great toe, this involves an extensive number of treatment options, and is a complaint that carries with it a high risk of complications and morbidity.  The differential diagnosis includes acute arterial occlusion, cellulitis, abscess, gangrene,  fracture, DVT   Co morbidities that complicate the patient evaluation  Diabetes   Additional history obtained:  Additional history obtained from none External records from outside source obtained and reviewed including none   Lab Tests:  I Ordered, and personally interpreted labs.  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  I personally viewed and interpreted the cardiac monitored which showed an underlying rhythm of: Normal sinus rhythm, rate of 96, normal PR/QRS normal, normal QTc, no ST/T wave changes, no ischemic changes, no STEMI   Consultations Obtained:  I requested consultation with the vascular surgery, Dr. Myra Gianotti,  and discussed lab and imaging findings as well as pertinent plan - they recommend: Admission to [facility], heparin drip, statin, aspirin, no Plavix   Problem List / ED Course / Critical interventions / Medication management  Patient does remain stable at this time.  Patient case was discussed with vascular surgery who does note that the patient does need to be admitted to [facility] for angio.  Patient is on heparin drip at this time and he is to be started on aspirin and and statin.  Vascular did state that the patient did not need to be placed on Plavix at this point.  Patient does have a mild elevation in his troponin with no acute ischemic changes on EKG.  Patient has no active chest pain or shortness of breath at this time.  He has no clinical indication for cellulitis over the affected toe.  Have discussed patient case with Dr. Wendall Stade accepted for admission at this time. I ordered medication  including heparin drip for cyanotic great toe Reevaluation of the patient after these medicines showed that the patient improved I have reviewed the patients home medicines and have made adjustments as needed   Social Determinants of Health:  None   Test / Admission - Considered:  Admission        Final Clinical Impression(s) / ED Diagnoses Final diagnoses:  None    Rx / DC Orders ED Discharge Orders     None         Kathlen Mody 12/07/23 1917    Gloris Manchester, MD 12/08/23 [phone]

## 2023-12-07 NOTE — H&P (View-Only) (Signed)
 [facility]  Patient name: Mark Phillips MRN: [id number] DOB: Jan 16, 1971 Sex: male   REQUESTING PROVIDER:   ER   REASON FOR CONSULT:    Right great toe pain  HISTORY OF PRESENT ILLNESS:   Mark Phillips is a 53 y.o. male, who presented to the [facility] emergency department complaining of color changes to his right great toe.  His symptoms have been going on for several days.  He feels that this may be secondary to wearing a new pair of shoes that may have rubbed it in the wrong way.  He denies any obvious trauma.  This is causing him some pain.  He denies any symptoms of claudication.  He does not have rest pain.  There has been no drainage.  He has not had any fevers.  The patient is a diabetic.  He does not check his blood sugars routinely but says they are usually around 200.  He is a current smoker.  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   PONV (postoperative nausea and vomiting)    Sleep apnea    Type 2 diabetes mellitus (HCC)    diagnosed age 23     FAMILY HISTORY   Family History  Problem Relation Age of Onset   Heart attack Mother    Hypertension Mother    Diabetes Mellitus II Mother    COPD Father    COPD Maternal Grandmother    Colon cancer Maternal Grandmother    Asthma Sister     SOCIAL HISTORY:   Social History   Socioeconomic History   Marital status: Married    Spouse name: Not on file   Number of children: Not on file   Years of education: Not on file   Highest education level: Not on file  Occupational History   Not on file  Tobacco Use   Smoking status: Every Day    Current packs/day: 1.00    Average packs/day: 1 pack/day for 30.5 years (30.5 ttl pk-yrs)    Types: Cigarettes     Start date: 06/13/1993   Smokeless tobacco: Former    Types: Chew    Quit date: 10/07/1993  Vaping Use   Vaping status: Never Used  Substance and Sexual Activity   Alcohol use: No   Drug use: No   Sexual activity: Yes    Birth control/protection: None  Other Topics Concern   Not on file  Social History Narrative   Not on file   Social Drivers of Health   Financial Resource Strain: Not on file  Food Insecurity: Not on file  Transportation Needs: Not on file  Physical Activity: Not on file  Stress: Not on file  Social Connections: Not on file  Intimate Partner Violence: Not on file    ALLERGIES:    Allergies  Allergen Reactions   Ventolin [Albuterol] Swelling    Immediate lip swelling    CURRENT MEDICATIONS:    Current Facility-Administered Medications  Medication Dose Route Frequency Provider Last Rate Last Admin   acetaminophen (TYLENOL) tablet 650 mg  650 mg Oral Q6H PRN Emokpae, Ejiroghene E, MD       Or   acetaminophen (TYLENOL) suppository 650 mg  650 mg Rectal Q6H PRN  Emokpae, Ejiroghene E, MD       heparin ADULT infusion 100 units/mL (25000 units/215mL)  1,200 Units/hr Intravenous Continuous Emokpae, Ejiroghene E, MD 12 mL/hr at 12/07/23 1718 1,200 Units/hr at 12/07/23 1718   insulin aspart (novoLOG) injection 0-15 Units  0-15 Units Subcutaneous Q6H Emokpae, Ejiroghene E, MD       ondansetron (ZOFRAN) tablet 4 mg  4 mg Oral Q6H PRN Emokpae, Ejiroghene E, MD       Or   ondansetron (ZOFRAN) injection 4 mg  4 mg Intravenous Q6H PRN Emokpae, Ejiroghene E, MD       polyethylene glycol (MIRALAX / GLYCOLAX) packet 17 g  17 g Oral Daily PRN Emokpae, Ejiroghene E, MD        REVIEW OF SYSTEMS:   [X]  denotes positive finding, [ ]  denotes negative finding Cardiac  Comments:  Chest pain or chest pressure:    Shortness of breath upon exertion:    Short of breath when lying flat:    Irregular heart rhythm:        Vascular    Pain in calf, thigh, or hip brought on by  ambulation:    Pain in feet at night that wakes you up from your sleep:     Blood clot in your veins:    Leg swelling:         Pulmonary    Oxygen at home:    Productive cough:     Wheezing:         Neurologic    Sudden weakness in arms or legs:     Sudden numbness in arms or legs:     Sudden onset of difficulty speaking or slurred speech:    Temporary loss of vision in one eye:     Problems with dizziness:         Gastrointestinal    Blood in stool:      Vomited blood:         Genitourinary    Burning when urinating:     Blood in urine:        Psychiatric    Major depression:         Hematologic    Bleeding problems:    Problems with blood clotting too easily:        Skin    Rashes or ulcers:        Constitutional    Fever or chills:     PHYSICAL EXAM:   Vitals:   12/07/23 1900 12/07/23 1930 12/07/23 2000 12/07/23 2052  BP: (!) 159/104 (!) 165/106 (!) 154/94 (!) 150/102  Pulse: 89 95 82 96  Resp: (!) 33 15 15 18   Temp: 98.9 F (37.2 C)   99.9 F (37.7 C)  TempSrc:    Oral  SpO2: 96% 97% 96% 96%  Weight:      Height:        GENERAL: The patient is a well-nourished male, in no acute distress. The vital signs are documented above. CARDIAC: There is a regular rate and rhythm.  VASCULAR: Palpable femoral pulses bilaterally.  The left dorsalis pedis is palpable.  I do not palpate pedal pulses on the right PULMONARY: Nonlabored respirations ABDOMEN: Soft and non-tender with normal pitched bowel sounds.  MUSCULOSKELETAL: There are no major deformities or cyanosis. NEUROLOGIC: No focal weakness or paresthesias are detected. SKIN: The right great toe is pale in appearance PSYCHIATRIC: The patient has a normal affect.  STUDIES:   I have reviewed his CT scan  with the following findings: 1. 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 No evidence of high-grade stenosis. 2. Duplicated bilateral renal  arteries, with high-grade ostial stenosis at the origin of the inferior renal arteries bilaterally, estimated greater than 70%. The more superior main renal arteries are widely patent bilaterally. 3. 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 4.  Aortic Atherosclerosis (ICD10-I70.0).   NON-VASCULAR   1. No acute intra-abdominal or intrapelvic process.  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  In addition he has a right SFA stenosis.  Because of the appearance of his toe, I am concerned with his current vascular status that he may have issues with wound healing.  Because of his diabetes and smoking this could also be significant foot disease.  Regardless I think angiography is necessary to define his anatomy and to determine whether or not he needs to have intervention in his right common iliac artery and the right SFA.  I discussed the possibility of stenting or angioplasty.  This will be scheduled for Tuesday.  He will need to be n.p.o. after midnight on Monday night  The patient was started on heparin for the possibility of an embolic process.  I think it is reasonable to continue this.  I would also gently hydrate him for his angiogram on Tuesday.  I am going to check ABIs for baseline.  We discussed the importance of smoking cessation.  He will need to be started on aspirin and a statin.  Additional antiplatelet medication will be based on whether or not he requires intervention.   Charlena Cross, MD, FACS [facility] [phone] Pager [phone]

## 2023-12-07 NOTE — ED Triage Notes (Signed)
 Pt with right foot pain, right great toe discoloration and swelling.  Denies any injury.  Noted after wearing new shoes. + diabetes.

## 2023-12-07 NOTE — Assessment & Plan Note (Addendum)
 Currently just on metformin.  Previously on oral medications, including injections but not insulin but due to insurance issues he has not been able to afford them. - Hgba1c - SSI- M -TOC consult to help with meds on discharge

## 2023-12-07 NOTE — ED Notes (Signed)
 [facility] called for transport.

## 2023-12-07 NOTE — Consult Note (Signed)
 Vascular and Vein Specialist of [facility]  Patient name: Mark Phillips MRN: [id number] DOB: Jan 16, 1971 Sex: male   REQUESTING PROVIDER:   ER   REASON FOR CONSULT:    Right great toe pain  HISTORY OF PRESENT ILLNESS:   Mark Phillips is a 53 y.o. male, who presented to the [facility] emergency department complaining of color changes to his right great toe.  His symptoms have been going on for several days.  He feels that this may be secondary to wearing a new pair of shoes that may have rubbed it in the wrong way.  He denies any obvious trauma.  This is causing him some pain.  He denies any symptoms of claudication.  He does not have rest pain.  There has been no drainage.  He has not had any fevers.  The patient is a diabetic.  He does not check his blood sugars routinely but says they are usually around 200.  He is a current smoker.  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   PONV (postoperative nausea and vomiting)    Sleep apnea    Type 2 diabetes mellitus (HCC)    diagnosed age 23     FAMILY HISTORY   Family History  Problem Relation Age of Onset   Heart attack Mother    Hypertension Mother    Diabetes Mellitus II Mother    COPD Father    COPD Maternal Grandmother    Colon cancer Maternal Grandmother    Asthma Sister     SOCIAL HISTORY:   Social History   Socioeconomic History   Marital status: Married    Spouse name: Not on file   Number of children: Not on file   Years of education: Not on file   Highest education level: Not on file  Occupational History   Not on file  Tobacco Use   Smoking status: Every Day    Current packs/day: 1.00    Average packs/day: 1 pack/day for 30.5 years (30.5 ttl pk-yrs)    Types: Cigarettes     Start date: 06/13/1993   Smokeless tobacco: Former    Types: Chew    Quit date: 10/07/1993  Vaping Use   Vaping status: Never Used  Substance and Sexual Activity   Alcohol use: No   Drug use: No   Sexual activity: Yes    Birth control/protection: None  Other Topics Concern   Not on file  Social History Narrative   Not on file   Social Drivers of Health   Financial Resource Strain: Not on file  Food Insecurity: Not on file  Transportation Needs: Not on file  Physical Activity: Not on file  Stress: Not on file  Social Connections: Not on file  Intimate Partner Violence: Not on file    ALLERGIES:    Allergies  Allergen Reactions   Ventolin [Albuterol] Swelling    Immediate lip swelling    CURRENT MEDICATIONS:    Current Facility-Administered Medications  Medication Dose Route Frequency Provider Last Rate Last Admin   acetaminophen (TYLENOL) tablet 650 mg  650 mg Oral Q6H PRN Emokpae, Ejiroghene E, MD       Or   acetaminophen (TYLENOL) suppository 650 mg  650 mg Rectal Q6H PRN  Emokpae, Ejiroghene E, MD       heparin ADULT infusion 100 units/mL (25000 units/215mL)  1,200 Units/hr Intravenous Continuous Emokpae, Ejiroghene E, MD 12 mL/hr at 12/07/23 1718 1,200 Units/hr at 12/07/23 1718   insulin aspart (novoLOG) injection 0-15 Units  0-15 Units Subcutaneous Q6H Emokpae, Ejiroghene E, MD       ondansetron (ZOFRAN) tablet 4 mg  4 mg Oral Q6H PRN Emokpae, Ejiroghene E, MD       Or   ondansetron (ZOFRAN) injection 4 mg  4 mg Intravenous Q6H PRN Emokpae, Ejiroghene E, MD       polyethylene glycol (MIRALAX / GLYCOLAX) packet 17 g  17 g Oral Daily PRN Emokpae, Ejiroghene E, MD        REVIEW OF SYSTEMS:   [X]  denotes positive finding, [ ]  denotes negative finding Cardiac  Comments:  Chest pain or chest pressure:    Shortness of breath upon exertion:    Short of breath when lying flat:    Irregular heart rhythm:        Vascular    Pain in calf, thigh, or hip brought on by  ambulation:    Pain in feet at night that wakes you up from your sleep:     Blood clot in your veins:    Leg swelling:         Pulmonary    Oxygen at home:    Productive cough:     Wheezing:         Neurologic    Sudden weakness in arms or legs:     Sudden numbness in arms or legs:     Sudden onset of difficulty speaking or slurred speech:    Temporary loss of vision in one eye:     Problems with dizziness:         Gastrointestinal    Blood in stool:      Vomited blood:         Genitourinary    Burning when urinating:     Blood in urine:        Psychiatric    Major depression:         Hematologic    Bleeding problems:    Problems with blood clotting too easily:        Skin    Rashes or ulcers:        Constitutional    Fever or chills:     PHYSICAL EXAM:   Vitals:   12/07/23 1900 12/07/23 1930 12/07/23 2000 12/07/23 2052  BP: (!) 159/104 (!) 165/106 (!) 154/94 (!) 150/102  Pulse: 89 95 82 96  Resp: (!) 33 15 15 18   Temp: 98.9 F (37.2 C)   99.9 F (37.7 C)  TempSrc:    Oral  SpO2: 96% 97% 96% 96%  Weight:      Height:        GENERAL: The patient is a well-nourished male, in no acute distress. The vital signs are documented above. CARDIAC: There is a regular rate and rhythm.  VASCULAR: Palpable femoral pulses bilaterally.  The left dorsalis pedis is palpable.  I do not palpate pedal pulses on the right PULMONARY: Nonlabored respirations ABDOMEN: Soft and non-tender with normal pitched bowel sounds.  MUSCULOSKELETAL: There are no major deformities or cyanosis. NEUROLOGIC: No focal weakness or paresthesias are detected. SKIN: The right great toe is pale in appearance PSYCHIATRIC: The patient has a normal affect.  STUDIES:   I have reviewed his CT scan  with the following findings: 1. 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 No evidence of high-grade stenosis. 2. Duplicated bilateral renal  arteries, with high-grade ostial stenosis at the origin of the inferior renal arteries bilaterally, estimated greater than 70%. The more superior main renal arteries are widely patent bilaterally. 3. 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 4.  Aortic Atherosclerosis (ICD10-I70.0).   NON-VASCULAR   1. No acute intra-abdominal or intrapelvic process.  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  In addition he has a right SFA stenosis.  Because of the appearance of his toe, I am concerned with his current vascular status that he may have issues with wound healing.  Because of his diabetes and smoking this could also be significant foot disease.  Regardless I think angiography is necessary to define his anatomy and to determine whether or not he needs to have intervention in his right common iliac artery and the right SFA.  I discussed the possibility of stenting or angioplasty.  This will be scheduled for Tuesday.  He will need to be n.p.o. after midnight on Monday night  The patient was started on heparin for the possibility of an embolic process.  I think it is reasonable to continue this.  I would also gently hydrate him for his angiogram on Tuesday.  I am going to check ABIs for baseline.  We discussed the importance of smoking cessation.  He will need to be started on aspirin and a statin.  Additional antiplatelet medication will be based on whether or not he requires intervention.   Charlena Cross, MD, FACS [facility] and Vein Specialists of [facility] [phone] Pager [phone]

## 2023-12-07 NOTE — Assessment & Plan Note (Signed)
 Systolic 150s to 161W.  Not on medication -Monitor for now, may need to start antihypertensives.

## 2023-12-07 NOTE — ED Notes (Signed)
 Patient transported to X-ray

## 2023-12-07 NOTE — Progress Notes (Signed)
 PHARMACY - ANTICOAGULATION CONSULT NOTE  Pharmacy Consult for heparin Indication:  ischemic toe  Allergies  Allergen Reactions   Albuterol Swelling    Lip Swelling   Patient Measurements: Height: 5\' 11"  (180.3 cm) Weight: 74.8 kg (165 lb) IBW/kg (Calculated) : 75.3 Heparin Dosing Weight: 74.8 kg  Vital Signs: Temp: 99.4 F (37.4 C) (03/02 1513) Temp Source: Oral (03/02 1513) BP: 161/97 (03/02 1513) Pulse Rate: 96 (03/02 1513)  Labs: Recent Labs    12/07/23 1425 12/07/23 1521  HGB 14.1  --   HCT 41.6  --   PLT 199  --   APTT  --  24  LABPROT  --  12.6  INR  --  0.9  CREATININE 0.75  --   TROPONINIHS  --  30*    Estimated Creatinine Clearance: 114.3 mL/min (by C-G formula based on SCr of 0.75 mg/dL).   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 PMH significant for HTN and GERD. Pharmacy has been consulted to initiate heparin infusion. Per chart review, patient not on anticoagulation prior to admission.  Baseline labs: Hgb 14.1, plt 199, INR 0.9  Goal of Therapy:  Heparin level 0.3-0.7 units/ml Monitor platelets by anticoagulation protocol: Yes   Plan:  Give 4400 units bolus x 1 Start heparin infusion at 1200 units/hr Check anti-Xa level in 6 hours and daily while on heparin Continue to monitor H&H and platelets  Thank you for involving pharmacy in this patient's care.   Rockwell Alexandria, PharmD Clinical Pharmacist 12/07/2023 5:10 PM

## 2023-12-08 ENCOUNTER — Inpatient Hospital Stay (HOSPITAL_COMMUNITY): Payer: Self-pay

## 2023-12-08 DIAGNOSIS — I7090 Unspecified atherosclerosis: Secondary | ICD-10-CM

## 2023-12-08 LAB — LIPID PANEL
Cholesterol: 174 mg/dL (ref 0–200)
HDL: 22 mg/dL — ABNORMAL LOW (ref >40)
LDL Cholesterol: 120 mg/dL — ABNORMAL HIGH (ref 0–99)
Total CHOL/HDL Ratio: 7.9 ratio
Triglycerides: 159 mg/dL — ABNORMAL HIGH (ref <150)
VLDL: 32 mg/dL (ref 0–40)

## 2023-12-08 LAB — CBC
HCT: 35.6 % — ABNORMAL LOW (ref 39.0–52.0)
Hemoglobin: 12.2 g/dL — ABNORMAL LOW (ref 13.0–17.0)
MCH: 30.1 pg (ref 26.0–34.0)
MCHC: 34.3 g/dL (ref 30.0–36.0)
MCV: 87.9 fL (ref 80.0–100.0)
Platelets: 165 10*3/uL (ref 150–400)
RBC: 4.05 MIL/uL — ABNORMAL LOW (ref 4.22–5.81)
RDW: 13.3 % (ref 11.5–15.5)
WBC: 12.6 10*3/uL — ABNORMAL HIGH (ref 4.0–10.5)
nRBC: 0 % (ref 0.0–0.2)

## 2023-12-08 LAB — BASIC METABOLIC PANEL
Anion gap: 10 (ref 5–15)
BUN: 5 mg/dL — ABNORMAL LOW (ref 6–20)
CO2: 24 mmol/L (ref 22–32)
Calcium: 8 mg/dL — ABNORMAL LOW (ref 8.9–10.3)
Chloride: 102 mmol/L (ref 98–111)
Creatinine, Ser: 0.66 mg/dL (ref 0.61–1.24)
GFR, Estimated: >60 mL/min (ref >60)
Glucose, Bld: 106 mg/dL — ABNORMAL HIGH (ref 70–99)
Potassium: 3.3 mmol/L — ABNORMAL LOW (ref 3.5–5.1)
Sodium: 136 mmol/L (ref 135–145)

## 2023-12-08 LAB — VAS US ABI WITH/WO TBI
Left ABI: 1.06
Right ABI: 0.82

## 2023-12-08 LAB — HIV ANTIBODY (ROUTINE TESTING W REFLEX): HIV Screen 4th Generation wRfx: NONREACTIVE

## 2023-12-08 LAB — GLUCOSE, CAPILLARY
Glucose-Capillary: 120 mg/dL — ABNORMAL HIGH (ref 70–99)
Glucose-Capillary: 120 mg/dL — ABNORMAL HIGH (ref 70–99)
Glucose-Capillary: 115 mg/dL — ABNORMAL HIGH (ref 70–99)
Glucose-Capillary: 137 mg/dL — ABNORMAL HIGH (ref 70–99)
Glucose-Capillary: 148 mg/dL — ABNORMAL HIGH (ref 70–99)
Glucose-Capillary: 113 mg/dL — ABNORMAL HIGH (ref 70–99)

## 2023-12-08 LAB — HEPARIN LEVEL (UNFRACTIONATED)
Heparin Unfractionated: 0.1 [IU]/mL — ABNORMAL LOW (ref 0.30–0.70)
Heparin Unfractionated: 0.18 [IU]/mL — ABNORMAL LOW (ref 0.30–0.70)
Heparin Unfractionated: 0.26 [IU]/mL — ABNORMAL LOW (ref 0.30–0.70)

## 2023-12-08 LAB — HEMOGLOBIN A1C
Hgb A1c MFr Bld: 7 % — ABNORMAL HIGH (ref 4.8–5.6)
Mean Plasma Glucose: 154.2 mg/dL

## 2023-12-08 MED ORDER — MORPHINE SULFATE (PF) 2 MG/ML IV SOLN
2.0000 mg | INTRAVENOUS | Status: DC | PRN
  Administered 2023-12-08 – 2023-12-09 (×3): 2 mg via INTRAVENOUS
  Filled 2023-12-08 (×3): qty 1

## 2023-12-08 MED ORDER — MORPHINE SULFATE (PF) 2 MG/ML IV SOLN
2.0000 mg | INTRAVENOUS | Status: DC | PRN
  Administered 2023-12-08: 2 mg via INTRAVENOUS
  Filled 2023-12-08: qty 1

## 2023-12-08 MED ORDER — OXYCODONE HCL 5 MG PO TABS
5.0000 mg | ORAL_TABLET | ORAL | Status: DC | PRN
  Administered 2023-12-08 – 2023-12-09 (×5): 5 mg via ORAL
  Filled 2023-12-08 (×5): qty 1

## 2023-12-08 MED ORDER — HEPARIN BOLUS VIA INFUSION
3000.0000 [IU] | Freq: Once | INTRAVENOUS | Status: AC
  Administered 2023-12-08: 3000 [IU] via INTRAVENOUS
  Filled 2023-12-08: qty 3000

## 2023-12-08 NOTE — Progress Notes (Signed)
 PHARMACY - ANTICOAGULATION CONSULT NOTE  Pharmacy Consult for heparin Indication:  ischemic toe  Labs: Recent Labs    12/07/23 1425 12/07/23 1521 12/07/23 1650 12/08/23 0046 12/08/23 0706 12/08/23 0838 12/08/23 1801  HGB 14.1  --   --   --  12.2*  --   --   HCT 41.6  --   --   --  35.6*  --   --   PLT 199  --   --   --  165  --   --   APTT  --  24  --   --   --   --   --   LABPROT  --  12.6  --   --   --   --   --   INR  --  0.9  --   --   --   --   --   HEPARINUNFRC  --   --   --  0.10*  --  0.18* 0.26*  CREATININE 0.75  --   --   --  0.66  --   --   TROPONINIHS  --  30* 32*  --   --   --   --    Assessment: 53yo male subtherapeutic on heparin with initial dosing for ischemic toe; no infusion issues or signs of bleeding per RN.  Heparin level is still slightly subtherapeutic. Plan for angiography with possible intervention by VVS. Increase rate and check in AM.   Goal of Therapy:  Heparin level 0.3-0.7 units/ml   Plan:  Increase heparin infusion 2000 units/hr. Check level in AM  Vascular procedure in AM  Ulyses Southward, PharmD, BCIDP, AAHIVP, CPP Infectious Disease Pharmacist 12/08/2023 7:52 PM

## 2023-12-08 NOTE — Progress Notes (Signed)
 PHARMACY - ANTICOAGULATION CONSULT NOTE  Pharmacy Consult for heparin Indication:  ischemic toe  Labs: Recent Labs    12/07/23 1425 12/07/23 1521 12/07/23 1650 12/08/23 0046  HGB 14.1  --   --   --   HCT 41.6  --   --   --   PLT 199  --   --   --   APTT  --  24  --   --   LABPROT  --  12.6  --   --   INR  --  0.9  --   --   HEPARINUNFRC  --   --   --  0.10*  CREATININE 0.75  --   --   --   TROPONINIHS  --  30* 32*  --    Assessment: 53yo male subtherapeutic on heparin with initial dosing for ischemic toe; no infusion issues or signs of bleeding per RN.  Goal of Therapy:  Heparin level 0.3-0.7 units/ml   Plan:  3000 units heparin bolus. Increase heparin infusion by 4 units/kg/hr to 1500 units/hr. Check level in 6 hours.   Vernard Gambles, PharmD, BCPS 12/08/2023 1:28 AM

## 2023-12-08 NOTE — Hospital Course (Signed)
 Mark Phillips is a 53 y.o. male with medical history significant for diabetes mellitus, hypertension, tobacco use.  Patient presented to the ED with complaints of pain to his right foot particularly his big toe.  He reports about 3 weeks ago he started wearing a new pair of footwear, pain started around this time and worsened.  He attributed the worsening pain to the foot wear.  Then he noted discoloration to his first big toe.  Reports low-grade subjective fevers.   He underwent CT angio aortobifemoral scan which showed concern for possible PAD involving right lower extremity.  He was started on a heparin drip and vascular surgery was consulted.

## 2023-12-08 NOTE — Progress Notes (Signed)
 PHARMACY - ANTICOAGULATION CONSULT NOTE  Pharmacy Consult for heparin Indication:  ischemic toe  Labs: Recent Labs    12/07/23 1425 12/07/23 1521 12/07/23 1650 12/08/23 0046 12/08/23 0706 12/08/23 0838  HGB 14.1  --   --   --  12.2*  --   HCT 41.6  --   --   --  35.6*  --   PLT 199  --   --   --  165  --   APTT  --  24  --   --   --   --   LABPROT  --  12.6  --   --   --   --   INR  --  0.9  --   --   --   --   HEPARINUNFRC  --   --   --  0.10*  --  0.18*  CREATININE 0.75  --   --   --  0.66  --   TROPONINIHS  --  30* 32*  --   --   --    Assessment: 53yo male subtherapeutic on heparin with initial dosing for ischemic toe; no infusion issues or signs of bleeding per RN.  Goal of Therapy:  Heparin level 0.3-0.7 units/ml   Plan:  Increase heparin infusion by 4 units/kg/hr to 1800 units/hr. Check level in 6 hours.  Vascular procedure in AM  Thank you Okey Regal, PharmD 12/08/2023 10:21 AM

## 2023-12-08 NOTE — Plan of Care (Signed)

## 2023-12-08 NOTE — Progress Notes (Signed)
 Progress Note    Mark Phillips   MWU:[id number]  DOB: 10/04/1971  DOA: 12/07/2023     1 PCP: Pcp, No  Initial CC: right foot pain  Hospital Course: Mark Phillips is a 53 y.o. male with medical history significant for diabetes mellitus, hypertension, tobacco use.  Patient presented to the ED with complaints of pain to his right foot particularly his big toe.  He reports about 3 weeks ago he started wearing a new pair of footwear, pain started around this time and worsened.  He attributed the worsening pain to the foot wear.  Then he noted discoloration to his first big toe.  Reports low-grade subjective fevers.   He underwent CT angio aortobifemoral scan which showed concern for possible PAD involving right lower extremity.  He was started on a heparin drip and vascular surgery was consulted.  Interval History:  More comfortable this morning but still with pain in right foot. Understands plan for angiogram on 3/4. We again talked about smoking cessation.   Assessment and Plan: * Great toe pain, right - Patient presenting with pain and discoloration of right foot; no prior similar history -CT angio on admission shows concern for possible right lower extremity PAD - Vascular surgery planning on angiogram on Tuesday - Continue heparin, aspirin, statin - follow up ABIs  DM (diabetes mellitus) (HCC) - Previously on oral treatment; currently no insurance and affordability would be ongoing issue - A1c 7%, oral medications should suffice - Will need to be resumed back on metformin and ensure compliance  HTN (hypertension) - Elevated on admission and now better controlled.  Likely pain induced - Currently on no medications, monitor for any further need  Tobacco user - ongoing use; has been strongly encouraged for cessation    Old records reviewed in assessment of this patient  Antimicrobials:   DVT prophylaxis:  Heparin drip    Code Status:   Code Status: Full  Code  Mobility Assessment (Last 72 Hours)     Mobility Assessment     Row Name 12/08/23 0924 12/07/23 2050         Does patient have an order for bedrest or is patient medically unstable No - Continue assessment No - Continue assessment      What is the highest level of mobility based on the progressive mobility assessment? Level 6 (Walks independently in room and hall) - Balance while walking in room without assist - Complete Level 6 (Walks independently in room and hall) - Balance while walking in room without assist - Complete               Barriers to discharge: none Disposition Plan:  Home  HH orders placed: n/a Status is: Inpt  Objective: Blood pressure 131/87, pulse 75, temperature 98.5 F (36.9 C), resp. rate 19, height 5\' 11"  (1.803 m), weight 74.8 kg, SpO2 95%.  Examination:  Physical Exam Constitutional:      General: He is not in acute distress.    Appearance: Normal appearance.  HENT:     Head: Normocephalic and atraumatic.     Mouth/Throat:     Mouth: Mucous membranes are moist.  Eyes:     Extraocular Movements: Extraocular movements intact.  Cardiovascular:     Rate and Rhythm: Normal rate and regular rhythm.  Pulmonary:     Effort: Pulmonary effort is normal. No respiratory distress.     Breath sounds: Normal breath sounds. No wheezing.  Abdominal:     General: Bowel  sounds are normal. There is no distension.     Palpations: Abdomen is soft.     Tenderness: There is no abdominal tenderness.  Musculoskeletal:     Cervical back: Normal range of motion and neck supple.     Comments: Right foot noted with dusky skin around great toe and mild around 5th digit; also discoloration at heel; associated tenderness as well  Skin:    General: Skin is warm and dry.  Neurological:     General: No focal deficit present.     Mental Status: He is alert.  Psychiatric:        Mood and Affect: Mood normal.        Behavior: Behavior normal.      Consultants:   Vascular Surgery   Procedures:    Data Reviewed: Results for orders placed or performed during the hospital encounter of 12/07/23 (from the past 24 hours)  CBC with Differential     Status: Abnormal   Collection Time: 12/07/23  2:25 PM  Result Value Ref Range   WBC 12.9 (H) 4.0 - 10.5 K/uL   RBC 4.70 4.22 - 5.81 MIL/uL   Hemoglobin 14.1 13.0 - 17.0 g/dL   HCT 40.9 81.1 - 91.4 %   MCV 88.5 80.0 - 100.0 fL   MCH 30.0 26.0 - 34.0 pg   MCHC 33.9 30.0 - 36.0 g/dL   RDW 78.2 95.6 - 21.3 %   Platelets 199 150 - 400 K/uL   nRBC 0.0 0.0 - 0.2 %   Neutrophils Relative % 57 %   Neutro Abs 7.5 1.7 - 7.7 K/uL   Lymphocytes Relative 29 %   Lymphs Abs 3.7 0.7 - 4.0 K/uL   Monocytes Relative 8 %   Monocytes Absolute 1.0 0.1 - 1.0 K/uL   Eosinophils Relative 5 %   Eosinophils Absolute 0.7 (H) 0.0 - 0.5 K/uL   Basophils Relative 1 %   Basophils Absolute 0.1 0.0 - 0.1 K/uL   Immature Granulocytes 0 %   Abs Immature Granulocytes 0.04 0.00 - 0.07 K/uL  Basic metabolic panel     Status: Abnormal   Collection Time: 12/07/23  2:25 PM  Result Value Ref Range   Sodium 137 135 - 145 mmol/L   Potassium 3.6 3.5 - 5.1 mmol/L   Chloride 98 98 - 111 mmol/L   CO2 26 22 - 32 mmol/L   Glucose, Bld 236 (H) 70 - 99 mg/dL   BUN 8 6 - 20 mg/dL   Creatinine, Ser 0.86 0.61 - 1.24 mg/dL   Calcium 9.3 8.9 - 57.8 mg/dL   GFR, Estimated >46 >96 mL/min   Anion gap 13 5 - 15  Troponin I (High Sensitivity)     Status: Abnormal   Collection Time: 12/07/23  3:21 PM  Result Value Ref Range   Troponin I (High Sensitivity) 30 (H) <18 ng/L  Protime-INR     Status: None   Collection Time: 12/07/23  3:21 PM  Result Value Ref Range   Prothrombin Time 12.6 11.4 - 15.2 seconds   INR 0.9 0.8 - 1.2  APTT     Status: None   Collection Time: 12/07/23  3:21 PM  Result Value Ref Range   aPTT 24 24 - 36 seconds  Troponin I (High Sensitivity)     Status: Abnormal   Collection Time: 12/07/23  4:50 PM  Result Value Ref  Range   Troponin I (High Sensitivity) 32 (H) <18 ng/L  Lactic acid, plasma  Status: None   Collection Time: 12/07/23  4:50 PM  Result Value Ref Range   Lactic Acid, Venous 1.0 0.5 - 1.9 mmol/L  Glucose, capillary     Status: Abnormal   Collection Time: 12/07/23  9:41 PM  Result Value Ref Range   Glucose-Capillary 143 (H) 70 - 99 mg/dL  Culture, blood (Routine X 2) w Reflex to ID Panel     Status: None (Preliminary result)   Collection Time: 12/08/23 12:46 AM   Specimen: BLOOD LEFT ARM  Result Value Ref Range   Specimen Description BLOOD LEFT ARM    Special Requests      BOTTLES DRAWN AEROBIC AND ANAEROBIC Blood Culture results may not be optimal due to an inadequate volume of blood received in culture bottles   Culture      NO GROWTH < 12 HOURS Performed at [facility], [address] [place], [place] [address]    Report Status PENDING   Culture, blood (Routine X 2) w Reflex to ID Panel     Status: None (Preliminary result)   Collection Time: 12/08/23 12:46 AM   Specimen: BLOOD LEFT HAND  Result Value Ref Range   Specimen Description BLOOD LEFT HAND    Special Requests      BOTTLES DRAWN AEROBIC AND ANAEROBIC Blood Culture results may not be optimal due to an inadequate volume of blood received in culture bottles   Culture      NO GROWTH < 12 HOURS Performed at [facility], [address], [place], [place] [address]    Report Status PENDING   Heparin level (unfractionated)     Status: Abnormal   Collection Time: 12/08/23 12:46 AM  Result Value Ref Range   Heparin Unfractionated 0.10 (L) 0.30 - 0.70 IU/mL  Glucose, capillary     Status: Abnormal   Collection Time: 12/08/23  5:18 AM  Result Value Ref Range   Glucose-Capillary 120 (H) 70 - 99 mg/dL  HIV Antibody (routine testing w rflx)     Status: None   Collection Time: 12/08/23  7:06 AM  Result Value Ref Range   HIV Screen 4th Generation wRfx Non Reactive Non Reactive  Basic metabolic panel     Status:  Abnormal   Collection Time: 12/08/23  7:06 AM  Result Value Ref Range   Sodium 136 135 - 145 mmol/L   Potassium 3.3 (L) 3.5 - 5.1 mmol/L   Chloride 102 98 - 111 mmol/L   CO2 24 22 - 32 mmol/L   Glucose, Bld 106 (H) 70 - 99 mg/dL   BUN 5 (L) 6 - 20 mg/dL   Creatinine, Ser 9.62 0.61 - 1.24 mg/dL   Calcium 8.0 (L) 8.9 - 10.3 mg/dL   GFR, Estimated >95 >28 mL/min   Anion gap 10 5 - 15  Hemoglobin A1c     Status: Abnormal   Collection Time: 12/08/23  7:06 AM  Result Value Ref Range   Hgb A1c MFr Bld 7.0 (H) 4.8 - 5.6 %   Mean Plasma Glucose 154.2 mg/dL  Lipid panel     Status: Abnormal   Collection Time: 12/08/23  7:06 AM  Result Value Ref Range   Cholesterol 174 0 - 200 mg/dL   Triglycerides 413 (H) <150 mg/dL   HDL 22 (L) >24 mg/dL   Total CHOL/HDL Ratio 7.9 RATIO   VLDL 32 0 - 40 mg/dL   LDL Cholesterol 401 (H) 0 - 99 mg/dL  CBC  Status: Abnormal   Collection Time: 12/08/23  7:06 AM  Result Value Ref Range   WBC 12.6 (H) 4.0 - 10.5 K/uL   RBC 4.05 (L) 4.22 - 5.81 MIL/uL   Hemoglobin 12.2 (L) 13.0 - 17.0 g/dL   HCT 82.9 (L) 56.2 - 13.0 %   MCV 87.9 80.0 - 100.0 fL   MCH 30.1 26.0 - 34.0 pg   MCHC 34.3 30.0 - 36.0 g/dL   RDW 86.5 78.4 - 69.6 %   Platelets 165 150 - 400 K/uL   nRBC 0.0 0.0 - 0.2 %  Glucose, capillary     Status: Abnormal   Collection Time: 12/08/23  7:46 AM  Result Value Ref Range   Glucose-Capillary 120 (H) 70 - 99 mg/dL  Heparin level (unfractionated)     Status: Abnormal   Collection Time: 12/08/23  8:38 AM  Result Value Ref Range   Heparin Unfractionated 0.18 (L) 0.30 - 0.70 IU/mL  Glucose, capillary     Status: Abnormal   Collection Time: 12/08/23 10:26 AM  Result Value Ref Range   Glucose-Capillary 115 (H) 70 - 99 mg/dL    I have reviewed pertinent nursing notes, vitals, labs, and images as necessary. I have ordered labwork to follow up on as indicated.  I have reviewed the last notes from staff over past 24 hours. I have discussed  patient's care plan and test results with nursing staff, CM/SW, and other staff as appropriate.  Time spent: Greater than 50% of the 55 minute visit was spent in counseling/coordination of care for the patient as laid out in the A&P.   LOS: 1 day   Lewie Chamber, MD [facility] 12/08/2023, 12:28 PM

## 2023-12-08 NOTE — Assessment & Plan Note (Signed)
-   ongoing use; has been strongly encouraged for cessation

## 2023-12-08 NOTE — Progress Notes (Signed)
 [facility]  Subjective  - No change with right GT.   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 No evidence of high-grade stenosis, and Focal high-grade stenosis within the mid right SFA, estimated greater than 70%.  Plan will be for angiography and possible intervention.   NPO past MN and consent ordered Genteel hydration and Heparin have been started.  He has been started on ASA with daily Statin for medical therapy.  Pending interventions he may need to be on Dual anti platelets.   Pending ABI.  Patient agrees to proceed.   Mosetta Pigeon 12/08/2023 6:57 AM --  Laboratory Lab Results: Recent Labs    12/07/23 1425  WBC 12.9*  HGB 14.1  HCT 41.6  PLT 199   BMET Recent Labs    12/07/23 1425  NA 137  K 3.6  CL 98  CO2 26  GLUCOSE 236*  BUN 8  CREATININE 0.75  CALCIUM 9.3    COAG Lab Results  Component Value Date   INR 0.9 12/07/2023   No results found for: "PTT"

## 2023-12-08 NOTE — Progress Notes (Signed)
 VASCULAR LAB    ABI has been performed.  See CV proc for preliminary results.   Tejasvi Brissett, RVT 12/08/2023, 11:40 AM

## 2023-12-09 ENCOUNTER — Other Ambulatory Visit (HOSPITAL_COMMUNITY): Payer: Self-pay

## 2023-12-09 ENCOUNTER — Encounter (HOSPITAL_COMMUNITY): Payer: Self-pay | Admitting: Surgery

## 2023-12-09 ENCOUNTER — Encounter (HOSPITAL_COMMUNITY)
Admission: EM | Disposition: A | Discharge status: Home / Self Care | Payer: Self-pay | Source: Home / Self Care | Attending: Internal Medicine

## 2023-12-09 DIAGNOSIS — I739 Peripheral vascular disease, unspecified: Secondary | ICD-10-CM

## 2023-12-09 HISTORY — PX: ABDOMINAL AORTOGRAM W/LOWER EXTREMITY: CATH118223

## 2023-12-09 HISTORY — PX: PERIPHERAL VASCULAR BALLOON ANGIOPLASTY: CATH118281

## 2023-12-09 LAB — BASIC METABOLIC PANEL
Anion gap: 12 (ref 5–15)
BUN: <5 mg/dL — ABNORMAL LOW (ref 6–20)
CO2: 24 mmol/L (ref 22–32)
Calcium: 8.9 mg/dL (ref 8.9–10.3)
Chloride: 99 mmol/L (ref 98–111)
Creatinine, Ser: 0.72 mg/dL (ref 0.61–1.24)
GFR, Estimated: >60 mL/min (ref >60)
Glucose, Bld: 102 mg/dL — ABNORMAL HIGH (ref 70–99)
Potassium: 3.5 mmol/L (ref 3.5–5.1)
Sodium: 135 mmol/L (ref 135–145)

## 2023-12-09 LAB — HEPARIN LEVEL (UNFRACTIONATED): Heparin Unfractionated: 0.54 [IU]/mL (ref 0.30–0.70)

## 2023-12-09 LAB — CBC
HCT: 35.2 % — ABNORMAL LOW (ref 39.0–52.0)
Hemoglobin: 12.1 g/dL — ABNORMAL LOW (ref 13.0–17.0)
MCH: 30.1 pg (ref 26.0–34.0)
MCHC: 34.4 g/dL (ref 30.0–36.0)
MCV: 87.6 fL (ref 80.0–100.0)
Platelets: 160 K/uL (ref 150–400)
RBC: 4.02 MIL/uL — ABNORMAL LOW (ref 4.22–5.81)
RDW: 13.1 % (ref 11.5–15.5)
WBC: 11.9 K/uL — ABNORMAL HIGH (ref 4.0–10.5)
nRBC: 0 % (ref 0.0–0.2)

## 2023-12-09 LAB — GLUCOSE, CAPILLARY
Glucose-Capillary: 102 mg/dL — ABNORMAL HIGH (ref 70–99)
Glucose-Capillary: 97 mg/dL (ref 70–99)

## 2023-12-09 SURGERY — ABDOMINAL AORTOGRAM W/LOWER EXTREMITY
Anesthesia: LOCAL

## 2023-12-09 MED ORDER — ATORVASTATIN CALCIUM 40 MG PO TABS
40.0000 mg | ORAL_TABLET | Freq: Every day | ORAL | 3 refills | Status: AC
Start: 2023-12-10 — End: ?

## 2023-12-09 MED ORDER — MIDAZOLAM HCL 2 MG/2ML IJ SOLN
INTRAMUSCULAR | Status: AC
  Filled 2023-12-09: qty 2

## 2023-12-09 MED ORDER — HEPARIN SODIUM (PORCINE) 1000 UNIT/ML IJ SOLN
INTRAMUSCULAR | Status: DC | PRN
  Administered 2023-12-09: 7000 [IU] via INTRAVENOUS

## 2023-12-09 MED ORDER — HYDRALAZINE HCL 20 MG/ML IJ SOLN: 5.0000 mg | INTRAMUSCULAR | Status: DC | PRN

## 2023-12-09 MED ORDER — FENTANYL CITRATE (PF) 100 MCG/2ML IJ SOLN
INTRAMUSCULAR | Status: DC | PRN
  Administered 2023-12-09: 50 ug via INTRAVENOUS
  Administered 2023-12-09: 25 ug via INTRAVENOUS

## 2023-12-09 MED ORDER — ASPIRIN 81 MG PO TBEC
81.0000 mg | DELAYED_RELEASE_TABLET | Freq: Every day | ORAL | Status: DC
  Filled 2023-12-09: qty 1

## 2023-12-09 MED ORDER — HEPARIN SODIUM (PORCINE) 1000 UNIT/ML IJ SOLN
INTRAMUSCULAR | Status: AC
  Filled 2023-12-09: qty 10

## 2023-12-09 MED ORDER — OXYCODONE HCL 5 MG PO TABS: 5.0000 mg | ORAL_TABLET | Freq: Four times a day (QID) | ORAL | 0 refills | Status: DC | PRN

## 2023-12-09 MED ORDER — SODIUM CHLORIDE 0.9 % WEIGHT BASED INFUSION: 1.0000 mL/kg/h | INTRAVENOUS | Status: DC

## 2023-12-09 MED ORDER — CLOPIDOGREL BISULFATE 300 MG PO TABS
ORAL_TABLET | ORAL | Status: AC
  Filled 2023-12-09: qty 1

## 2023-12-09 MED ORDER — ASPIRIN 81 MG PO CHEW
CHEWABLE_TABLET | ORAL | Status: AC
  Filled 2023-12-09: qty 1

## 2023-12-09 MED ORDER — FENTANYL CITRATE (PF) 100 MCG/2ML IJ SOLN
INTRAMUSCULAR | Status: AC
  Filled 2023-12-09: qty 2

## 2023-12-09 MED ORDER — ACETAMINOPHEN 325 MG PO TABS: 650.0000 mg | ORAL_TABLET | ORAL | Status: DC | PRN

## 2023-12-09 MED ORDER — ASPIRIN 81 MG PO CHEW
CHEWABLE_TABLET | ORAL | Status: DC | PRN
  Administered 2023-12-09: 81 mg via ORAL

## 2023-12-09 MED ORDER — MIDAZOLAM HCL 2 MG/2ML IJ SOLN
INTRAMUSCULAR | Status: DC | PRN
  Administered 2023-12-09: 2 mg via INTRAVENOUS
  Administered 2023-12-09: 1 mg via INTRAVENOUS

## 2023-12-09 MED ORDER — SODIUM CHLORIDE 0.9% FLUSH: 3.0000 mL | INTRAVENOUS | Status: DC | PRN

## 2023-12-09 MED ORDER — MORPHINE SULFATE (PF) 2 MG/ML IV SOLN
2.0000 mg | INTRAVENOUS | Status: DC | PRN
Start: 2023-12-09 — End: 2023-12-10

## 2023-12-09 MED ORDER — HEPARIN (PORCINE) IN NACL 1000-0.9 UT/500ML-% IV SOLN
INTRAVENOUS | Status: DC | PRN
  Administered 2023-12-09 (×2): 500 mL

## 2023-12-09 MED ORDER — LIDOCAINE HCL (PF) 1 % IJ SOLN
INTRAMUSCULAR | Status: DC | PRN
  Administered 2023-12-09: 15 mL via INTRADERMAL

## 2023-12-09 MED ORDER — ASPIRIN 81 MG PO TBEC: 81.0000 mg | DELAYED_RELEASE_TABLET | Freq: Every day | ORAL | Status: AC

## 2023-12-09 MED ORDER — CLOPIDOGREL BISULFATE 300 MG PO TABS
ORAL_TABLET | ORAL | Status: DC | PRN
  Administered 2023-12-09: 300 mg via ORAL

## 2023-12-09 MED ORDER — SODIUM CHLORIDE 0.9% FLUSH
3.0000 mL | Freq: Two times a day (BID) | INTRAVENOUS | Status: DC
Start: 2023-12-10 — End: 2023-12-10

## 2023-12-09 MED ORDER — CLOPIDOGREL BISULFATE 75 MG PO TABS: 75.0000 mg | ORAL_TABLET | Freq: Every day | ORAL | Status: DC

## 2023-12-09 MED ORDER — LIDOCAINE HCL (PF) 1 % IJ SOLN
INTRAMUSCULAR | Status: AC
  Filled 2023-12-09: qty 30

## 2023-12-09 MED ORDER — LABETALOL HCL 5 MG/ML IV SOLN: 10.0000 mg | INTRAVENOUS | Status: DC | PRN

## 2023-12-09 MED ORDER — CLOPIDOGREL BISULFATE 75 MG PO TABS: 75.0000 mg | ORAL_TABLET | Freq: Every day | ORAL | 3 refills | Status: AC

## 2023-12-09 MED ORDER — IODIXANOL 320 MG/ML IV SOLN
INTRAVENOUS | Status: DC | PRN
  Administered 2023-12-09: 60 mL

## 2023-12-09 MED ORDER — SODIUM CHLORIDE 0.9 % IV SOLN: 250.0000 mL | INTRAVENOUS | Status: DC | PRN

## 2023-12-09 SURGICAL SUPPLY — 19 items
BAG SNAP BAND KOVER 36X36 (MISCELLANEOUS) IMPLANT
BALLN MUSTANG 6.0X40 135 (BALLOONS) ×2 IMPLANT
BALLOON MUSTANG 6.0X40 135 (BALLOONS) IMPLANT
CATH OMNI FLUSH 5F 65CM (CATHETERS) IMPLANT
COVER DOME SNAP 22 D (MISCELLANEOUS) IMPLANT
DEVICE VASC CLSR CELT ART 7 (Vascular Products) IMPLANT
GLIDEWIRE ADV .035X260CM (WIRE) IMPLANT
KIT ENCORE 26 ADVANTAGE (KITS) IMPLANT
KIT MICROPUNCTURE NIT STIFF (SHEATH) IMPLANT
SET ATX-X65L (MISCELLANEOUS) IMPLANT
SHEATH CATAPULT 7FR 45 (SHEATH) IMPLANT
SHEATH PINNACLE 5F 10CM (SHEATH) IMPLANT
SHEATH PINNACLE 7F 10CM (SHEATH) IMPLANT
SHEATH PROBE COVER 6X72 (BAG) IMPLANT
STENT ELUVIA 7X60X130 (Permanent Stent) IMPLANT
STENT VIABAHN 8X59 7FR 80 (Permanent Stent) IMPLANT
STENT VIABAHN 9X39 7FR 80 (Permanent Stent) IMPLANT
TRAY PV CATH (CUSTOM PROCEDURE TRAY) ×2 IMPLANT
WIRE BENTSON .035X145CM (WIRE) IMPLANT

## 2023-12-09 NOTE — Discharge Summary (Signed)
 Physician Discharge Summary   Mark Phillips:[id number] DOB: 1971/04/21 DOA: 12/07/2023  PCP: Oneita Hurt, No  Admit date: 12/07/2023 Discharge date: 12/09/2023  Admitted From: Home Disposition:  Home Discharging physician: Lewie Chamber, MD Barriers to discharge: none  Recommendations at discharge: Follow up with vascular surgery   Discharge Condition: stable CODE STATUS: Full  Diet recommendation:  Diet Orders (From admission, onward)     Start     Ordered   12/09/23 1504  Diet regular Room service appropriate? Yes; Fluid consistency: Thin  Diet effective now       Question Answer Comment  Room service appropriate? Yes   Fluid consistency: Thin      12/09/23 1503   12/09/23 0000  Diet general        12/09/23 1537            Hospital Course: Mark Phillips is a 53 y.o. male with medical history significant for diabetes mellitus, hypertension, tobacco use.  Patient presented to the ED with complaints of pain to his right foot particularly his big toe.  He reports about 3 weeks ago he started wearing a new pair of footwear, pain started around this time and worsened.  He attributed the worsening pain to the foot wear.  Then he noted discoloration to his first big toe.  Reports low-grade subjective fevers.   He underwent CT angio aortobifemoral scan which showed concern for possible PAD involving right lower extremity.  He was started on a heparin drip and vascular surgery was consulted.  Assessment and Plan: * PAD (peripheral artery disease) (HCC) - Patient presenting with pain and discoloration of right foot; no prior similar history -CT angio on admission shows concern for possible right lower extremity PAD - Vascular surgery planning on angiogram on 3/4 - angiogram abnormal and patient underwent stent placement to right SFA and right common iliac artery - continue on aspirin, statin, Plavix at discharge  DM (diabetes mellitus) (HCC) - Previously on oral treatment;  currently no insurance and affordability would be ongoing issue - A1c 7%, oral medications should suffice - Will need to be resumed back on metformin and ensure compliance  HTN (hypertension) - Elevated on admission and now better controlled.  Likely pain induced - Currently on no medications, monitor for any further need  Tobacco user - ongoing use; has been strongly encouraged for cessation       The patient's acute and chronic medical conditions were treated accordingly. On day of discharge, patient was felt deemed stable for discharge. Patient/family member advised to call PCP or come back to ER if needed.   Principal Diagnosis: PAD (peripheral artery disease) (HCC)  Discharge Diagnoses: Active Hospital Problems   Diagnosis Date Noted   PAD (peripheral artery disease) (HCC) 12/07/2023    Priority: 1.   DM (diabetes mellitus) (HCC) 12/07/2023    Priority: 2.   HTN (hypertension) 12/07/2023   OSA (obstructive sleep apnea) 07/04/2016   Tobacco user 07/04/2016    Resolved Hospital Problems  No resolved problems to display.     Discharge Instructions     Diet general   Complete by: As directed    Increase activity slowly   Complete by: As directed       Allergies as of 12/09/2023       Reactions   Ventolin [albuterol] Swelling   Immediate lip swelling        Medication List     TAKE these medications    aspirin EC 81  MG tablet Take 1 tablet (81 mg total) by mouth daily. Swallow whole.   atorvastatin 40 MG tablet Commonly known as: LIPITOR Take 1 tablet (40 mg total) by mouth daily. Start taking on: December 10, 2023   clopidogrel 75 MG tablet Commonly known as: PLAVIX Take 1 tablet (75 mg total) by mouth daily with breakfast. Start taking on: December 10, 2023   ibuprofen 200 MG tablet Commonly known as: ADVIL Take 800 mg by mouth 2 (two) times daily as needed for headache or moderate pain (pain score 4-6).   metFORMIN 1000 MG tablet Commonly known as:  GLUCOPHAGE TAKE 1 Tablet  BY MOUTH TWICE DAILY WITH A MEAL What changed:  when to take this reasons to take this   oxyCODONE 5 MG immediate release tablet Commonly known as: Oxy IR/ROXICODONE Take 1 tablet (5 mg total) by mouth every 6 (six) hours as needed for moderate pain (pain score 4-6) or severe pain (pain score 7-10).        Allergies  Allergen Reactions   Ventolin [Albuterol] Swelling    Immediate lip swelling    Consultations: Vascular surgery  Procedures: 12/09/23: Procedure Performed:            1.  Ultrasound-guided access, left femoral artery            2.  Abdominal aortogram            3.  Right lower extremity angiogram            4.  Catheter selection right external iliac artery            5.  Stent, right superficial femoral artery (7 x 60 Eluvia)            6.  Stent, right common iliac artery (9 x 39 Eluvia            7.  Conscious sedation, 49 minutes             8.  Closure device, Celt   Discharge Exam: BP (!) 126/97 (BP Location: Left Arm)   Pulse 71   Temp 98.6 F (37 C) (Oral)   Resp 17   Ht 5\' 11"  (1.803 m)   Wt 74.8 kg   SpO2 98%   BMI 23.01 kg/m  Physical Exam Constitutional:      General: He is not in acute distress.    Appearance: Normal appearance.  HENT:     Head: Normocephalic and atraumatic.     Mouth/Throat:     Mouth: Mucous membranes are moist.  Eyes:     Extraocular Movements: Extraocular movements intact.  Cardiovascular:     Rate and Rhythm: Normal rate and regular rhythm.  Pulmonary:     Effort: Pulmonary effort is normal. No respiratory distress.     Breath sounds: Normal breath sounds. No wheezing.  Abdominal:     General: Bowel sounds are normal. There is no distension.     Palpations: Abdomen is soft.     Tenderness: There is no abdominal tenderness.  Musculoskeletal:     Cervical back: Normal range of motion and neck supple.     Comments: Right foot noted with dusky skin around great toe and mild around  5th digit; also discoloration at heel; associated tenderness as well  Skin:    General: Skin is warm and dry.  Neurological:     General: No focal deficit present.     Mental Status: He is alert.  Psychiatric:  Mood and Affect: Mood normal.        Behavior: Behavior normal.      The results of significant diagnostics from this hospitalization (including imaging, microbiology, ancillary and laboratory) are listed below for reference.   Microbiology: Recent Results (from the past 240 hours)  Culture, blood (Routine X 2) w Reflex to ID Panel     Status: None (Preliminary result)   Collection Time: 12/08/23 12:46 AM   Specimen: BLOOD LEFT ARM  Result Value Ref Range Status   Specimen Description BLOOD LEFT ARM  Final   Special Requests   Final    BOTTLES DRAWN AEROBIC AND ANAEROBIC Blood Culture results may not be optimal due to an inadequate volume of blood received in culture bottles   Culture   Final    NO GROWTH 1 DAY Performed at [facility], [address], [place], [place] [address]    Report Status PENDING  Incomplete  Culture, blood (Routine X 2) w Reflex to ID Panel     Status: None (Preliminary result)   Collection Time: 12/08/23 12:46 AM   Specimen: BLOOD LEFT HAND  Result Value Ref Range Status   Specimen Description BLOOD LEFT HAND  Final   Special Requests   Final    BOTTLES DRAWN AEROBIC AND ANAEROBIC Blood Culture results may not be optimal due to an inadequate volume of blood received in culture bottles   Culture   Final    NO GROWTH 1 DAY Performed at [facility], [address] [place], [place] [address]    Report Status PENDING  Incomplete     Labs: BNP (last 3 results) No results for input(s): "BNP" in the last 8760 hours. Basic Metabolic Panel: Recent Labs  Lab 12/07/23 1425 12/08/23 0706 12/09/23 1059  NA 137 136 135  K 3.6 3.3* 3.5  CL 98 102 99  CO2 26 24 24   GLUCOSE 236* 106* 102*  BUN 8 5* <5*  CREATININE 0.75 0.66  0.72  CALCIUM 9.3 8.0* 8.9   Liver Function Tests: No results for input(s): "AST", "ALT", "ALKPHOS", "BILITOT", "PROT", "ALBUMIN" in the last 168 hours. No results for input(s): "LIPASE", "AMYLASE" in the last 168 hours. No results for input(s): "AMMONIA" in the last 168 hours. CBC: Recent Labs  Lab 12/07/23 1425 12/08/23 0706 12/09/23 0658  WBC 12.9* 12.6* 11.9*  NEUTROABS 7.5  --   --   HGB 14.1 12.2* 12.1*  HCT 41.6 35.6* 35.2*  MCV 88.5 87.9 87.6  PLT 199 165 160   Cardiac Enzymes: No results for input(s): "CKTOTAL", "CKMB", "CKMBINDEX", "TROPONINI" in the last 168 hours. BNP: Invalid input(s): "POCBNP" CBG: Recent Labs  Lab 12/08/23 1026 12/08/23 1548 12/08/23 1825 12/08/23 2154 12/09/23 0545  GLUCAP 115* 137* 148* 113* 102*   D-Dimer No results for input(s): "DDIMER" in the last 72 hours. Hgb A1c Recent Labs    12/08/23 0706  HGBA1C 7.0*   Lipid Profile Recent Labs    12/08/23 0706  CHOL 174  HDL 22*  LDLCALC 120*  TRIG 159*  CHOLHDL 7.9   Thyroid function studies No results for input(s): "TSH", "T4TOTAL", "T3FREE", "THYROIDAB" in the last 72 hours.  Invalid input(s): "FREET3" Anemia work up No results for input(s): "VITAMINB12", "FOLATE", "FERRITIN", "TIBC", "IRON", "RETICCTPCT" in the last 72 hours. Urinalysis No results found for: "COLORURINE", "APPEARANCEUR", "LABSPEC", "PHURINE", "GLUCOSEU", "HGBUR", "BILIRUBINUR", "KETONESUR", "PROTEINUR", "UROBILINOGEN", "NITRITE", "LEUKOCYTESUR" Sepsis Labs Recent Labs  Lab 12/07/23 1425 12/08/23 0706 12/09/23 6295  WBC 12.9* 12.6* 11.9*   Microbiology Recent Results (from the past 240 hours)  Culture, blood (Routine X 2) w Reflex to ID Panel     Status: None (Preliminary result)   Collection Time: 12/08/23 12:46 AM   Specimen: BLOOD LEFT ARM  Result Value Ref Range Status   Specimen Description BLOOD LEFT ARM  Final   Special Requests   Final    BOTTLES DRAWN AEROBIC AND ANAEROBIC Blood  Culture results may not be optimal due to an inadequate volume of blood received in culture bottles   Culture   Final    NO GROWTH 1 DAY Performed at [facility], [address], [place], [place] [address]    Report Status PENDING  Incomplete  Culture, blood (Routine X 2) w Reflex to ID Panel     Status: None (Preliminary result)   Collection Time: 12/08/23 12:46 AM   Specimen: BLOOD LEFT HAND  Result Value Ref Range Status   Specimen Description BLOOD LEFT HAND  Final   Special Requests   Final    BOTTLES DRAWN AEROBIC AND ANAEROBIC Blood Culture results may not be optimal due to an inadequate volume of blood received in culture bottles   Culture   Final    NO GROWTH 1 DAY Performed at [facility], [address] [place], [place] [address]    Report Status PENDING  Incomplete    Procedures/Studies: PERIPHERAL VASCULAR CATHETERIZATION Result Date: 12/09/2023 Images from the original result were not included. Patient name: Mark Phillips MRN: [id number] DOB: [birth year] Sex: male 12/09/2023 Pre-operative Diagnosis: Right blue toe syndrome Post-operative diagnosis:  Same Surgeon:  Durene Cal Procedure Performed:  1.  Ultrasound-guided access, left femoral artery  2.  Abdominal aortogram  3.  Right lower extremity angiogram  4.  Catheter selection right external iliac artery  5.  Stent, right superficial femoral artery (7 x 60 Eluvia)  6.  Stent, right common iliac artery (9 x 39 Eluvia  7.  Conscious sedation, 49 minutes  8.  Closure device, Celt Indications: This is a 53 year old gentleman who presented to the hospital with a painful right blue toe.  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  He is here for further evaluation of possible intervention Procedure:  The patient was identified in the holding area and taken to room 8.  The patient was then placed supine on the table and prepped and draped in the usual sterile fashion.  A time out was called.  Conscious  sedation was administered with the use of IV fentanyl and Versed under continuous physician and nurse monitoring.  Heart rate, blood pressure, and oxygen saturation were continuously monitored.  Total sedation time was 49 minutes.  Ultrasound was used to evaluate the left common femoral artery.  It was patent .  A digital ultrasound image was acquired.  A micropuncture needle was used to access the left common femoral artery under ultrasound guidance.  An 018 wire was advanced without resistance and a micropuncture sheath was placed.  The 018 wire was removed and a benson wire was placed.  The micropuncture sheath was exchanged for a 5 french sheath.  An omniflush catheter was advanced over the wire to the level of L-1.  An abdominal angiogram was obtained.  Next, using the omniflush catheter and a benson wire, the aortic bifurcation was crossed and the catheter was placed into theright external iliac artery and right runoff was obtained. Findings:  Aortogram: No significant renal artery stenosis was visualized.  The infrarenal abdominal aorta is widely patent.  The left common and external iliac arteries are widely patent.  Luminal valleculae artery with at least 50% stenosis is visualized in the right common iliac artery.  The extrailiac artery is widely patent  Right Lower Extremity: The right common femoral and profundofemoral artery are widely patent.  The superficial femoral artery is patent however in the middle third of the SFA there is a short segment approximately 3 cm lesion with 80% stenosis.  The remaining superficial femoral-popliteal artery are mildly diseased.  There is three-vessel runoff.  There is early disease out onto the foot  Left Lower Extremity: Not evaluated Intervention: After the above images were acquired the decision was made to proceed with intervention.  A 745 sheath was advanced into the right external iliac artery and the patient was fully heparinized.  I crossed the lesion in the  superficial femoral artery with a Glidewire advantage.  I elected to primarily stent this.  A 7 x 60 Eluvia stent was deployed across the lesion which was postdilated with a 6 mm balloon.  Completion imaging showed complete resolution of the stenosis and no change in runoff. Attention was then turned towards the iliac lesion.  I deployed a 9 x 39 VBX stent across this lesion with no residual stenosis.  The groin was closed with a Celt Impression:  #1  Short segment greater than 80% stenosis within the right superficial femoral artery successfully treated using a 7 mm Eluvia stent  #2  Focal luminal irregularity in the right common iliac artery treated using a VBX 9 x 39.  This was the area of concern for dissection on CT scan  #3  Patient will be started on aspirin and Plavix.  He will not need heparin or anticoagulation  V. Durene Cal, M.D., [facility] [place] Office: [phone] Pager:  [phone]   VAS [place] ABI WITH/WO TBI Result Date: 12/08/2023  LOWER EXTREMITY DOPPLER STUDY Patient Name:  JUSTYCE BABY  Date of Exam:   12/08/2023 Medical Rec #: [id number]        Accession #:    [id number] Date of Birth: 1971-09-01        Patient Gender: M Patient Age:   53 years Exam Location:  [facility] Procedure:      VAS [place] ABI WITH/WO TBI Referring Phys: Coral Else --------------------------------------------------------------------------------  Indications: Ischemic right great toe pain High Risk Factors: Hyperlipidemia, Diabetes, current smoker. Other Factors: CTA indicated Focal ulcerative plaque versus chronic short                segment dissection                within the right common iliac artery. Duplicated bilateral renal                arteries, with high-grade ostial stenosis at the origin of the                inferior renal arteries bilaterally, estimated greater than 70%.                Focal high-grade stenosis within the mid right SFA, estimated                 greater than 70%.  Limitations: Today's exam was limited due to Right toe pain. Comparison Study: No prior study on file Performing Technologist: Sherren Kerns RVS  Examination Guidelines: A complete evaluation includes at minimum, Doppler waveform signals and systolic blood pressure reading at the level of bilateral brachial, anterior tibial, and posterior tibial arteries, when vessel segments are accessible. Bilateral testing is considered an integral part of a complete examination. Photoelectric Plethysmograph (PPG) waveforms and toe systolic pressure readings are included as required and additional duplex testing as needed. Limited examinations for reoccurring indications may be performed as noted.  ABI Findings: +---------+------------------+-----+----------+--------+ Right    Rt Pressure (mmHg)IndexWaveform  Comment  +---------+------------------+-----+----------+--------+ Brachial 135                    triphasic          +---------+------------------+-----+----------+--------+ PTA      112               0.82 biphasic           +---------+------------------+-----+----------+--------+ DP       98                0.72 monophasic         +---------+------------------+-----+----------+--------+ Great Toe                       Abnormal  2nd toe  +---------+------------------+-----+----------+--------+ +---------+------------------+-----+---------+-------+ Left     Lt Pressure (mmHg)IndexWaveform Comment +---------+------------------+-----+---------+-------+ Brachial 136                    triphasic        +---------+------------------+-----+---------+-------+ PTA      144               1.06 biphasic         +---------+------------------+-----+---------+-------+ DP       143               1.05 biphasic         +---------+------------------+-----+---------+-------+ Great Toe120               0.88 Normal            +---------+------------------+-----+---------+-------+ +-------+-----------+-------------------------+------------+------------+ ABI/TBIToday's ABIToday's TBI              Previous ABIPrevious TBI +-------+-----------+-------------------------+------------+------------+ Right  0.82       abnormal 2nd toe waveform                         +-------+-----------+-------------------------+------------+------------+ Left   1.06       0.88                                              +-------+-----------+-------------------------+------------+------------+  Summary: Right: Resting right ankle-brachial index indicates mild right lower extremity arterial disease. Abnormal waveform of right 2nd toe. Left: Resting left ankle-brachial index is within normal range. The left toe-brachial index is normal. *See table(s) above for measurements and observations.  Electronically signed by Gerarda Fraction on 12/08/2023 at 7:01:53 PM.    Final    CT Angio Aortobifemoral W and/or Wo Contrast Result Date: 12/07/2023 CLINICAL DATA:  Right foot pain with discoloration and swelling of great toe, diabetes EXAM: CT ANGIOGRAPHY OF ABDOMINAL AORTA WITH ILIOFEMORAL RUNOFF TECHNIQUE: Multidetector CT imaging of the abdomen, pelvis and lower extremities was performed using the standard protocol during bolus administration of intravenous contrast. Multiplanar CT image reconstructions and MIPs were obtained to evaluate the vascular anatomy. RADIATION  DOSE REDUCTION: This exam was performed according to the departmental dose-optimization program which includes automated exposure control, adjustment of the mA and/or kV according to patient size and/or use of iterative reconstruction technique. CONTRAST:  OMNIPAQUE IOHEXOL 350 MG/ML SOLN COMPARISON:  None Available. FINDINGS: VASCULAR Aorta: Normal caliber aorta without aneurysm, dissection, vasculitis or significant stenosis. Marked atherosclerosis of the infrarenal aorta. Celiac:  Patent without evidence of aneurysm, dissection, vasculitis or significant stenosis. SMA: Patent without evidence of aneurysm, dissection, vasculitis or significant stenosis. 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 There is evidence of high-grade ostial stenosis of the inferior renal arteries bilaterally, estimated greater than 70% stenosis. The more superior bilateral renal arteries are widely patent. No evidence of aneurysm, dissection, or fibromuscular dysplasia. IMA: Patent without evidence of aneurysm, dissection, vasculitis or significant stenosis. 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 Diffuse atherosclerosis. No aneurysm, dissection, vasculitis, or high-grade stenosis. Outflow: Right common femoral artery is widely patent. There is focal high-grade stenosis of the mid right SFA, with greater than 70% stenosis. Right popliteal artery is widely patent. No evidence of aneurysm, dissection, or vasculitis. Runoff: Patent three vessel runoff to the ankle. LEFT Lower Extremity Inflow: Common, internal and external iliac arteries are patent without evidence of aneurysm, dissection, vasculitis or significant stenosis. Mild diffuse atherosclerosis. Outflow: Common, superficial and profunda femoral arteries and the popliteal artery are patent without evidence of aneurysm, dissection, vasculitis or significant stenosis. Runoff: Patent three vessel runoff to the ankle. Veins: No obvious venous abnormality within the limitations of this arterial phase study. Review of the MIP images confirms the above findings. NON-VASCULAR Lower chest: No acute pleural or parenchymal lung disease. Hepatobiliary: No focal liver abnormality is seen. No gallstones, gallbladder wall thickening, or biliary dilatation. Pancreas: Unremarkable. No pancreatic ductal dilatation or surrounding inflammatory changes.  Spleen: Normal in size without focal abnormality. Adrenals/Urinary Tract: Adrenal glands are unremarkable. Kidneys are normal, without renal calculi, focal lesion, or hydronephrosis. Bladder is unremarkable. Stomach/Bowel: No bowel obstruction or ileus. The appendix is surgically absent. No bowel wall thickening or inflammatory change. Lymphatic: No pathologic adenopathy within the abdomen or pelvis. Reproductive: Prostate is unremarkable. Other: No free fluid or free intraperitoneal gas. No abdominal wall hernia. Musculoskeletal: No acute or destructive bony abnormalities. Reconstructed images demonstrate no additional findings. IMPRESSION: VASCULAR 1. 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 No evidence of high-grade stenosis. 2. 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 The more superior main renal arteries are widely patent bilaterally. 3. 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 4.  Aortic Atherosclerosis (ICD10-I70.0). NON-VASCULAR 1. No acute intra-abdominal or intrapelvic process. Electronically Signed   By: Sharlet Salina M.D.   On: 12/07/2023 16:34   DG Foot Complete Right Result Date: 12/07/2023 CLINICAL DATA:  Pain and swelling in the great toe with discoloration. No known injury. History of diabetes. EXAM: RIGHT FOOT COMPLETE - 3+ VIEW COMPARISON:  None Available. FINDINGS: The mineralization and alignment are normal. There is no evidence of acute fracture or dislocation. The joint spaces are preserved. No erosive changes are identified. There is some soft tissue swelling medially in the forefoot without evidence of soft tissue calcification, foreign body or soft tissue emphysema. IMPRESSION: Nonspecific mild medial forefoot soft tissue swelling. No acute  osseous findings or significant  arthropathic changes. Electronically Signed   By: Carey Bullocks M.D.   On: 12/07/2023 14:49     Time coordinating discharge: Over 30 minutes    Lewie Chamber, MD  [facility] 12/09/2023, 3:38 PM

## 2023-12-09 NOTE — Progress Notes (Signed)
 PHARMACIST LIPID MONITORING   Mark Phillips is a 53 y.o. male admitted on 12/07/2023 with R great toe pain s/p abdominal aortogram and RLE angiogram with stents to right SFA and R common iliac artery.  Pharmacy has been consulted to optimize lipid-lowering therapy with the indication of secondary prevention for clinical ASCVD.  Recent Labs:  Lipid Panel (last 6 months):   Lab Results  Component Value Date   CHOL 174 12/08/2023   TRIG 159 (H) 12/08/2023   HDL 22 (L) 12/08/2023   CHOLHDL 7.9 12/08/2023   VLDL 32 12/08/2023   LDLCALC 120 (H) 12/08/2023    Hepatic function panel (last 6 months):   No results found for: "AST", "ALT", "ALKPHOS", "BILITOT", "BILIDIR", "IBILI"  SCr (since admission):   Serum creatinine: 0.72 mg/dL 78/29/56 2130 Estimated creatinine clearance: 114.3 mL/min  Current therapy and lipid therapy tolerance Current lipid-lowering therapy: none Previous lipid-lowering therapies (if applicable): atorvastatin Documented or reported allergies or intolerances to lipid-lowering therapies (if applicable): none  Assessment:   Patient started on high intensity lipid therapy this admission for PAD.  Plan:    1.Statin intensity (high intensity recommended for all patients regardless of the LDL):  No statin changes. The patient is already on a high intensity statin.  2.Add ezetimibe (if any one of the following):   Not indicated at this time.  3.Refer to lipid clinic:   No  4.Follow-up with:  Primary care provider - Pcp, No  5.Follow-up labs after discharge:  Changes in lipid therapy were made. Check a lipid panel in 8-12 weeks then annually.      Thank you for involving pharmacy in this patient's care.  Loura Back, PharmD, BCPS Clinical Pharmacist Clinical phone for 12/09/2023 is [phone] 12/09/2023 3:08 PM

## 2023-12-09 NOTE — Plan of Care (Signed)
 progressing

## 2023-12-09 NOTE — Progress Notes (Signed)
 Patient discharged at shift change. D/C paperwork gone over with patient by day shift rn,. Ivs out and no needs at this time. Follow up scheduled Friday per patient.

## 2023-12-09 NOTE — Progress Notes (Signed)
      NPO past MN Plan for angiogram with possible intervention right GT ischemia with skin changes He agrees to proceed  Genteel hydration and Heparin have been started. He has been started on ASA with daily Statin for medical therapy. Pending interventions he may need to be on Dual anti platelets.   Mosetta Pigeon PA-C

## 2023-12-09 NOTE — Plan of Care (Signed)

## 2023-12-09 NOTE — Plan of Care (Signed)
  Problem: Education: Goal: Knowledge of General Education information will improve Description: Including pain rating scale, medication(s)/side effects and non-pharmacologic comfort measures Outcome: Adequate for Discharge   Problem: Health Behavior/Discharge Planning: Goal: Ability to manage health-related needs will improve Outcome: Adequate for Discharge   Problem: Clinical Measurements: Goal: Ability to maintain clinical measurements within normal limits will improve Outcome: Adequate for Discharge Goal: Will remain free from infection Outcome: Adequate for Discharge Goal: Diagnostic test results will improve Outcome: Adequate for Discharge Goal: Respiratory complications will improve Outcome: Adequate for Discharge Goal: Cardiovascular complication will be avoided Outcome: Adequate for Discharge   Problem: Activity: Goal: Risk for activity intolerance will decrease Outcome: Adequate for Discharge   Problem: Nutrition: Goal: Adequate nutrition will be maintained Outcome: Adequate for Discharge   Problem: Coping: Goal: Level of anxiety will decrease Outcome: Adequate for Discharge   Problem: Elimination: Goal: Will not experience complications related to bowel motility Outcome: Adequate for Discharge Goal: Will not experience complications related to urinary retention Outcome: Adequate for Discharge   Problem: Pain Managment: Goal: General experience of comfort will improve and/or be controlled Description: Pain meds Outcome: Adequate for Discharge   Problem: Safety: Goal: Ability to remain free from injury will improve Outcome: Adequate for Discharge   Problem: Skin Integrity: Goal: Risk for impaired skin integrity will decrease Outcome: Adequate for Discharge   Problem: Education: Goal: Ability to describe self-care measures that may prevent or decrease complications (Diabetes Survival Skills Education) will improve Outcome: Adequate for Discharge Goal:  Individualized Educational Video(s) Outcome: Adequate for Discharge   Problem: Coping: Goal: Ability to adjust to condition or change in health will improve Outcome: Adequate for Discharge   Problem: Fluid Volume: Goal: Ability to maintain a balanced intake and output will improve Outcome: Adequate for Discharge   Problem: Health Behavior/Discharge Planning: Goal: Ability to identify and utilize available resources and services will improve Outcome: Adequate for Discharge Goal: Ability to manage health-related needs will improve Outcome: Adequate for Discharge   Problem: Metabolic: Goal: Ability to maintain appropriate glucose levels will improve Outcome: Adequate for Discharge   Problem: Nutritional: Goal: Maintenance of adequate nutrition will improve Outcome: Adequate for Discharge Goal: Progress toward achieving an optimal weight will improve Outcome: Adequate for Discharge   Problem: Skin Integrity: Goal: Risk for impaired skin integrity will decrease Outcome: Adequate for Discharge   Problem: Tissue Perfusion: Goal: Adequacy of tissue perfusion will improve Outcome: Adequate for Discharge   Problem: Education: Goal: Understanding of CV disease, CV risk reduction, and recovery process will improve Outcome: Adequate for Discharge Goal: Individualized Educational Video(s) Outcome: Adequate for Discharge   Problem: Activity: Goal: Ability to return to baseline activity level will improve Outcome: Adequate for Discharge   Problem: Cardiovascular: Goal: Ability to achieve and maintain adequate cardiovascular perfusion will improve Outcome: Adequate for Discharge Goal: Vascular access site(s) Level 0-1 will be maintained Outcome: Adequate for Discharge   Problem: Health Behavior/Discharge Planning: Goal: Ability to safely manage health-related needs after discharge will improve Outcome: Adequate for Discharge

## 2023-12-09 NOTE — TOC CM/SW Note (Signed)
 Transition of Care [facility]) - Inpatient Brief Assessment   Patient Details  Name: Mark Phillips MRN: [id number] Date of Birth: [birth year]  Transition of Care [facility]) CM/SW Contact:    Gala Lewandowsky, RN Phone Number: 12/09/2023, 4:23 PM   Clinical Narrative: Patient presented for PAD-post angiogram. PTA patient was from home with support of spouse. Patient is currently without PCP and insurance. [facility] is following for possible Medicaid. Patient is agreeable to hospital follow up with the [facility] Department. Information placed on the AVS.  MATCH for medication assistance completed. Plan for transition home this evening. No further needs identified at this time.    Transition of Care Asessment: Insurance and Status: Insurance coverage has been reviewed Patient has primary care physician: No (PCP appointment scheduled for [facility]) Home environment has been reviewed: reviewed Prior level of function:: independent Prior/Current Home Services: No current home services Social Drivers of Health Review: SDOH reviewed no interventions necessary Readmission risk has been reviewed: Yes Transition of care needs: no transition of care needs at this time

## 2023-12-09 NOTE — Progress Notes (Signed)
 PHARMACY - ANTICOAGULATION CONSULT NOTE  Pharmacy Consult for heparin Indication:  ischemic toe  Labs: Recent Labs    12/07/23 1425 12/07/23 1521 12/07/23 1650 12/08/23 0046 12/08/23 0706 12/08/23 0838 12/08/23 1801 12/09/23 0658  HGB 14.1  --   --   --  12.2*  --   --  12.1*  HCT 41.6  --   --   --  35.6*  --   --  35.2*  PLT 199  --   --   --  165  --   --  160  APTT  --  24  --   --   --   --   --   --   LABPROT  --  12.6  --   --   --   --   --   --   INR  --  0.9  --   --   --   --   --   --   HEPARINUNFRC  --   --   --    < >  --  0.18* 0.26* 0.54  CREATININE 0.75  --   --   --  0.66  --   --   --   TROPONINIHS  --  30* 32*  --   --   --   --   --    < > = values in this interval not displayed.   Assessment: 53yo male subtherapeutic on heparin with initial dosing for ischemic toe; no infusion issues or signs of bleeding per RN.  Heparin level now therapeutic   Goal of Therapy:  Heparin level 0.3-0.7 units/ml   Plan:  Continue heparin infusion at 2000 units/hr. Follow up after vascular procedure  Thank you Okey Regal, PharmD 12/09/2023 8:10 AM

## 2023-12-09 NOTE — Interval H&P Note (Signed)
 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  The various methods of treatment have been discussed with the patient and family. After consideration of risks, benefits and other options for treatment, the patient has consented to  Procedure(s): ABDOMINAL AORTOGRAM W/LOWER EXTREMITY (N/A) as a surgical intervention.  The patient's history has been reviewed, patient examined, no change in status, stable for surgery.  I have reviewed the patient's chart and labs.  Questions were answered to the patient's satisfaction.     Durene Cal

## 2023-12-09 NOTE — Op Note (Signed)
 Patient name: Mark Phillips MRN: [id number] DOB: 1971/06/12 Sex: male  12/09/2023 Pre-operative Diagnosis: Right blue toe syndrome Post-operative diagnosis:  Same Surgeon:  Durene Cal Procedure Performed:  1.  Ultrasound-guided access, left femoral artery  2.  Abdominal aortogram  3.  Right lower extremity angiogram  4.  Catheter selection right external iliac artery  5.  Stent, right superficial femoral artery (7 x 60 Eluvia)  6.  Stent, right common iliac artery (9 x 39 Eluvia  7.  Conscious sedation, 49 minutes  8.  Closure device, Celt   Indications: This is a 53 year old gentleman who presented to the hospital with a painful right blue toe.  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  He is here for further evaluation of possible intervention  Procedure:  The patient was identified in the holding area and taken to room 8.  The patient was then placed supine on the table and prepped and draped in the usual sterile fashion.  A time out was called.  Conscious sedation was administered with the use of IV fentanyl and Versed under continuous physician and nurse monitoring.  Heart rate, blood pressure, and oxygen saturation were continuously monitored.  Total sedation time was 49 minutes.  Ultrasound was used to evaluate the left common femoral artery.  It was patent .  A digital ultrasound image was acquired.  A micropuncture needle was used to access the left common femoral artery under ultrasound guidance.  An 018 wire was advanced without resistance and a micropuncture sheath was placed.  The 018 wire was removed and a benson wire was placed.  The micropuncture sheath was exchanged for a 5 french sheath.  An omniflush catheter was advanced over the wire to the level of L-1.  An abdominal angiogram was obtained.  Next, using the omniflush catheter and a benson wire, the aortic bifurcation was crossed and the catheter was placed into theright external iliac artery and  right runoff was obtained.  Findings:   Aortogram: No significant renal artery stenosis was visualized.  The infrarenal abdominal aorta is widely patent.  The left common and external iliac arteries are widely patent.  Luminal valleculae artery with at least 50% stenosis is visualized in the right common iliac artery.  The extrailiac artery is widely patent  Right Lower Extremity: The right common femoral and profundofemoral artery are widely patent.  The superficial femoral artery is patent however in the middle third of the SFA there is a short segment approximately 3 cm lesion with 80% stenosis.  The remaining superficial femoral-popliteal artery are mildly diseased.  There is three-vessel runoff.  There is early disease out onto the foot  Left Lower Extremity: Not evaluated  Intervention: After the above images were acquired the decision was made to proceed with intervention.  A 745 sheath was advanced into the right external iliac artery and the patient was fully heparinized.  I crossed the lesion in the superficial femoral artery with a Glidewire advantage.  I elected to primarily stent this.  A 7 x 60 Eluvia stent was deployed across the lesion which was postdilated with a 6 mm balloon.  Completion imaging showed complete resolution of the stenosis and no change in runoff.  Attention was then turned towards the iliac lesion.  I deployed a 9 x 39 VBX stent across this lesion with no residual stenosis.  The groin was closed with a Celt  Impression:  #1  Short segment greater than 80%  stenosis within the right superficial femoral artery successfully treated using a 7 mm Eluvia stent  #2  Focal luminal irregularity in the right common iliac artery treated using a VBX 9 x 39.  This was the area of concern for dissection on CT scan  #3  Patient will be started on aspirin and Plavix.  He will not need heparin or anticoagulation    V. Durene Cal, M.D., [facility]  [place] Office: [phone] Pager:  [phone]

## 2023-12-13 LAB — CULTURE, BLOOD (ROUTINE X 2)
Culture: NO GROWTH
Culture: NO GROWTH

## 2023-12-19 ENCOUNTER — Emergency Department (HOSPITAL_COMMUNITY): Payer: Self-pay

## 2023-12-19 ENCOUNTER — Encounter (HOSPITAL_COMMUNITY): Payer: Self-pay | Admitting: *Deleted

## 2023-12-19 ENCOUNTER — Other Ambulatory Visit: Payer: Self-pay

## 2023-12-19 ENCOUNTER — Emergency Department (HOSPITAL_COMMUNITY)
Admission: EM | Admit: 2023-12-19 | Discharge: 2023-12-19 | Disposition: A | Discharge status: Home / Self Care | Payer: Self-pay | Attending: Emergency Medicine | Admitting: Emergency Medicine

## 2023-12-19 DIAGNOSIS — Z7982 Long term (current) use of aspirin: Secondary | ICD-10-CM | POA: Insufficient documentation

## 2023-12-19 DIAGNOSIS — I75021 Atheroembolism of right lower extremity: Secondary | ICD-10-CM

## 2023-12-19 DIAGNOSIS — I739 Peripheral vascular disease, unspecified: Secondary | ICD-10-CM | POA: Insufficient documentation

## 2023-12-19 DIAGNOSIS — I10 Essential (primary) hypertension: Secondary | ICD-10-CM | POA: Insufficient documentation

## 2023-12-19 DIAGNOSIS — Z7984 Long term (current) use of oral hypoglycemic drugs: Secondary | ICD-10-CM | POA: Insufficient documentation

## 2023-12-19 DIAGNOSIS — E119 Type 2 diabetes mellitus without complications: Secondary | ICD-10-CM | POA: Insufficient documentation

## 2023-12-19 LAB — CBC WITH DIFFERENTIAL/PLATELET
Abs Immature Granulocytes: 0.06 10*3/uL (ref 0.00–0.07)
Basophils Absolute: 0.1 10*3/uL (ref 0.0–0.1)
Basophils Relative: 1 %
Eosinophils Absolute: 0.8 10*3/uL — ABNORMAL HIGH (ref 0.0–0.5)
Eosinophils Relative: 6 %
HCT: 39.1 % (ref 39.0–52.0)
Hemoglobin: 13.2 g/dL (ref 13.0–17.0)
Immature Granulocytes: 0 %
Lymphocytes Relative: 27 %
Lymphs Abs: 3.8 10*3/uL (ref 0.7–4.0)
MCH: 29.4 pg (ref 26.0–34.0)
MCHC: 33.8 g/dL (ref 30.0–36.0)
MCV: 87.1 fL (ref 80.0–100.0)
Monocytes Absolute: 0.9 10*3/uL (ref 0.1–1.0)
Monocytes Relative: 7 %
Neutro Abs: 8.2 10*3/uL — ABNORMAL HIGH (ref 1.7–7.7)
Neutrophils Relative %: 59 %
Platelets: 378 10*3/uL (ref 150–400)
RBC: 4.49 MIL/uL (ref 4.22–5.81)
RDW: 13 % (ref 11.5–15.5)
WBC: 13.9 10*3/uL — ABNORMAL HIGH (ref 4.0–10.5)
nRBC: 0 % (ref 0.0–0.2)

## 2023-12-19 LAB — COMPREHENSIVE METABOLIC PANEL
ALT: 13 U/L (ref 0–44)
AST: 12 U/L — ABNORMAL LOW (ref 15–41)
Albumin: 3.3 g/dL — ABNORMAL LOW (ref 3.5–5.0)
Alkaline Phosphatase: 57 U/L (ref 38–126)
Anion gap: 11 (ref 5–15)
BUN: 6 mg/dL (ref 6–20)
CO2: 26 mmol/L (ref 22–32)
Calcium: 9.1 mg/dL (ref 8.9–10.3)
Chloride: 101 mmol/L (ref 98–111)
Creatinine, Ser: 0.64 mg/dL (ref 0.61–1.24)
GFR, Estimated: >60 mL/min (ref >60)
Glucose, Bld: 216 mg/dL — ABNORMAL HIGH (ref 70–99)
Potassium: 3.4 mmol/L — ABNORMAL LOW (ref 3.5–5.1)
Sodium: 138 mmol/L (ref 135–145)
Total Bilirubin: 0.3 mg/dL (ref 0.0–1.2)
Total Protein: 7.7 g/dL (ref 6.5–8.1)

## 2023-12-19 MED ORDER — IOHEXOL 350 MG/ML SOLN
125.0000 mL | Freq: Once | INTRAVENOUS | Status: AC | PRN
  Administered 2023-12-19: 125 mL via INTRAVENOUS

## 2023-12-19 NOTE — ED Triage Notes (Addendum)
 Pt reports he had right foot pain on 3/2 and came to the ER and was sent to [facility] and had 2 stents placed in his leg. Pt followed up with his doctor today and they could not find a pulse in the foot, his great toe was turning blacker than it had been, and purple discoloration was now showing on the lateral aspect of his right foot so he was told to come to the ED. Pulses not palpable in right foot, but found with doppler.

## 2023-12-19 NOTE — ED Provider Notes (Signed)
 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  His great toe has been insensate ever since the procedure.  On exam the patient does have a black appearing partial toe of the great toe, the small toe does have a purpleish hue, he has decreased sensation over the small toe and no sensation over the great toe.  Otherwise the foot is warm and perfused with good capillary refill to the middle 3 toes and the dorsalis pedis.  Good range of motion of the ankle.  No tachycardia.  Will discuss with vascular after CT scan has been interpreted by radiology.  The patient has been compliant with his aspirin and Plavix      I did discuss the case with Dr. Criss Alvine of the vascular surgery service who is viewed the pictures and agrees that the patient can follow-up on Monday with Dr. Myra Gianotti, at this time the patient is hemodynamically stable, the CT scan is reassuring, he is afebrile.  Wound care recommended with clean sterile dressings, patient agreeable   Eber Hong, MD 12/19/23 [phone]

## 2023-12-19 NOTE — ED Notes (Signed)
 Able to feel pulse to right foot and is moderate. Can feel some touch. Able to wiggle toes.

## 2023-12-19 NOTE — Discharge Instructions (Signed)
 I have spoken with the vascular surgeon and they want you to follow-up on Monday with Dr. Myra Gianotti, please see the phone number above, keep this clean and dry, return to the ER for severe worsening pain swelling redness pus or fever

## 2023-12-19 NOTE — ED Provider Notes (Signed)
 [facility]PARTMENT AT [facility] Provider Note   CSN: [id number] Arrival date & time: 12/19/23  1023     History  Chief Complaint  Patient presents with   Circulatory Problem    Mark Phillips is a 53 y.o. male.  Patient with hypertension and diabetes and severe peripheral vascular disease.  He had stent placed for that right foot approximately 2 weeks ago.  Patient states that his right great toe has become black and he is starting to have discoloration to the fifth toe and lateral part of his foot.  This has been going on for couple days  The history is provided by the patient and medical records. No language interpreter was used.  Foot Pain This is a recurrent problem. The current episode started 2 days ago. The problem occurs constantly. The problem has been gradually worsening. Pertinent negatives include no chest pain, no abdominal pain and no headaches. Nothing aggravates the symptoms. Nothing relieves the symptoms.       Home Medications Prior to Admission medications   Medication Sig Start Date End Date Taking? Authorizing Provider  aspirin EC 81 MG tablet Take 1 tablet (81 mg total) by mouth daily. Swallow whole. 12/09/23   Lewie Chamber, MD  atorvastatin (LIPITOR) 40 MG tablet Take 1 tablet (40 mg total) by mouth daily. 12/10/23   Lewie Chamber, MD  clopidogrel (PLAVIX) 75 MG tablet Take 1 tablet (75 mg total) by mouth daily with breakfast. 12/10/23   Lewie Chamber, MD  ibuprofen (ADVIL) 200 MG tablet Take 800 mg by mouth 2 (two) times daily as needed for headache or moderate pain (pain score 4-6).    [provider]  metFORMIN (GLUCOPHAGE) 1000 MG tablet TAKE 1 Tablet  BY MOUTH TWICE DAILY WITH A MEAL Patient taking differently: Take 1,000 mg by mouth 2 (two) times daily as needed (hyperglycemia). 01/06/19   Jacquelin Hawking, PA-C  oxyCODONE (OXY IR/ROXICODONE) 5 MG immediate release tablet Take 1 tablet (5 mg total) by mouth every 6  (six) hours as needed for moderate pain (pain score 4-6) or severe pain (pain score 7-10). 12/09/23   Lewie Chamber, MD      Allergies    Ventolin [albuterol]    Review of Systems   Review of Systems  Constitutional:  Negative for appetite change and fatigue.  HENT:  Negative for congestion, ear discharge and sinus pressure.   Eyes:  Negative for discharge.  Respiratory:  Negative for cough.   Cardiovascular:  Negative for chest pain.  Gastrointestinal:  Negative for abdominal pain and diarrhea.  Genitourinary:  Negative for frequency and hematuria.  Musculoskeletal:  Negative for back pain.  Skin:  Positive for rash.  Neurological:  Negative for seizures and headaches.  Psychiatric/Behavioral:  Negative for hallucinations.     Physical Exam Updated Vital Signs BP (!) 180/100 (BP Location: Left Arm)   Pulse 75   Temp 97.9 F (36.6 C) (Oral)   Resp 16   Ht 5\' 11"  (1.803 m)   Wt 74.8 kg   SpO2 99%   BMI 23.01 kg/m  Physical Exam Vitals and nursing note reviewed.  Constitutional:      Appearance: He is well-developed.  HENT:     Head: Normocephalic.     Nose: Nose normal.  Eyes:     General: No scleral icterus.    Conjunctiva/sclera: Conjunctivae normal.  Neck:     Thyroid: No thyromegaly.  Cardiovascular:     Rate and Rhythm: Normal  rate and regular rhythm.     Heart sounds: No murmur heard.    No friction rub. No gallop.  Pulmonary:     Breath sounds: No stridor. No wheezing or rales.  Chest:     Chest wall: No tenderness.  Abdominal:     General: There is no distension.     Tenderness: There is no abdominal tenderness. There is no rebound.  Musculoskeletal:        General: Normal range of motion.     Cervical back: Neck supple.     Comments: Dorsalis pedis pulse 2+.  Right large toe is black with decreased sensation.  Right fifth toe has purple discoloration and now has significant amount of the lateral right foot that has purple discoloration.   Lymphadenopathy:     Cervical: No cervical adenopathy.  Skin:    Findings: No erythema or rash.  Neurological:     Mental Status: He is alert and oriented to person, place, and time.     Motor: No abnormal muscle tone.     Coordination: Coordination normal.  Psychiatric:        Behavior: Behavior normal.     ED Results / Procedures / Treatments   Labs (all labs ordered are listed, but only abnormal results are displayed) Labs Reviewed  CBC WITH DIFFERENTIAL/PLATELET - Abnormal; Notable for the following components:      Result Value   WBC 13.9 (*)    Neutro Abs 8.2 (*)    Eosinophils Absolute 0.8 (*)    All other components within normal limits  COMPREHENSIVE METABOLIC PANEL - Abnormal; Notable for the following components:   Potassium 3.4 (*)    Glucose, Bld 216 (*)    Albumin 3.3 (*)    AST 12 (*)    All other components within normal limits    EKG None  Radiology CT Angio Aortobifemoral W and/or Wo Contrast Result Date: 12/19/2023 CLINICAL DATA:  53 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 Multiplanar CT image reconstructions and MIPs were obtained to evaluate the vascular anatomy. RADIATION DOSE REDUCTION: This exam was performed according to the departmental dose-optimization program which includes automated exposure control, adjustment of the mA and/or kV according to patient size and/or use of iterative reconstruction technique. CONTRAST:  OMNIPAQUE IOHEXOL 350 MG/ML SOLN COMPARISON:  12/07/2023 FINDINGS: VASCULAR Aorta: Mild to moderate atherosclerotic changes of the abdominal aorta. No ulcerated plaque. There is irregular atheromatous plaque projecting into the lumen of the aorta just below the  IMA origin, unchanged from the comparison. This does not have a particularly pedunculated appearance and has not enlarged since the comparison. No dissection or periaortic fluid/inflammation. Celiac: Patent, with no significant atherosclerotic changes. SMA: Patent, with no significant atherosclerotic changes. Renals: - Right: Main right renal artery without significant atherosclerosis. Small caliber accessory renal artery with stenosis at the origin to the lower pole segment. - Left: Main left renal artery patent without significant atherosclerosis. Pre hilar branch to the upper pole segment. Accessory left renal artery demonstrates some plaque at the origin, with likely stenosis to the lower pole segment. IMA: Inferior mesenteric artery is patent. Right lower extremity: Interval right common iliac artery stent which is patent. No evidence of in stent stenosis or thrombus formation. Hypogastric artery patent with pelvic  arteries patent. External iliac artery patent. Common femoral artery patent without significant atherosclerotic changes. Profunda femoris and thigh branches patent. Length of the superficial femoral artery is patent. There is interval stent within the mid segment of the SFA. No evidence of in stent stenosis or thrombus. Minimal atherosclerotic changes proximally. Popliteal artery patent with similar 50% or less narrowing at the level of the knee joint. Typical trifurcation anatomy. Anterior tibial artery is patent from the origin to the dorsalis pedis without significant atherosclerosis. Tibioperoneal trunk is patent with mild atherosclerosis. Posterior tibial artery is patent from the origin to the plantar artery and into the foot. Peroneal artery is patent from the origin to the ankle. Left lower extremity: Mild left-sided iliac arterial disease. No high-grade stenosis or occlusion of the common iliac artery or the external iliac artery. Hypogastric artery is patent with soft plaque at the origin  estimated 50% narrowing. Pelvic arteries are patent. Common femoral artery with with post intervention changes of metallic closure device. Mild atherosclerosis. Profunda femoris and the thigh branches are patent. The length of the superficial femoral artery is patent. Mild atherosclerosis without high-grade stenosis. Popliteal artery is patent. Typical trifurcation anatomy. Anterior tibial artery is patent from the origin to the dorsalis pedis. Tibioperoneal trunk is patent with mild atherosclerosis. Posterior tibial artery is patent from the origin to the plantar arteries. Peroneal artery is patent from the origin to the ankle. Veins: Unremarkable appearance of the venous system. Review of the MIP images confirms the above findings. NON-VASCULAR Lower chest: No acute. Hepatobiliary: Diffusely decreased attenuation of liver parenchyma suggesting steatosis. Unremarkable gall bladder. Pancreas: Unremarkable. Spleen: Unremarkable. Adrenals/Urinary Tract: - Right adrenal gland: Unremarkable - Left adrenal gland: Unremarkable. - Right kidney: No hydronephrosis, nephrolithiasis, inflammation, or ureteral dilation. Regional cortical thinning in the lower pole segment corresponding to the stenotic accessory renal artery perfusing this territory. - Left Kidney: No hydronephrosis, nephrolithiasis, inflammation, or ureteral dilation. No focal lesion. - Urinary Bladder: Unremarkable. Stomach/Bowel: - Stomach: Unremarkable. - Small bowel: Unremarkable - Appendix: Presumably surgically absent - Colon: Surgical changes of right colon, similar to the comparison Lymphatic: Small lymph nodes in the right greater than left inguinal regions, presumably reactive. No abdominal or mesenteric adenopathy. Mesenteric: No free fluid or air. No mesenteric adenopathy. Reproductive: Unremarkable prostate. Other: No hernia. Musculoskeletal: No evidence of acute fracture. No bony canal narrowing. No significant degenerative changes of the hips.  IMPRESSION: Negative for acute arterial abnormality. Interval stenting of the right CIA and the right SFA without complicating features. Similar appearance of mild to moderate irregular soft plaque of the abdominal aorta as above. Aortic Atherosclerosis (ICD10-I70.0). Additional changes of peripheral arterial disease include: -mild bilateral iliac arterial disease without high-grade stenosis or occlusion -mild bilateral femoropopliteal disease without high-grade stenosis or occlusion -minimal tibial disease, with 3 vessel runoff to the ankles maintained Renal arterial disease, including high-grade stenosis of the bilateral accessory renal arteries unchanged from prior. Additional ancillary findings as above. Signed, Yvone Neu. Miachel Roux, RPVI [facility] Electronically Signed   By: Gilmer Mor D.O.   On: 12/19/2023 15:26    Procedures Procedures    Medications Ordered in ED Medications  iohexol (OMNIPAQUE) 350 MG/ML injection 125 mL (125 mLs Intravenous Contrast Given 12/19/23 1307)    ED Course/ Medical Decision Making/ A&P  Medical Decision Making Amount and/or Complexity of Data Reviewed Labs: ordered. Radiology: ordered.  Risk Prescription drug management.   Patient with worsening of peripheral vascular disease on his right foot.  CT angio of the right distal extremity is pending and Dr. Hyacinth Meeker will disposition the patient        Final Clinical Impression(s) / ED Diagnoses Final diagnoses:  None    Rx / DC Orders ED Discharge Orders     None         Bethann Berkshire, MD 12/20/23 [phone]

## 2023-12-22 ENCOUNTER — Encounter (HOSPITAL_COMMUNITY): Payer: Self-pay

## 2023-12-22 ENCOUNTER — Emergency Department (HOSPITAL_COMMUNITY): Payer: Self-pay

## 2023-12-22 ENCOUNTER — Inpatient Hospital Stay (HOSPITAL_COMMUNITY)
Admission: EM | Admit: 2023-12-22 | Discharge: 2023-12-26 | DRG: 256 | Disposition: A | Discharge status: Home / Self Care | Payer: Self-pay | Attending: Internal Medicine | Admitting: Internal Medicine

## 2023-12-22 ENCOUNTER — Other Ambulatory Visit: Payer: Self-pay

## 2023-12-22 DIAGNOSIS — L03031 Cellulitis of right toe: Secondary | ICD-10-CM | POA: Diagnosis present

## 2023-12-22 DIAGNOSIS — L97511 Non-pressure chronic ulcer of other part of right foot limited to breakdown of skin: Principal | ICD-10-CM

## 2023-12-22 DIAGNOSIS — Z720 Tobacco use: Secondary | ICD-10-CM | POA: Diagnosis present

## 2023-12-22 DIAGNOSIS — Z833 Family history of diabetes mellitus: Secondary | ICD-10-CM

## 2023-12-22 DIAGNOSIS — Z7984 Long term (current) use of oral hypoglycemic drugs: Secondary | ICD-10-CM

## 2023-12-22 DIAGNOSIS — I739 Peripheral vascular disease, unspecified: Secondary | ICD-10-CM | POA: Diagnosis present

## 2023-12-22 DIAGNOSIS — I96 Gangrene, not elsewhere classified: Secondary | ICD-10-CM | POA: Diagnosis present

## 2023-12-22 DIAGNOSIS — E118 Type 2 diabetes mellitus with unspecified complications: Secondary | ICD-10-CM | POA: Diagnosis present

## 2023-12-22 DIAGNOSIS — E1152 Type 2 diabetes mellitus with diabetic peripheral angiopathy with gangrene: Principal | ICD-10-CM | POA: Diagnosis present

## 2023-12-22 DIAGNOSIS — Z95820 Peripheral vascular angioplasty status with implants and grafts: Secondary | ICD-10-CM

## 2023-12-22 DIAGNOSIS — Z79899 Other long term (current) drug therapy: Secondary | ICD-10-CM

## 2023-12-22 DIAGNOSIS — B9561 Methicillin susceptible Staphylococcus aureus infection as the cause of diseases classified elsewhere: Secondary | ICD-10-CM | POA: Diagnosis present

## 2023-12-22 DIAGNOSIS — K219 Gastro-esophageal reflux disease without esophagitis: Secondary | ICD-10-CM | POA: Diagnosis present

## 2023-12-22 DIAGNOSIS — Z7902 Long term (current) use of antithrombotics/antiplatelets: Secondary | ICD-10-CM

## 2023-12-22 DIAGNOSIS — L98491 Non-pressure chronic ulcer of skin of other sites limited to breakdown of skin: Secondary | ICD-10-CM | POA: Diagnosis present

## 2023-12-22 DIAGNOSIS — Z87442 Personal history of urinary calculi: Secondary | ICD-10-CM

## 2023-12-22 DIAGNOSIS — Z8249 Family history of ischemic heart disease and other diseases of the circulatory system: Secondary | ICD-10-CM

## 2023-12-22 DIAGNOSIS — M86171 Other acute osteomyelitis, right ankle and foot: Secondary | ICD-10-CM | POA: Diagnosis present

## 2023-12-22 DIAGNOSIS — Z7982 Long term (current) use of aspirin: Secondary | ICD-10-CM

## 2023-12-22 DIAGNOSIS — I152 Hypertension secondary to endocrine disorders: Secondary | ICD-10-CM | POA: Diagnosis present

## 2023-12-22 DIAGNOSIS — E876 Hypokalemia: Secondary | ICD-10-CM | POA: Diagnosis present

## 2023-12-22 DIAGNOSIS — F17210 Nicotine dependence, cigarettes, uncomplicated: Secondary | ICD-10-CM | POA: Diagnosis present

## 2023-12-22 DIAGNOSIS — E1169 Type 2 diabetes mellitus with other specified complication: Secondary | ICD-10-CM | POA: Diagnosis present

## 2023-12-22 DIAGNOSIS — E785 Hyperlipidemia, unspecified: Secondary | ICD-10-CM | POA: Diagnosis present

## 2023-12-22 DIAGNOSIS — Z716 Tobacco abuse counseling: Secondary | ICD-10-CM

## 2023-12-22 DIAGNOSIS — E1159 Type 2 diabetes mellitus with other circulatory complications: Secondary | ICD-10-CM | POA: Diagnosis present

## 2023-12-22 DIAGNOSIS — E1165 Type 2 diabetes mellitus with hyperglycemia: Secondary | ICD-10-CM | POA: Diagnosis present

## 2023-12-22 DIAGNOSIS — Z888 Allergy status to other drugs, medicaments and biological substances status: Secondary | ICD-10-CM

## 2023-12-22 DIAGNOSIS — Z825 Family history of asthma and other chronic lower respiratory diseases: Secondary | ICD-10-CM

## 2023-12-22 LAB — BASIC METABOLIC PANEL
Anion gap: 10 (ref 5–15)
BUN: 7 mg/dL (ref 6–20)
CO2: 25 mmol/L (ref 22–32)
Calcium: 9 mg/dL (ref 8.9–10.3)
Chloride: 102 mmol/L (ref 98–111)
Creatinine, Ser: 0.69 mg/dL (ref 0.61–1.24)
GFR, Estimated: >60 mL/min (ref >60)
Glucose, Bld: 204 mg/dL — ABNORMAL HIGH (ref 70–99)
Potassium: 3.4 mmol/L — ABNORMAL LOW (ref 3.5–5.1)
Sodium: 137 mmol/L (ref 135–145)

## 2023-12-22 LAB — CBC WITH DIFFERENTIAL/PLATELET
Abs Immature Granulocytes: 0.05 10*3/uL (ref 0.00–0.07)
Basophils Absolute: 0.1 10*3/uL (ref 0.0–0.1)
Basophils Relative: 1 %
Eosinophils Absolute: 0.7 10*3/uL — ABNORMAL HIGH (ref 0.0–0.5)
Eosinophils Relative: 6 %
HCT: 36.6 % — ABNORMAL LOW (ref 39.0–52.0)
Hemoglobin: 12.4 g/dL — ABNORMAL LOW (ref 13.0–17.0)
Immature Granulocytes: 0 %
Lymphocytes Relative: 26 %
Lymphs Abs: 3.4 10*3/uL (ref 0.7–4.0)
MCH: 29.8 pg (ref 26.0–34.0)
MCHC: 33.9 g/dL (ref 30.0–36.0)
MCV: 88 fL (ref 80.0–100.0)
Monocytes Absolute: 0.9 10*3/uL (ref 0.1–1.0)
Monocytes Relative: 7 %
Neutro Abs: 8.2 10*3/uL — ABNORMAL HIGH (ref 1.7–7.7)
Neutrophils Relative %: 60 %
Platelets: 365 10*3/uL (ref 150–400)
RBC: 4.16 MIL/uL — ABNORMAL LOW (ref 4.22–5.81)
RDW: 13.2 % (ref 11.5–15.5)
WBC: 13.4 10*3/uL — ABNORMAL HIGH (ref 4.0–10.5)
nRBC: 0 % (ref 0.0–0.2)

## 2023-12-22 LAB — I-STAT CG4 LACTIC ACID, ED: Lactic Acid, Venous: 1.3 mmol/L (ref 0.5–1.9)

## 2023-12-22 LAB — GLUCOSE, CAPILLARY: Glucose-Capillary: 143 mg/dL — ABNORMAL HIGH (ref 70–99)

## 2023-12-22 LAB — CBG MONITORING, ED: Glucose-Capillary: 221 mg/dL — ABNORMAL HIGH (ref 70–99)

## 2023-12-22 MED ORDER — BISACODYL 5 MG PO TBEC: 5.0000 mg | DELAYED_RELEASE_TABLET | Freq: Every day | ORAL | Status: DC | PRN

## 2023-12-22 MED ORDER — VANCOMYCIN HCL 1500 MG/300ML IV SOLN
1500.0000 mg | Freq: Once | INTRAVENOUS | Status: AC
  Administered 2023-12-22: 1500 mg via INTRAVENOUS
  Filled 2023-12-22: qty 300

## 2023-12-22 MED ORDER — HYDRALAZINE HCL 20 MG/ML IJ SOLN: 10.0000 mg | INTRAMUSCULAR | Status: DC | PRN

## 2023-12-22 MED ORDER — INSULIN ASPART 100 UNIT/ML IJ SOLN
0.0000 [IU] | Freq: Three times a day (TID) | INTRAMUSCULAR | Status: DC
  Administered 2023-12-23: 2 [IU] via SUBCUTANEOUS
  Administered 2023-12-23 – 2023-12-26 (×4): 1 [IU] via SUBCUTANEOUS

## 2023-12-22 MED ORDER — SODIUM CHLORIDE 0.9 % IV SOLN
3.0000 g | Freq: Four times a day (QID) | INTRAVENOUS | Status: DC
  Administered 2023-12-23 – 2023-12-26 (×14): 3 g via INTRAVENOUS
  Filled 2023-12-22 (×15): qty 8

## 2023-12-22 MED ORDER — ACETAMINOPHEN 650 MG RE SUPP: 650.0000 mg | Freq: Four times a day (QID) | RECTAL | Status: DC | PRN

## 2023-12-22 MED ORDER — VANCOMYCIN HCL 1500 MG/300ML IV SOLN
1500.0000 mg | Freq: Two times a day (BID) | INTRAVENOUS | Status: DC
  Filled 2023-12-22: qty 300

## 2023-12-22 MED ORDER — ONDANSETRON HCL 4 MG PO TABS: 4.0000 mg | ORAL_TABLET | Freq: Four times a day (QID) | ORAL | Status: DC | PRN

## 2023-12-22 MED ORDER — POTASSIUM CHLORIDE CRYS ER 20 MEQ PO TBCR
40.0000 meq | EXTENDED_RELEASE_TABLET | Freq: Once | ORAL | Status: AC
  Administered 2023-12-22: 40 meq via ORAL
  Filled 2023-12-22: qty 2

## 2023-12-22 MED ORDER — ACETAMINOPHEN 325 MG PO TABS: 650.0000 mg | ORAL_TABLET | Freq: Four times a day (QID) | ORAL | Status: DC | PRN

## 2023-12-22 MED ORDER — ATORVASTATIN CALCIUM 40 MG PO TABS
40.0000 mg | ORAL_TABLET | Freq: Every day | ORAL | Status: DC
  Administered 2023-12-23 – 2023-12-26 (×4): 40 mg via ORAL
  Filled 2023-12-22 (×4): qty 1

## 2023-12-22 MED ORDER — SODIUM CHLORIDE 0.9 % IV SOLN: 3.0000 g | Freq: Four times a day (QID) | INTRAVENOUS | Status: DC

## 2023-12-22 MED ORDER — GABAPENTIN 100 MG PO CAPS
100.0000 mg | ORAL_CAPSULE | Freq: Four times a day (QID) | ORAL | Status: DC | PRN
  Administered 2023-12-22 – 2023-12-25 (×4): 200 mg via ORAL
  Filled 2023-12-22 (×4): qty 2

## 2023-12-22 MED ORDER — VANCOMYCIN HCL 1500 MG/300ML IV SOLN
1500.0000 mg | Freq: Two times a day (BID) | INTRAVENOUS | Status: DC
  Administered 2023-12-23: 1500 mg via INTRAVENOUS
  Filled 2023-12-22 (×2): qty 300

## 2023-12-22 MED ORDER — ONDANSETRON HCL 4 MG/2ML IJ SOLN: 4.0000 mg | Freq: Four times a day (QID) | INTRAMUSCULAR | Status: DC | PRN

## 2023-12-22 MED ORDER — SENNOSIDES-DOCUSATE SODIUM 8.6-50 MG PO TABS: 1.0000 | ORAL_TABLET | Freq: Every evening | ORAL | Status: DC | PRN

## 2023-12-22 MED ORDER — PIPERACILLIN-TAZOBACTAM 3.375 G IVPB 30 MIN
3.3750 g | Freq: Once | INTRAVENOUS | Status: AC
  Administered 2023-12-22: 3.375 g via INTRAVENOUS
  Filled 2023-12-22: qty 50

## 2023-12-22 MED ORDER — HYDROCODONE-ACETAMINOPHEN 5-325 MG PO TABS
1.0000 | ORAL_TABLET | ORAL | Status: DC | PRN
  Administered 2023-12-22 – 2023-12-24 (×8): 2 via ORAL
  Administered 2023-12-25: 1 via ORAL
  Administered 2023-12-25 (×2): 2 via ORAL
  Administered 2023-12-26: 1 via ORAL
  Filled 2023-12-22 (×6): qty 2
  Filled 2023-12-22: qty 1
  Filled 2023-12-22 (×4): qty 2
  Filled 2023-12-22: qty 1
  Filled 2023-12-22: qty 2

## 2023-12-22 MED ORDER — INSULIN ASPART 100 UNIT/ML IJ SOLN: 0.0000 [IU] | Freq: Every day | INTRAMUSCULAR | Status: DC

## 2023-12-22 NOTE — ED Notes (Signed)
 ED TO INPATIENT HANDOFF REPORT  ED Nurse Name and Phone #: Johnney Killian Name/Age/Gender Mark Phillips 53 y.o. male Room/Bed: 029C/029C  Code Status   Code Status: Full Code  Home/SNF/Other Home Patient oriented to: self, place, time, and situation Is this baseline? Yes   Triage Complete: Triage complete  Chief Complaint Gangrene of toe of right foot (HCC) [I96]  Triage Note Pt c/o increasing swelling and discoloration to R foot since having an abdominal aortogram w/ lower extremity around 3/3.   Pt reports R great toe and R 5th digit have turned black.  Pt has been unable to get an appointment w/ his Vascular Surgeon.       Allergies Allergies  Allergen Reactions   Ventolin [Albuterol] Swelling    Immediate lip swelling    Level of Care/Admitting Diagnosis ED Disposition     ED Disposition  Admit   Condition  --   Comment  Hospital Area: [facility] [[id number]]  Level of Care: Med-Surg [16]  May admit patient to [facility] or [facility] if equivalent level of care is available:: No  Covid Evaluation: Asymptomatic - no recent exposure (last 10 days) testing not required  Diagnosis: Gangrene of toe of right foot [facility]) [[id number]]  Admitting Physician: Charlsie Quest [[id number]]  Attending Physician: Charlsie Quest [[id number]]  Certification:: I certify this patient will need inpatient services for at least 2 midnights  Expected Medical Readiness: 12/24/2023          B Medical/Surgery History 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   PONV (postoperative nausea and vomiting)    Sleep apnea    Type 2 diabetes mellitus (HCC)    diagnosed age 53   Past Surgical History:  Procedure Laterality Date   ABDOMINAL AORTOGRAM W/LOWER EXTREMITY N/A 12/09/2023   Procedure: ABDOMINAL AORTOGRAM  W/LOWER EXTREMITY;  Surgeon: Nada Libman, MD;  Location: [facility] INVASIVE CV LAB;  Service: Cardiovascular;  Laterality: N/A;   ANKLE ARTHROSCOPY Right 2006   ANKLE RECONSTRUCTION Left    lt reconstruction and rt scope sx   APPENDECTOMY  2000   BIOPSY  09/19/2016   Procedure: BIOPSY;  Surgeon: Corbin Ade, MD;  Location: [facility]O SUITE;  Service: Endoscopy;;  colon   COLON SURGERY  02/1999   removal of 8 inches due to perforation.   COLONOSCOPY WITH PROPOFOL N/A 09/19/2016   Procedure: COLONOSCOPY WITH PROPOFOL;  Surgeon: Corbin Ade, MD;  Location: [facility];  Service: Endoscopy;  Laterality: N/A;  930    LUMBAR DISC SURGERY     July 2017 - Dr. Channing Mutters   PERIPHERAL VASCULAR BALLOON ANGIOPLASTY  12/09/2023   Procedure: PERIPHERAL VASCULAR BALLOON ANGIOPLASTY;  Surgeon: Nada Libman, MD;  Location: [facility] INVASIVE CV LAB;  Service: Cardiovascular;;  RT SFA, RT Common femoral   SHOULDER ARTHROSCOPY WITH SUBACROMIAL DECOMPRESSION AND OPEN ROTATOR C Right 02/25/2013   Procedure: RIGHT SHOULDER ARTHROSCOPY WITH SUBACROMIAL DECOMPRESSION AND MINI OPEN ROTATOR CUFF REPAIR;  Surgeon: Javier Docker, MD;  Location: [facility];  Service: Orthopedics;  Laterality: Right;   WISDOM TOOTH EXTRACTION  2011   WISDOM TOOTH EXTRACTION Bilateral      A IV Location/Drains/Wounds Patient Lines/Drains/Airways Status     Active Line/Drains/Airways     Name  Placement date Placement time Site Days   Peripheral IV 12/22/23 20 G Anterior;Left Forearm 12/22/23  --  Forearm  less than 1            Intake/Output Last 24 hours No intake or output data in the 24 hours ending 12/22/23 2037  Labs/Imaging Results for orders placed or performed during the hospital encounter of 12/22/23 (from the past 48 hours)  Basic metabolic panel     Status: Abnormal   Collection Time: 12/22/23  2:25 PM  Result Value Ref Range   Sodium 137 135 - 145 mmol/L   Potassium 3.4 (L) 3.5 - 5.1 mmol/L   Chloride 102 98 - 111  mmol/L   CO2 25 22 - 32 mmol/L   Glucose, Bld 204 (H) 70 - 99 mg/dL    Comment: Glucose reference range applies only to samples taken after fasting for at least 8 hours.   BUN 7 6 - 20 mg/dL   Creatinine, Ser 1.61 0.61 - 1.24 mg/dL   Calcium 9.0 8.9 - 09.6 mg/dL   GFR, Estimated >04 >54 mL/min    Comment: (NOTE) Calculated using the CKD-EPI Creatinine Equation (2021)    Anion gap 10 5 - 15    Comment: Performed at [facility], [address], [place], [place] [address]  CBC with Differential     Status: Abnormal   Collection Time: 12/22/23  2:25 PM  Result Value Ref Range   WBC 13.4 (H) 4.0 - 10.5 K/uL   RBC 4.16 (L) 4.22 - 5.81 MIL/uL   Hemoglobin 12.4 (L) 13.0 - 17.0 g/dL   HCT 91.4 (L) 78.2 - 95.6 %   MCV 88.0 80.0 - 100.0 fL   MCH 29.8 26.0 - 34.0 pg   MCHC 33.9 30.0 - 36.0 g/dL   RDW 21.3 08.6 - 57.8 %   Platelets 365 150 - 400 K/uL   nRBC 0.0 0.0 - 0.2 %   Neutrophils Relative % 60 %   Neutro Abs 8.2 (H) 1.7 - 7.7 K/uL   Lymphocytes Relative 26 %   Lymphs Abs 3.4 0.7 - 4.0 K/uL   Monocytes Relative 7 %   Monocytes Absolute 0.9 0.1 - 1.0 K/uL   Eosinophils Relative 6 %   Eosinophils Absolute 0.7 (H) 0.0 - 0.5 K/uL   Basophils Relative 1 %   Basophils Absolute 0.1 0.0 - 0.1 K/uL   Immature Granulocytes 0 %   Abs Immature Granulocytes 0.05 0.00 - 0.07 K/uL    Comment: Performed at [facility], [address] [place], [place] [address]  CBG monitoring, ED     Status: Abnormal   Collection Time: 12/22/23  2:38 PM  Result Value Ref Range   Glucose-Capillary 221 (H) 70 - 99 mg/dL    Comment: Glucose reference range applies only to samples taken after fasting for at least 8 hours.  I-Stat Lactic Acid     Status: None   Collection Time: 12/22/23  2:43 PM  Result Value Ref Range   Lactic Acid, Venous 1.3 0.5 - 1.9 mmol/L   DG Foot Complete Right Result Date: 12/22/2023 CLINICAL DATA:  wound, eval for deep space infection. EXAM: RIGHT FOOT COMPLETE - 3+  VIEW COMPARISON:  12/07/2023. FINDINGS: No acute fracture or dislocation. No aggressive osseous lesion. No significant arthritis of imaged joints. Calcaneal spur noted along the Achilles tendon and Plantar aponeurosis attachment sites. No focal soft tissue swelling. No focal soft tissue defect or air within the soft  tissue. No radiopaque foreign bodies. IMPRESSION: No acute osseous abnormality of the right foot. Electronically Signed   By: Jules Schick M.D.   On: 12/22/2023 15:46    Pending Labs Unresulted Labs (From admission, onward)     Start     Ordered   12/23/23 0500  Basic metabolic panel  Tomorrow morning,   R        12/22/23 2005   12/23/23 0500  CBC  Tomorrow morning,   R        12/22/23 2005   12/22/23 1623  Blood culture (routine x 2)  BLOOD CULTURE X 2,   R      12/22/23 1623            Vitals/Pain Today's Vitals   12/22/23 1815 12/22/23 1830 12/22/23 1840 12/22/23 1845  BP: (!) 159/93 (!) 151/91  (!) 144/97  Pulse: 77 71  77  Resp:  18    Temp:   98.5 F (36.9 C)   TempSrc:   Oral   SpO2: 100% 100%  100%  Weight:      Height:      PainSc:        Isolation Precautions No active isolations  Medications Medications  vancomycin (VANCOREADY) IVPB 1500 mg/300 mL (1,500 mg Intravenous New Bag/Given 12/22/23 1907)  vancomycin (VANCOREADY) IVPB 1500 mg/300 mL (has no administration in time range)  insulin aspart (novoLOG) injection 0-9 Units (has no administration in time range)  insulin aspart (novoLOG) injection 0-5 Units (has no administration in time range)  acetaminophen (TYLENOL) tablet 650 mg (has no administration in time range)    Or  acetaminophen (TYLENOL) suppository 650 mg (has no administration in time range)  HYDROcodone-acetaminophen (NORCO/VICODIN) 5-325 MG per tablet 1-2 tablet (has no administration in time range)  ondansetron (ZOFRAN) tablet 4 mg (has no administration in time range)    Or  ondansetron (ZOFRAN) injection 4 mg (has no  administration in time range)  senna-docusate (Senokot-S) tablet 1 tablet (has no administration in time range)  bisacodyl (DULCOLAX) EC tablet 5 mg (has no administration in time range)  potassium chloride SA (KLOR-CON M) CR tablet 40 mEq (has no administration in time range)  atorvastatin (LIPITOR) tablet 40 mg (has no administration in time range)  gabapentin (NEURONTIN) capsule 100-200 mg (has no administration in time range)  hydrALAZINE (APRESOLINE) injection 10 mg (has no administration in time range)  piperacillin-tazobactam (ZOSYN) IVPB 3.375 g (0 g Intravenous Stopped 12/22/23 1802)    Mobility walks     Focused Assessments Neuromuscular/Peripheral Vascular   R Recommendations: See Admitting Provider Note  Report given to:   Additional Notes: pt eating dinner tray at 8:30pm, no planned procedures at this time

## 2023-12-22 NOTE — ED Triage Notes (Signed)
 Pt c/o increasing swelling and discoloration to R foot since having an abdominal aortogram w/ lower extremity around 3/3.   Pt reports R great toe and R 5th digit have turned black.  Pt has been unable to get an appointment w/ his Vascular Surgeon.

## 2023-12-22 NOTE — ED Provider Notes (Signed)
 [facility] EM[facility]GENCY DE[facility]MENT AT [facility] Provider Note   CSN: [id number] Arrival date & time: 12/22/23  1223     History  Chief Complaint  Patient presents with   Circulatory Problem    Mark Phillips is a 53 y.o. male.  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  Patient was seen in the ED few days ago for his toes turning black and had CT angio at that time that showed patent vessels and stenting.  He reports since last night he has had increased swelling and redness to his right foot and saw some drainage this morning from a wound on his big toe.  He states that he was unable to get to the vascular office this morning and came to the ER.  He states he has had no fevers.  He denies any new numbness or weakness or significant pain.  The history is provided by the patient.       Home Medications Prior to Admission medications   Medication Sig Start Date End Date Taking? Authorizing Provider  aspirin EC 81 MG tablet Take 1 tablet (81 mg total) by mouth daily. Swallow whole. 12/09/23   Lewie Chamber, MD  atorvastatin (LIPITOR) 40 MG tablet Take 1 tablet (40 mg total) by mouth daily. 12/10/23   Lewie Chamber, MD  clopidogrel (PLAVIX) 75 MG tablet Take 1 tablet (75 mg total) by mouth daily with breakfast. 12/10/23   Lewie Chamber, MD  ibuprofen (ADVIL) 200 MG tablet Take 800 mg by mouth 2 (two) times daily as needed for headache or moderate pain (pain score 4-6).    [provider]  metFORMIN (GLUCOPHAGE) 1000 MG tablet TAKE 1 Tablet  BY MOUTH TWICE DAILY WITH A MEAL Patient taking differently: Take 1,000 mg by mouth 2 (two) times daily as needed (hyperglycemia). 01/06/19   Jacquelin Hawking, PA-C  oxyCODONE (OXY IR/ROXICODONE) 5 MG immediate  release tablet Take 1 tablet (5 mg total) by mouth every 6 (six) hours as needed for moderate pain (pain score 4-6) or severe pain (pain score 7-10). 12/09/23   Lewie Chamber, MD      Allergies    Ventolin [albuterol]    Review of Systems   Review of Systems  Physical Exam Updated Vital Signs BP (!) 167/86 (BP Location: Left Arm)   Pulse 78   Temp 98.1 F (36.7 C)   Resp 18   Ht 5\' 11"  (1.803 m)   Wt 74.8 kg   SpO2 100%   BMI 23.01 kg/m  Physical Exam Vitals and nursing note reviewed.  Constitutional:      General: He is not in acute distress.    Appearance: Normal appearance.  HENT:     Head: Normocephalic and atraumatic.     Nose: Nose normal.     Mouth/Throat:     Mouth: Mucous membranes are moist.  Eyes:     Extraocular Movements: Extraocular movements intact.  Cardiovascular:     Rate and Rhythm: Normal rate.     Pulses: Normal pulses.  Pulmonary:     Effort: Pulmonary effort is normal.  Musculoskeletal:        General: Normal range of motion.     Cervical back: Normal range of motion.  Skin:    General: Skin is warm and dry.  Comments: Black discoloration to R 1st toe, superficial wound in webspace between 1st and 2nd toes, no active drainage, mild erythema and swelling, no significant warmth  Neurological:     Mental Status: He is alert and oriented to person, place, and time. Mental status is at baseline.     Comments: Insensate to toes of right foot, at baseline per patient, no other neurologic deficits  Psychiatric:        Mood and Affect: Mood normal.        Behavior: Behavior normal.     ED Results / Procedures / Treatments   Labs (all labs ordered are listed, but only abnormal results are displayed) Labs Reviewed  CBC WITH DIFFERENTIAL/PLATELET - Abnormal; Notable for the following components:      Result Value   WBC 13.4 (*)    RBC 4.16 (*)    Hemoglobin 12.4 (*)    HCT 36.6 (*)    Neutro Abs 8.2 (*)    Eosinophils Absolute 0.7 (*)     All other components within normal limits  CBG MONITORING, ED - Abnormal; Notable for the following components:   Glucose-Capillary 221 (*)    All other components within normal limits  BASIC METABOLIC PANEL  I-STAT CG4 LACTIC ACID, ED    EKG None  Radiology No results found.  Procedures Procedures    Medications Ordered in ED Medications - No data to display  ED Course/ Medical Decision Making/ A&P Clinical Course as of 12/22/23 1509  Mon Dec 22, 2023  1344 Evaluated by Erin Hearing PA with vascular at bedside. Appears to have good blood flow, suspect possible early infection of the foot. [VK]  1503 Stable HO VK Having chronic foot wound. XR and labs pending. Vascular seen. Plan to follow up with podiatry. Plan for bactrim in OP setting. [CC]    Clinical Course User Index [CC] Glyn Ade, MD [VK] Rexford Maus, DO                                 Medical Decision Making This patient presents to the ED with chief complaint(s) of R foot pain/swelling with pertinent past medical history of DM, recent iliac dissection with stenting of the R femoral artery which further complicates the presenting complaint. The complaint involves an extensive differential diagnosis and also carries with it a high risk of complications and morbidity.    The differential diagnosis includes patient has good DP pulses making an acute occlusion in his foot unlikely, considering cellulitis, osteomyelitis, normal vital signs on exam making sepsis less likely  Additional history obtained: Additional history obtained from N/A Records reviewed previous admission documents, recent ED records  ED Course and Reassessment: On patient's arrival he is hemodynamically stable in no acute distress and is neurovascularly intact without acute new deficits.  Vascular surgery was at bedside on my evaluation of the patient.  Recommend foot x-ray and labs to evaluate for osteomyelitis or other deep space  infection though suspect patient has a cellulitis on exam.  Independent labs interpretation:  The following labs were independently interpreted: leukocytosis, mild hyperglycemia, lactic normal  Independent visualization of imaging: - N/A  Consultation: - Consulted or discussed management/test interpretation w/ external professional: Vascular   Amount and/or Complexity of Data Reviewed Labs: ordered. Radiology: ordered.          Final Clinical Impression(s) / ED Diagnoses Final diagnoses:  Ulcer of right foot, limited to  breakdown of skin (HCC)  Cellulitis of toe of right foot    Rx / DC Orders ED Discharge Orders     None         Rexford Maus, DO 12/22/23 1511

## 2023-12-22 NOTE — ED Provider Triage Note (Signed)
 Emergency Medicine Provider Triage Evaluation Note  Mark Phillips , a 53 y.o. male  was evaluated in triage.  Pt complains of ongoing swelling, discoloration of right foot with worsening blackness of 5th toe, ongoing discoloration of right great toe.  Runoff CT from 3 days ago with no acute arterial compromise, he does have some distal vascular compromise.  Plan had been to follow-up with the vascular surgeon today, patient reports that he called the vascular office, did not get through to a nurse or provider, he talked to the secretary and they told him to come to the ED.  Review of Systems  Positive: Blackened toe, increased pain, increased swelling Negative:   Physical Exam  BP (!) 167/86 (BP Location: Left Arm)   Pulse 78   Temp 98.1 F (36.7 C)   Resp 18   Ht 5\' 11"  (1.803 m)   Wt 74.8 kg   SpO2 100%   BMI 23.01 kg/m  Gen:   Awake, no distress  Resp:  Normal effort  MSK:   Moves extremities without difficulty  Other:  Dp, pt pulses are palpable, 2+ on the affected extremity. Does have blackening of great toe and 5th toe with some surrounding soft tissue swelling  Medical Decision Making  Medically screening exam initiated at 1:06 PM.  Appropriate orders placed.  Huston Stonehocker was informed that the remainder of the evaluation will be completed by another provider, this initial triage assessment does not replace that evaluation, and the importance of remaining in the ED until their evaluation is complete.  Workup initiated in triage    Olene Floss, [place] 12/22/23 1310

## 2023-12-22 NOTE — ED Notes (Signed)
 Attempted to put patient into a patient gown, patient stated that he would not like to put the gown on and that the last time he was in the hospital he was able to be in his own clothes.

## 2023-12-22 NOTE — Consult Note (Addendum)
 Hospital Consult    Reason for Consult:  changes to right foot Requesting Physician:  ER MRN #:  [id number]  History of Present Illness: This is a 53 y.o. male who is s/p angiogram with stent of the right SFA and right CIA for painful right blue toe on 12/09/2023 by Dr. Myra Gianotti.    The pt presented to the [facility] ER on 3/14 with c/o his toe starting to turn black as well as the 5th toe.  Apparently, he was supposed to have an appt in our office today but I do not see where this occurred.  He presented to the [facility] ED stating he could not get an appt in our office.  He states that his wife called at 8am, 10am and 11am.  They were told that the triage nurse had not been able to evaluate and that he should go to the ER.    Pt states that he started having significant swelling in the right foot yesterday and that his great toe and 5th toe have worsened.  He noticed some drainage from the base of the great toe this afternoon.  He denies any chills but did get really cold last night.  He continues to smoke.  He does not have a podiatrist.   The pt is on a statin for cholesterol management.  The pt is on a daily aspirin.   Other AC:  Plavix The pt is not on medication for hypertension.   The pt is  on medication for diabetes PTA. 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   PONV (postoperative nausea and vomiting)    Sleep apnea    Type 2 diabetes mellitus (HCC)    diagnosed age 64    Past Surgical History:  Procedure Laterality Date   ABDOMINAL AORTOGRAM W/LOWER EXTREMITY N/A 12/09/2023   Procedure: ABDOMINAL AORTOGRAM W/LOWER EXTREMITY;  Surgeon: Nada Libman, MD;  Location: [facility] INVASIVE CV LAB;  Service: Cardiovascular;  Laterality: N/A;   ANKLE ARTHROSCOPY Right 2006   ANKLE RECONSTRUCTION Left    lt  reconstruction and rt scope sx   APPENDECTOMY  2000   BIOPSY  09/19/2016   Procedure: BIOPSY;  Surgeon: Corbin Ade, MD;  Location: [facility] ENDO SUITE;  Service: Endoscopy;;  colon   COLON SURGERY  02/1999   removal of 8 inches due to perforation.   COLONOSCOPY WITH PROPOFOL N/A 09/19/2016   Procedure: COLONOSCOPY WITH PROPOFOL;  Surgeon: Corbin Ade, MD;  Location: [facility] ENDO SUITE;  Service: Endoscopy;  Laterality: N/A;  930    LUMBAR DISC SURGERY     July 2017 - Dr. Channing Mutters   PERIPHERAL VASCULAR BALLOON ANGIOPLASTY  12/09/2023   Procedure: PERIPHERAL VASCULAR BALLOON ANGIOPLASTY;  Surgeon: Nada Libman, MD;  Location: [facility] INVASIVE CV LAB;  Service: Cardiovascular;;  RT SFA, RT Common femoral   SHOULDER ARTHROSCOPY WITH SUBACROMIAL DECOMPRESSION AND OPEN ROTATOR C Right 02/25/2013   Procedure: RIGHT SHOULDER ARTHROSCOPY WITH SUBACROMIAL DECOMPRESSION AND MINI OPEN ROTATOR CUFF REPAIR;  Surgeon: Javier Docker, MD;  Location: [facility] ORS;  Service: Orthopedics;  Laterality: Right;   WISDOM TOOTH EXTRACTION  2011   WISDOM TOOTH EXTRACTION Bilateral     Allergies  Allergen Reactions   Ventolin [Albuterol] Swelling    Immediate  lip swelling    Prior to Admission medications   Medication Sig Start Date End Date Taking? Authorizing Provider  aspirin EC 81 MG tablet Take 1 tablet (81 mg total) by mouth daily. Swallow whole. 12/09/23   Lewie Chamber, MD  atorvastatin (LIPITOR) 40 MG tablet Take 1 tablet (40 mg total) by mouth daily. 12/10/23   Lewie Chamber, MD  clopidogrel (PLAVIX) 75 MG tablet Take 1 tablet (75 mg total) by mouth daily with breakfast. 12/10/23   Lewie Chamber, MD  ibuprofen (ADVIL) 200 MG tablet Take 800 mg by mouth 2 (two) times daily as needed for headache or moderate pain (pain score 4-6).    [provider]  metFORMIN (GLUCOPHAGE) 1000 MG tablet TAKE 1 Tablet  BY MOUTH TWICE DAILY WITH A MEAL Patient taking differently: Take 1,000 mg by mouth 2 (two) times daily as needed  (hyperglycemia). 01/06/19   Jacquelin Hawking, PA-C  oxyCODONE (OXY IR/ROXICODONE) 5 MG immediate release tablet Take 1 tablet (5 mg total) by mouth every 6 (six) hours as needed for moderate pain (pain score 4-6) or severe pain (pain score 7-10). 12/09/23   Lewie Chamber, MD    Social History   Socioeconomic History   Marital status: Married    Spouse name: Not on file   Number of children: Not on file   Years of education: Not on file   Highest education level: Not on file  Occupational History   Not on file  Tobacco Use   Smoking status: Every Day    Current packs/day: 1.00    Average packs/day: 1 pack/day for 30.5 years (30.5 ttl pk-yrs)    Types: Cigarettes    Start date: 06/13/1993   Smokeless tobacco: Former    Types: Chew    Quit date: 10/07/1993  Vaping Use   Vaping status: Never Used  Substance and Sexual Activity   Alcohol use: No   Drug use: No   Sexual activity: Yes    Birth control/protection: None  Other Topics Concern   Not on file  Social History Narrative   Not on file   Social Drivers of Health   Financial Resource Strain: Not on file  Food Insecurity: No Food Insecurity (12/08/2023)   Hunger Vital Sign    Worried About Running Out of Food in the Last Year: Never true    Ran Out of Food in the Last Year: Never true  Transportation Needs: No Transportation Needs (12/08/2023)   PRAPARE - Administrator, Civil Service (Medical): No    Lack of Transportation (Non-Medical): No  Physical Activity: Not on file  Stress: Not on file  Social Connections: Not on file  Intimate Partner Violence: Not At Risk (12/08/2023)   Humiliation, Afraid, Rape, and Kick questionnaire    Fear of Current or Ex-Partner: No    Emotionally Abused: No    Physically Abused: No    Sexually Abused: No     Family History  Problem Relation Age of Onset   Heart attack Mother    Hypertension Mother    Diabetes Mellitus II Mother    COPD Father    COPD Maternal Grandmother     Colon cancer Maternal Grandmother    Asthma Sister     ROS: [x]  Positive   [ ]  Negative   [ ]  All sytems reviewed and are negative  Cardiac: []  chest pain/pressure []  hx MI []  SOB   Vascular: []  pain in right 5th toe [x]  non-healing ulcers []   hx of DVT [x]  swelling in right foot  Pulmonary: []  asthma/wheezing []  home O2  Neurologic: []  hx of CVA []  mini stroke   Hematologic: []  hx of cancer  Endocrine:   [x]  diabetes []  thyroid disease  GI [x]  GERD  GU: [x]  hx kidney stones  Psychiatric: []  anxiety []  depression  Musculoskeletal: []  lumbar disc disease  Integumentary: []  rashes []  ulcers  Constitutional: []  fever  []  chills  Physical Examination  Vitals:   12/22/23 1237  BP: (!) 167/86  Pulse: 78  Resp: 18  Temp: 98.1 F (36.7 C)  SpO2: 100%   Body mass index is 23.01 kg/m.  General:  WDWN in NAD Gait: Not observed HENT: WNL, normocephalic Pulmonary: normal non-labored breathing Cardiac: regular Skin: without rashes Vascular Exam/Pulses: Palpable bilateral radial and DP pulses and left femoral pulse Extremities:        Musculoskeletal: no muscle wasting or atrophy  Neurologic: A&O X 3 Psychiatric:  The pt has Normal affect.   CBC    Component Value Date/Time   WBC 13.9 (H) 12/19/2023 1211   RBC 4.49 12/19/2023 1211   HGB 13.2 12/19/2023 1211   HCT 39.1 12/19/2023 1211   PLT 378 12/19/2023 1211   MCV 87.1 12/19/2023 1211   MCH 29.4 12/19/2023 1211   MCHC 33.8 12/19/2023 1211   RDW 13.0 12/19/2023 1211   LYMPHSABS 3.8 12/19/2023 1211   MONOABS 0.9 12/19/2023 1211   EOSABS 0.8 (H) 12/19/2023 1211   BASOSABS 0.1 12/19/2023 1211    BMET    Component Value Date/Time   NA 138 12/19/2023 1211   K 3.4 (L) 12/19/2023 1211   CL 101 12/19/2023 1211   CO2 26 12/19/2023 1211   GLUCOSE 216 (H) 12/19/2023 1211   BUN 6 12/19/2023 1211   CREATININE 0.64 12/19/2023 1211   CALCIUM 9.1 12/19/2023 1211   GFRNONAA >60  12/19/2023 1211   GFRAA >60 02/25/2020 0834    COAGS: Lab Results  Component Value Date   INR 0.9 12/07/2023     Non-Invasive Vascular Imaging:   None currently    ASSESSMENT/PLAN: This is a 53 y.o. male s/p angiogram with stent of the right SFA and right CIA for painful right blue toe on 12/09/2023 by Dr. Myra Gianotti.    -pt now with gangrenous changes to the right great and 5th toes.  He did have discoloration prior to intervention and most likely, this is demarcation.  He has had some drainage from the right great toe base.   -from vascular standpoint, he is doing well with a palpable right DP pulse and his left groin looks fine from stick site.  He does have f/u in our office on 4/18 with arterial duplex, ABI and visit with PA.  -given drainage from toe, would benefit from x-rays, abx and podiatry consult. -pt continues to smoke.  I discussed with pt that it is critical to work on smoking cessation bc given he is diabetic he is at higher risk for more proximal amputation, heart attack, stroke, and cancers.   -Dr. Karin Lieu to evaluate pt and determine further plan   Doreatha Massed, PA-C [facility] [phone]   VASCULAR STAFF ADDENDUM: I have independently interviewed and examined the patient. I agree with the above.  Patient status post right lower extremity angiogram with stenting of the iliac and SFA for blue toe syndrome. Continues to have changes in the first and fifth toes, with some erythema extending into the metatarsal location. We discussed  that the best course of action is continued medical management for demarcation, however the erythema appreciated is concerning for underlying infection.  Denies fevers chills. Would appreciate podiatry involvement to assess the wounds.  Victorino Sparrow MD [facility] Phone Number: [phone] 12/22/2023 4:26 PM

## 2023-12-22 NOTE — Progress Notes (Addendum)
 Pharmacy Antibiotic Note  Mark Phillips is a 53 y.o. male admitted on 12/22/2023 with  wound infection .  Pharmacy has been consulted for vancomycin dosing. Patient admitted with increased right foot swelling and skin discoloration. Recent ED visit for similar issue. CT angio showed patent vessels and stenting. Today with increased swelling and drainage from wound on big toe. Start empiric antibiotics. WBC wnl, afebrile.  Plan: Vancomycin 1500 mg IV every 12 hours.  Goal trough 15-20 mcg/mL. Goal AUC 400-600. Unasyn 3 grams every 6 hours  Height: 5\' 11"  (180.3 cm) Weight: 74.8 kg (165 lb) IBW/kg (Calculated) : 75.3  Temp (24hrs), Avg:98.3 F (36.8 C), Min:98.1 F (36.7 C), Max:98.5 F (36.9 C)  Recent Labs  Lab 12/19/23 1211 12/22/23 1425 12/22/23 1443  WBC 13.9* 13.4*  --   CREATININE 0.64 0.69  --   LATICACIDVEN  --   --  1.3    Estimated Creatinine Clearance: 114.3 mL/min (by C-G formula based on SCr of 0.69 mg/dL).    Allergies  Allergen Reactions   Ventolin [Albuterol] Swelling    Immediate lip swelling    Antimicrobials this admission: Vancomycin 3/17 >>  Unasyn 3/17 >> Zosyn 3/17 x1  Dose adjustments this admission:   Microbiology results: 3/17 BCx: pending  Thank you for allowing pharmacy to be a part of this patient's care.  Wilmer Floor 12/22/2023 6:42 PM

## 2023-12-22 NOTE — H&P (Signed)
 History and Physical    Mark Phillips IEP:[id number] DOB: 22-May-1971 DOA: 12/22/2023  PCP: [facility]  Patient coming from: Home  I have personally briefly reviewed patient's old medical records in [facility] Link  Chief Complaint: Worsening right foot wounds  HPI: Mark Phillips is a 53 y.o. male with medical history significant for T2DM, HTN, HLD, tobacco use, PAD s/p recent stenting to right SFA and right CIA (12/09/2023) who presents to the ED for evaluation of worsening appearance of foot wounds.  Patient recently admitted 3/2-3/4 for discoloration of the first toe on his right foot.  CTA on admission concerning for RLE PAD.  Vascular surgery were consulted and he underwent angiogram on 3/4 with stenting to right SFA and right CIA.  He was discharged on aspirin, Plavix, statin.  He was seen again in the [facility], ED on 3/14 with concern that his first toe was starting to turn black in addition to discoloration of his fifth toe.  He was supposed to have an outpatient follow-up scheduled vascular surgery but seems like this does not occur.  Patient states that yesterday he began to have swelling of the right foot with worsening black appearance of his right first and fifth toes.  He has had increasing pain.  He says while in the ED waiting area he saw some clear discharge from the base of his right first toe.  Patient does report ongoing tobacco use, smoking 1 pack/day.  He denies alcohol use.  ED Course  Labs/Imaging on admission: I have personally reviewed following labs and imaging studies.  Initial vitals showed BP 167/86, pulse 78, RR 18, temp 98.1 F, SpO2 100% on room air.  Labs show WBC 13.4, hemoglobin 12.4, platelets 365,000, sodium 137, potassium 3.4, bicarb 25, BUN 7, creatinine 0.69, serum glucose 204, lactic acid 1.3.  Blood cultures in process.  Right foot x-ray negative for acute osseous abnormality of the right foot.  Patient was  given IV vancomycin and Zosyn.  Vascular surgery were consulted and have evaluated patient.  They recommended continued medical management for demarcation and podiatry consult.  Case was discussed with podiatry who recommended medical admission and they will see in consultation for potential surgical intervention.  The hospitalist service was consulted to admit.  Review of Systems: All systems reviewed and are negative except as documented in history of present illness above.   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   PONV (postoperative nausea and vomiting)    Sleep apnea    Type 2 diabetes mellitus (HCC)    diagnosed age 62    Past Surgical History:  Procedure Laterality Date   ABDOMINAL AORTOGRAM W/LOWER EXTREMITY N/A 12/09/2023   Procedure: ABDOMINAL AORTOGRAM W/LOWER EXTREMITY;  Surgeon: Nada Libman, MD;  Location: [facility] INVASIVE CV LAB;  Service: Cardiovascular;  Laterality: N/A;   ANKLE ARTHROSCOPY Right 2006   ANKLE RECONSTRUCTION Left    lt reconstruction and rt scope sx   APPENDECTOMY  2000   BIOPSY  09/19/2016   Procedure: BIOPSY;  Surgeon: Corbin Ade, MD;  Location: [facility] ENDO SUITE;  Service: Endoscopy;;  colon   COLON SURGERY  02/1999   removal of 8 inches due to perforation.   COLONOSCOPY WITH PROPOFOL N/A 09/19/2016   Procedure: COLONOSCOPY WITH  PROPOFOL;  Surgeon: Corbin Ade, MD;  Location: [facility] ENDO SUITE;  Service: Endoscopy;  Laterality: N/A;  930    LUMBAR DISC SURGERY     July 2017 - Dr. Channing Mutters   PERIPHERAL VASCULAR BALLOON ANGIOPLASTY  12/09/2023   Procedure: PERIPHERAL VASCULAR BALLOON ANGIOPLASTY;  Surgeon: Nada Libman, MD;  Location: [facility] INVASIVE CV LAB;  Service: Cardiovascular;;  RT SFA, RT Common femoral   SHOULDER ARTHROSCOPY WITH SUBACROMIAL DECOMPRESSION AND OPEN ROTATOR C Right  02/25/2013   Procedure: RIGHT SHOULDER ARTHROSCOPY WITH SUBACROMIAL DECOMPRESSION AND MINI OPEN ROTATOR CUFF REPAIR;  Surgeon: Javier Docker, MD;  Location: [facility] ORS;  Service: Orthopedics;  Laterality: Right;   WISDOM TOOTH EXTRACTION  2011   WISDOM TOOTH EXTRACTION Bilateral     Social History: Patient reports smoking 1 PPD.  He denies alcohol use.  Allergies  Allergen Reactions   Ventolin [Albuterol] Swelling    Immediate lip swelling    Family History  Problem Relation Age of Onset   Heart attack Mother    Hypertension Mother    Diabetes Mellitus II Mother    COPD Father    COPD Maternal Grandmother    Colon cancer Maternal Grandmother    Asthma Sister      Prior to Admission medications   Medication Sig Start Date End Date Taking? Authorizing Provider  aspirin EC 81 MG tablet Take 1 tablet (81 mg total) by mouth daily. Swallow whole. 12/09/23  Yes Lewie Chamber, MD  atorvastatin (LIPITOR) 40 MG tablet Take 1 tablet (40 mg total) by mouth daily. 12/10/23  Yes Lewie Chamber, MD  clopidogrel (PLAVIX) 75 MG tablet Take 1 tablet (75 mg total) by mouth daily with breakfast. 12/10/23  Yes Lewie Chamber, MD  gabapentin (NEURONTIN) 100 MG capsule Take 100-200 mg by mouth every 6 (six) hours as needed (neuropathy). 12/16/23  Yes [provider]  ibuprofen (ADVIL) 200 MG tablet Take 800 mg by mouth 2 (two) times daily as needed for headache or moderate pain (pain score 4-6).   Yes [provider]  metFORMIN (GLUCOPHAGE) 1000 MG tablet TAKE 1 Tablet  BY MOUTH TWICE DAILY WITH A MEAL Patient taking differently: Take 1,000 mg by mouth 2 (two) times daily as needed (hyperglycemia). 01/06/19  Yes Jacquelin Hawking, PA-C  oxyCODONE (OXY IR/ROXICODONE) 5 MG immediate release tablet Take 1 tablet (5 mg total) by mouth every 6 (six) hours as needed for moderate pain (pain score 4-6) or severe pain (pain score 7-10). Patient not taking: Reported on 12/22/2023 12/09/23   Lewie Chamber, MD     Physical Exam: Vitals:   12/22/23 1815 12/22/23 1830 12/22/23 1840 12/22/23 1845  BP: (!) 159/93 (!) 151/91  (!) 144/97  Pulse: 77 71  77  Resp:  18    Temp:   98.5 F (36.9 C)   TempSrc:   Oral   SpO2: 100% 100%  100%  Weight:      Height:       Constitutional: Resting in bed, NAD, calm, comfortable Eyes: EOMI, lids and conjunctivae normal ENMT: Mucous membranes are moist. Posterior pharynx clear of any exudate or lesions.Normal dentition.  Neck: normal, supple, no masses. Respiratory: clear to auscultation bilaterally, no wheezing, no crackles. Normal respiratory effort. No accessory muscle use.  Cardiovascular: Regular rate and rhythm, no murmurs / rubs / gallops.  Edema over right foot. 2+ pedal pulses. Abdomen: no tenderness, no masses palpated. Musculoskeletal: Dry gangrene involving right first and fifth toes Skin: Necrotic changes of  right first and fifth toes with overlying erythema Neurologic: Sensation intact. Strength equal bilaterally. Psychiatric: Normal judgment and insight. Alert and oriented x 3. Normal mood.         EKG: Not performed.  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 male with medical history significant for T2DM, HTN, HLD, tobacco use, PAD s/p recent stenting to right SFA and right CIA (12/09/2023) who is admitted with dry gangrene of right first and fifth toes.  Assessment and Plan: PAD s/p RLE angiogram with stenting to right SFA and right CIA (12/09/2023) now with gangrene right first and fifth toes with superimposed cellulitis: Seen by vascular surgery who recommended  podiatry evaluation.  EDP has spoken with TFA who will see tomorrow, potential surgical intervention anticipated. -Continue IV vancomycin and Unasyn -Follow blood cultures -N.p.o. after midnight -Antiplatelets on hold for now for potential surgery  Type 2 diabetes: Holding metformin.  Placed on SSI.  Hypertension: Not on antihypertensives as an outpatient.  BP potentially elevated due to pain.  Continue pain control.  If remains elevated consider addition of antihypertensive.  Hyperlipidemia: Continue atorvastatin.  Hypokalemia: Oral supplement ordered.  Tobacco use: Patient reports smoking 1 PPD.  Smoking cessation is advised.  He declines nicotine patch.   DVT prophylaxis: SCDs Start: 12/22/23 2004 Code Status: Full code, confirmed with patient on admission Family Communication: Discussed with patient, he has discussed with family Disposition Plan: From home and likely discharge to home pending clinical progress Consults called: Vascular surgery, podiatry Severity of Illness: The appropriate patient status for this patient is INPATIENT. Inpatient status is judged to be reasonable and necessary in order to provide the required intensity of service to ensure the patient's safety. The patient's presenting symptoms, physical exam findings, and initial radiographic and laboratory data in the context of their chronic comorbidities is felt to place them at high risk for further clinical deterioration. Furthermore, it is not anticipated that the patient will be medically stable for discharge from the hospital within 2 midnights of admission.   * I certify that at the point of admission it is my clinical judgment that the patient will require inpatient hospital care spanning beyond 2 midnights from the point of admission due to high intensity of service, high risk for further deterioration and high frequency of surveillance required.Darreld Mclean MD [facility]  If 7PM-7AM, please  contact night-coverage [email or web address]  12/22/2023, 8:14 PM

## 2023-12-22 NOTE — ED Provider Notes (Signed)
 Care of patient received from prior provider at 5:43 PM, please see their note for complete H/P and care plan.  Received handoff per ED course.  Clinical Course as of 12/22/23 1743  Mon Dec 22, 2023  1344 Evaluated by Erin Hearing PA with vascular at bedside. Appears to have good blood flow, suspect possible early infection of the foot. [VK]  1503 Stable HO VK Having chronic foot wound. XR and labs pending. Vascular seen. Plan to follow up with podiatry. Plan for bactrim in OP setting. [CC]    Clinical Course User Index [CC] Glyn Ade, MD [VK] Rexford Maus, DO    Reassessment: Reconsulted vascular surgery.  They recommended podiatry consult.  Consulted podiatry who is in agreement with antibiotics overnight with plan to assess the degree of injury in the a.m.  They declined need for any further imaging pending their evaluation. Disposition:   Based on the above findings, I believe this patient is stable for admission.    Patient/family educated about specific findings on our evaluation and explained exact reasons for admission.  Patient/family educated about clinical situation and time was allowed to answer questions.   Admission team communicated with and agreed with need for admission. Patient admitted. Patient  ready to move at this time.     Emergency Department Medication Summary:   Medications  insulin aspart (novoLOG) injection 0-9 Units (has no administration in time range)  insulin aspart (novoLOG) injection 0-5 Units ( Subcutaneous Not Given 12/22/23 2256)  acetaminophen (TYLENOL) tablet 650 mg (has no administration in time range)    Or  acetaminophen (TYLENOL) suppository 650 mg (has no administration in time range)  HYDROcodone-acetaminophen (NORCO/VICODIN) 5-325 MG per tablet 1-2 tablet (2 tablets Oral Given 12/22/23 2150)  ondansetron (ZOFRAN) tablet 4 mg (has no administration in time range)    Or  ondansetron (ZOFRAN) injection 4 mg (has no administration  in time range)  senna-docusate (Senokot-S) tablet 1 tablet (has no administration in time range)  bisacodyl (DULCOLAX) EC tablet 5 mg (has no administration in time range)  atorvastatin (LIPITOR) tablet 40 mg (has no administration in time range)  gabapentin (NEURONTIN) capsule 100-200 mg (200 mg Oral Given 12/22/23 2152)  hydrALAZINE (APRESOLINE) injection 10 mg (has no administration in time range)  Ampicillin-Sulbactam (UNASYN) 3 g in sodium chloride 0.9 % 100 mL IVPB (has no administration in time range)  vancomycin (VANCOREADY) IVPB 1500 mg/300 mL (has no administration in time range)  piperacillin-tazobactam (ZOSYN) IVPB 3.375 g (0 g Intravenous Stopped 12/22/23 1802)  vancomycin (VANCOREADY) IVPB 1500 mg/300 mL (1,500 mg Intravenous Not Given 12/22/23 2153)  potassium chloride SA (KLOR-CON M) CR tablet 40 mEq (40 mEq Oral Given 12/22/23 2152)            Glyn Ade, MD 12/22/23 2302

## 2023-12-22 NOTE — Hospital Course (Signed)
 Mark Phillips is a 53 y.o. male with medical history significant for T2DM, HTN, HLD, tobacco use, PAD s/p recent stenting to right SFA and right CIA (12/09/2023) who is admitted with dry gangrene of right first and fifth toes.

## 2023-12-23 LAB — BASIC METABOLIC PANEL
Anion gap: 13 (ref 5–15)
BUN: 7 mg/dL (ref 6–20)
CO2: 26 mmol/L (ref 22–32)
Calcium: 8.6 mg/dL — ABNORMAL LOW (ref 8.9–10.3)
Chloride: 101 mmol/L (ref 98–111)
Creatinine, Ser: 0.76 mg/dL (ref 0.61–1.24)
GFR, Estimated: >60 mL/min (ref >60)
Glucose, Bld: 112 mg/dL — ABNORMAL HIGH (ref 70–99)
Potassium: 4.1 mmol/L (ref 3.5–5.1)
Sodium: 140 mmol/L (ref 135–145)

## 2023-12-23 LAB — GLUCOSE, CAPILLARY
Glucose-Capillary: 101 mg/dL — ABNORMAL HIGH (ref 70–99)
Glucose-Capillary: 143 mg/dL — ABNORMAL HIGH (ref 70–99)
Glucose-Capillary: 194 mg/dL — ABNORMAL HIGH (ref 70–99)
Glucose-Capillary: 148 mg/dL — ABNORMAL HIGH (ref 70–99)

## 2023-12-23 LAB — CBC
HCT: 36.2 % — ABNORMAL LOW (ref 39.0–52.0)
Hemoglobin: 12.2 g/dL — ABNORMAL LOW (ref 13.0–17.0)
MCH: 29.5 pg (ref 26.0–34.0)
MCHC: 33.7 g/dL (ref 30.0–36.0)
MCV: 87.4 fL (ref 80.0–100.0)
Platelets: 389 10*3/uL (ref 150–400)
RBC: 4.14 MIL/uL — ABNORMAL LOW (ref 4.22–5.81)
RDW: 13.2 % (ref 11.5–15.5)
WBC: 14.2 10*3/uL — ABNORMAL HIGH (ref 4.0–10.5)
nRBC: 0 % (ref 0.0–0.2)

## 2023-12-23 LAB — SURGICAL PCR SCREEN
MRSA, PCR: NEGATIVE
Staphylococcus aureus: NEGATIVE

## 2023-12-23 MED ORDER — VANCOMYCIN HCL 750 MG/150ML IV SOLN
750.0000 mg | Freq: Three times a day (TID) | INTRAVENOUS | Status: DC
  Administered 2023-12-23 – 2023-12-26 (×9): 750 mg via INTRAVENOUS
  Filled 2023-12-23 (×9): qty 150

## 2023-12-23 MED ORDER — JUVEN PO PACK
1.0000 | PACK | Freq: Two times a day (BID) | ORAL | Status: DC
  Administered 2023-12-23 – 2023-12-24 (×2): 1 via ORAL
  Filled 2023-12-23 (×4): qty 1

## 2023-12-23 MED ORDER — ZINC SULFATE 220 (50 ZN) MG PO CAPS
220.0000 mg | ORAL_CAPSULE | Freq: Every day | ORAL | Status: DC
  Administered 2023-12-23 – 2023-12-26 (×3): 220 mg via ORAL
  Filled 2023-12-23 (×4): qty 1

## 2023-12-23 MED ORDER — PROSOURCE PLUS PO LIQD
30.0000 mL | Freq: Two times a day (BID) | ORAL | Status: DC
  Administered 2023-12-23: 30 mL via ORAL
  Filled 2023-12-23 (×4): qty 30

## 2023-12-23 MED ORDER — NICOTINE 14 MG/24HR TD PT24
14.0000 mg | MEDICATED_PATCH | Freq: Every day | TRANSDERMAL | Status: DC
  Administered 2023-12-23 – 2023-12-26 (×3): 14 mg via TRANSDERMAL
  Filled 2023-12-23 (×4): qty 1

## 2023-12-23 MED ORDER — THIAMINE MONONITRATE 100 MG PO TABS
100.0000 mg | ORAL_TABLET | Freq: Every day | ORAL | Status: DC
  Administered 2023-12-23 – 2023-12-26 (×4): 100 mg via ORAL
  Filled 2023-12-23 (×4): qty 1

## 2023-12-23 MED ORDER — VITAMIN C 500 MG PO TABS
500.0000 mg | ORAL_TABLET | Freq: Every day | ORAL | Status: DC
  Administered 2023-12-23 – 2023-12-26 (×4): 500 mg via ORAL
  Filled 2023-12-23 (×4): qty 1

## 2023-12-23 MED ORDER — ADULT MULTIVITAMIN W/MINERALS CH
1.0000 | ORAL_TABLET | Freq: Every day | ORAL | Status: DC
  Administered 2023-12-23 – 2023-12-26 (×4): 1 via ORAL
  Filled 2023-12-23 (×4): qty 1

## 2023-12-23 MED ORDER — ENSURE ENLIVE PO LIQD
237.0000 mL | Freq: Two times a day (BID) | ORAL | Status: DC
  Administered 2023-12-23 – 2023-12-24 (×2): 237 mL via ORAL

## 2023-12-23 NOTE — Plan of Care (Signed)
  Problem: Education: Goal: Individualized Educational Video(s) Outcome: Progressing   Problem: Coping: Goal: Ability to adjust to condition or change in health will improve Outcome: Progressing   Problem: Fluid Volume: Goal: Ability to maintain a balanced intake and output will improve Outcome: Progressing   Problem: Health Behavior/Discharge Planning: Goal: Ability to identify and utilize available resources and services will improve Outcome: Progressing   Problem: Skin Integrity: Goal: Risk for impaired skin integrity will decrease Outcome: Progressing

## 2023-12-23 NOTE — Anesthesia Preprocedure Evaluation (Addendum)
 Anesthesia Evaluation  Patient identified by MRN, date of birth, ID band Patient awake    Reviewed: Allergy & Precautions, NPO status , Patient's Chart, lab work & pertinent test results  History of Anesthesia Complications (+) PONV and history of anesthetic complications  Airway Mallampati: II  TM Distance: >3 FB Neck ROM: Full    Dental  (+) Teeth Intact   Pulmonary sleep apnea , Current Smoker   breath sounds clear to auscultation       Cardiovascular hypertension, Pt. on medications  Rhythm:Regular Rate:Normal     Neuro/Psych Lumbar HNP     GI/Hepatic ,GERD  ,,  Endo/Other  diabetes, Poorly Controlled, Type 2, Oral Hypoglycemic Agents    Renal/GU      Musculoskeletal  (+) Arthritis ,    Abdominal   Peds  Hematology   Anesthesia Other Findings   Reproductive/Obstetrics                             Anesthesia Physical Anesthesia Plan  ASA: 3  Anesthesia Plan: General   Post-op Pain Management: Tylenol PO (pre-op)*, Celebrex PO (pre-op)* and Minimal or no pain anticipated   Induction: Intravenous  PONV Risk Score and Plan: 1 and Ondansetron, Treatment may vary due to age or medical condition and Dexamethasone  Airway Management Planned: LMA  Additional Equipment: None  Intra-op Plan:   Post-operative Plan: Extubation in OR  Informed Consent: I have reviewed the patients History and Physical, chart, labs and discussed the procedure including the risks, benefits and alternatives for the proposed anesthesia with the patient or authorized representative who has indicated his/her understanding and acceptance.       Plan Discussed with: CRNA and Anesthesiologist  Anesthesia Plan Comments:         Anesthesia Quick Evaluation

## 2023-12-23 NOTE — Progress Notes (Addendum)
 Initial Nutrition Assessment  DOCUMENTATION CODES:   Not applicable  INTERVENTION:   Once diet advanced recommend:, Ensure Enlive po BID, each supplement provides 350 kcal and 20 grams of protein. 1 packet Juven BID, each packet provides 95 calories, 2.5 grams of protein (collagen), to support wound healing  Prosoruce Plus, each packet contains 100 kcal and 15 gm protein  100 mg Thiamine daily Multivitamin with minerals daily 500 mg Vitamin C x 30 days and 220 mg Zinc x 15 days for wound  healing  Monitor magnesium, potassium, and phosphorus daily for at least 3 days, MD to replete as needed, as pt is at risk for refeeding syndrome  NUTRITION DIAGNOSIS:   Increased nutrient needs related to wound healing as evidenced by estimated needs.  GOAL:   Patient will meet greater than or equal to 90% of their needs   MONITOR:   Diet advancement, PO intake, Supplement acceptance, Labs, Skin, Weight trends  REASON FOR ASSESSMENT:   Consult Wound healing  ASSESSMENT:  52 y.o male with PMH of T2DM, HTN, HLD, tobacco use, GERD, PAD. With recent stenting to SFA and right CIA 12/09/2023 who presents for evaluation of worsening right foot wounds.  RD working remotely, no answer when calling patient's room. History obtained through chart review.   Patient recently admitted 3/2-12/09/2023 for discoloration of first toe on his right foot. CTA concerning for RLE PAD. S/P  stenting to right SFA and CIA. Presented to [facility] 3/14 for ongoing concerns of discoloration of his first toe and now his fifth toe. Found to have gangrene of first and fifth toes.   Patient NPO since midnight for evaluation and possible surgical intervention. Limited nutritional history available, reviewed weight history and weight has been trending down however, limited weight history available in the last 3 years. Last two weights recorded have been carried over. RN provided new standing weight of 163 lbs. Edema noted on  right foot which may be masking more weight loss.   Exam deferred to follow up. Once diet advanced monitor lytes, add Thiamine, and add supplements.  Will add a variety of supplements due to patient's preference unknown.  Admit weight: 74.8 kg - carried over  Current weight: 74.8 kg    Average Meal Intake: NPO  Nutritionally Relevant Medications: Scheduled Meds:  (feeding supplement) PROSource Plus  30 mL Oral BID BM   ascorbic acid  500 mg Oral Daily   atorvastatin  40 mg Oral Daily   feeding supplement  237 mL Oral BID BM   insulin aspart  0-5 Units Subcutaneous QHS   insulin aspart  0-9 Units Subcutaneous TID WC   multivitamin with minerals  1 tablet Oral Daily   nutrition supplement (JUVEN)  1 packet Oral BID BM   thiamine  100 mg Oral Daily   zinc sulfate (50mg  elemental zinc)  220 mg Oral Daily   Continuous Infusions:  ampicillin-sulbactam (UNASYN) IV 3 g (12/23/23 0442)   vancomycin     Labs Reviewed: Calcium 8.6 CBG ranges from 101-221 mg/dL over the last 24 hours HgbA1c 7   NUTRITION - FOCUSED PHYSICAL EXAM:  - Deferred to FUP  Diet Order:   Diet Order             Diet NPO time specified  Diet effective midnight                   EDUCATION NEEDS:   No education needs have been identified at this time  Skin:  Skin Assessment: Skin Integrity Issues: Skin Integrity Issues:: Other (Comment) Other: Gangrene on frist and fifth toe on righ foot  Last BM:  PTA  Height:   Ht Readings from Last 1 Encounters:  12/22/23 5\' 11"  (1.803 m)    Weight:   Wt Readings from Last 1 Encounters:  12/22/23 74.8 kg    Ideal Body Weight:     BMI:  Body mass index is 23.01 kg/m.  Estimated Nutritional Needs:   Kcal:  2300-2500 kcal  Protein:  120-140 gm  Fluid:  >2L/day   Elliot Dally, RD Registered Dietitian  See Amion for more information

## 2023-12-23 NOTE — Consult Note (Signed)
 PODIATRY CONSULTATION  NAME Mark Phillips MRN [id number] DOB 1970-11-21 DOA 12/22/2023   Reason for consult: Gangrenous toes right foot  Attending/Consulting physician: Basil Dess MD  History of present illness: Mark Phillips is a 53 y.o. male with medical history significant for T2DM, HTN, HLD, tobacco use, PAD s/p recent stenting to right SFA and right CIA (12/09/2023) who presents to the ED for evaluation of worsening appearance of foot wounds. Patient recently admitted 3/2-3/4 for discoloration of the first toe on his right foot. CTA on admission concerning for RLE PAD. Vascular surgery were consulted and he underwent angiogram on 3/4 with stenting to right SFA and right CIA. He was discharged on aspirin, Plavix, statin.  Patient states that yesterday he began to have swelling of the right foot with worsening black appearance of his right first and fifth toes.  He has had increasing pain.  He says while in the ED waiting area he saw some clear discharge from the base of his right first toe. Patient does report ongoing tobacco use, smoking 1 pack/day.  He denies alcohol use."  Discussed with patient the nature of his toe wounds with the great toe being worse than the fifth toe.  I explained that it does appear there is some level of infection in both toes and it is unlikely the soft tissues will recover as there has been further demarcation following revascularization  He does have sensation and pain in both toes fifth toe worse than the great toe.    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   PONV (postoperative nausea and vomiting)    Sleep apnea    Type 2 diabetes mellitus [facility])    diagnosed age 85       Latest Ref Rng & Units 12/23/2023    6:34 AM 12/22/2023    2:25 PM 12/19/2023   12:11 PM  CBC  WBC 4.0 - 10.5  K/uL 14.2  13.4  13.9   Hemoglobin 13.0 - 17.0 g/dL 40.9  81.1  91.4   Hematocrit 39.0 - 52.0 % 36.2  36.6  39.1   Platelets 150 - 400 K/uL 389  365  378        Latest Ref Rng & Units 12/23/2023    6:34 AM 12/22/2023    2:25 PM 12/19/2023   12:11 PM  BMP  Glucose 70 - 99 mg/dL 782  956  213   BUN 6 - 20 mg/dL 7  7  6    Creatinine 0.61 - 1.24 mg/dL 0.86  5.78  4.69   Sodium 135 - 145 mmol/L 140  137  138   Potassium 3.5 - 5.1 mmol/L 4.1  3.4  3.4   Chloride 98 - 111 mmol/L 101  102  101   CO2 22 - 32 mmol/L 26  25  26    Calcium 8.9 - 10.3 mg/dL 8.6  9.0  9.1       Physical Exam: Lower Extremity Exam Vasc: R - PT palpable, DP palpable. Cap refill absent to great and fifth toe  L - PT palpable, DP palpable. Cap refill <3 sec to digits  Derm: R -gangrenous changes noted to the lateral aspect of the right hallux as well as the medial aspect of the right fifth toe with erythema extending  to the MPJ level on both toes.  Slight maceration of the great toe worse than the fifth toe no malodor or active drainage noted  L - Normal temp/texture/turgor with no open lesion or clinical signs of infection   MSK:  R -  No gross deformities. Compartments soft, non-tender, compressible  L -  No gross deformities. Compartments soft, non-tender, compressible  Neuro: R - Gross sensation intact. Gross motor function intact   L - Gross sensation intact. Gross motor function intact    ASSESSMENT/PLAN OF CARE 53 y.o. male with PMHx significant for  T2DM, HTN, HLD, tobacco use, PAD s/p recent stenting to right SFA and right CIA (12/09/2023)  with gangrenous changes to the right first and fifth toes concern for superimposed cellulitis    -As patient is fully revascularized and has shown demarcation level we proceed with amputation of the first and fifth toe at this time given concern for soft tissue infection.  Patient is willing to proceed n.p.o. past midnight 4 OR tomorrow morning 730 right first and fifth  toe amputation at MPJ level. -Appreciate vascular surgery fully revascularized from their perspective - Continue IV abx broad spectrum until surgery and then we can transition to p.o. for 7 days - Anticoagulation: Hold pending the OR tomorrow and then resume following surgery - Wound care: None needed preoperatively - WB status: Will be weightbearing as tolerated in a postop shoe postop - Will continue to follow   Thank you for the consult.  Please contact me directly with any questions or concerns.           Corinna Gab, DPM [facility] / [facility]    [address] [place], [place] [address]                Office [phone]  Fax [phone]

## 2023-12-23 NOTE — Progress Notes (Signed)
 Progress Note   Patient: Mark Phillips [id number]:[id number] DOB: 02/05/1971 DOA: 12/22/2023     1 DOS: the patient was seen and examined on 12/23/2023   Brief hospital course: 53yo with h/o T2DM, HTN, HLD, tobacco use, PAD s/p recent stenting to right SFA and right CIA (12/09/2023) who presented on 3/17 with dry gangrene of right first and fifth toes.  Podiatry is consulted with likely need for surgical intervention.  Assessment and Plan:  Gangrene right first and fifth toes with superimposed cellulitis Seen by vascular surgery who recommended podiatry evaluation Podiatry is consulting, planning great and 5th toe amputations on 3/19 Continue IV vancomycin and Unasyn Follow blood cultures N.p.o. after midnight Antiplatelets on hold for now for potential surgery  PAD  s/p RLE angiogram with stenting to right SFA and right CIA on 12/09/2023  Vascular surgery consulted Has palpable R DP pulse, groin site is doing well Has f/u appt scheduled for 4/18 with arterial duplex, ABI, and PA f/u On DAPT at home, will hold   Type 2 diabetes Holding metformin Placed on SSI   Hypertension Not on antihypertensives as an outpatient BP potentially elevated due to pain, continue pain control Improved today to 123/79 Will not add medication at this time   Hyperlipidemia Continue atorvastatin  Tobacco use Patient reports smoking 1 PPD Smoking cessation is advised He declined nicotine patch on presentation but is now willing to try it    Consultants: Vascular surgery  Podiatry  Procedures: None  Antibiotics: Zosyn x 1 Unasyn 3/17- Vancomycin 3/17-  30 Day Unplanned Readmission Risk Score    Flowsheet Row ED to Hosp-Admission (Current) from 12/22/2023 in [facility]  30 Day Unplanned Readmission Risk Score (%) 9.15 Filed at 12/23/2023 0801       This score is the patient's risk of an unplanned readmission within 30 days of being discharged (0  -100%). The score is based on dignosis, age, lab data, medications, orders, and past utilization.   Low:  0-14.9   Medium: 15-21.9   High: 22-29.9   Extreme: 30 and above           Subjective: Feeling ok, ok with plan for amputation.  Pain is mostly controlled.   Objective: Vitals:   12/23/23 0605 12/23/23 0745  BP: (!) 137/91 129/86  Pulse: 71 78  Resp: 16 14  Temp: 98.3 F (36.8 C) 98.8 F (37.1 C)  SpO2: 98% 98%    Intake/Output Summary (Last 24 hours) at 12/23/2023 9562 Last data filed at 12/22/2023 2006 Gross per 24 hour  Intake 120 ml  Output --  Net 120 ml   Filed Weights   12/22/23 1246  Weight: 74.8 kg    Exam:  General:  Appears calm and comfortable and is in NAD, smells of cigarettes Eyes:  EOMI, normal lids, iris ENT:  grossly normal hearing, lips & tongue, mmm Cardiovascular:  RRR, no m/r/g. No LE edema.  Respiratory:   CTA bilaterally with no wheezes/rales/rhonchi.  Normal respiratory effort. Abdomen:  soft, NT, ND Skin:  R great toe and 5th toe gangrene     Musculoskeletal:  grossly normal tone BUE/BLE, good ROM, no bony abnormality, 2+ distal pulses Psychiatric:  grossly normal mood and affect, speech fluent and appropriate, AOx3 Neurologic:  CN 2-12 grossly intact, moves all extremities in coordinated fashion  Data Reviewed: I have reviewed the patient's lab results since admission.  Pertinent labs for today include:   Glucose 112 WBC 14.2 Hgb  12.2   Family Communication: None present  Disposition: Status is: Inpatient Remains inpatient appropriate because: needs surgical management     Time spent: 50 minutes  Unresulted Labs (From admission, onward)     Start     Ordered   12/23/23 0500  Basic metabolic panel  Tomorrow morning,   R        12/22/23 2005   12/23/23 0500  CBC  Tomorrow morning,   R        12/22/23 2005   12/23/23 0356  Surgical pcr screen  Once,   R        12/23/23 0355   12/22/23 1623  Blood culture  (routine x 2)  BLOOD CULTURE X 2,   R      12/22/23 1623             Author: Jonah Blue, MD 12/23/2023 8:07 AM  For on call review [email or web address].

## 2023-12-24 ENCOUNTER — Encounter (HOSPITAL_COMMUNITY): Payer: Self-pay | Admitting: Internal Medicine

## 2023-12-24 ENCOUNTER — Inpatient Hospital Stay (HOSPITAL_COMMUNITY): Payer: Self-pay | Admitting: Certified Registered"

## 2023-12-24 ENCOUNTER — Encounter (HOSPITAL_COMMUNITY)
Admission: EM | Disposition: A | Discharge status: Home / Self Care | Payer: Self-pay | Source: Home / Self Care | Attending: Internal Medicine

## 2023-12-24 ENCOUNTER — Other Ambulatory Visit: Payer: Self-pay

## 2023-12-24 ENCOUNTER — Inpatient Hospital Stay (HOSPITAL_COMMUNITY): Payer: Self-pay

## 2023-12-24 DIAGNOSIS — I96 Gangrene, not elsewhere classified: Secondary | ICD-10-CM

## 2023-12-24 DIAGNOSIS — I10 Essential (primary) hypertension: Secondary | ICD-10-CM

## 2023-12-24 DIAGNOSIS — E119 Type 2 diabetes mellitus without complications: Secondary | ICD-10-CM

## 2023-12-24 DIAGNOSIS — G4733 Obstructive sleep apnea (adult) (pediatric): Secondary | ICD-10-CM

## 2023-12-24 HISTORY — PX: AMPUTATION TOE: SHX6595

## 2023-12-24 LAB — GLUCOSE, CAPILLARY
Glucose-Capillary: 137 mg/dL — ABNORMAL HIGH (ref 70–99)
Glucose-Capillary: 127 mg/dL — ABNORMAL HIGH (ref 70–99)
Glucose-Capillary: 119 mg/dL — ABNORMAL HIGH (ref 70–99)
Glucose-Capillary: 122 mg/dL — ABNORMAL HIGH (ref 70–99)
Glucose-Capillary: 113 mg/dL — ABNORMAL HIGH (ref 70–99)
Glucose-Capillary: 111 mg/dL — ABNORMAL HIGH (ref 70–99)
Glucose-Capillary: 169 mg/dL — ABNORMAL HIGH (ref 70–99)

## 2023-12-24 SURGERY — AMPUTATION, TOE
Anesthesia: General | Site: Toe | Laterality: Right

## 2023-12-24 MED ORDER — PROPOFOL 10 MG/ML IV BOLUS
INTRAVENOUS | Status: DC | PRN
  Administered 2023-12-24: 150 mg via INTRAVENOUS

## 2023-12-24 MED ORDER — ONDANSETRON HCL 4 MG/2ML IJ SOLN
INTRAMUSCULAR | Status: AC
  Filled 2023-12-24: qty 2

## 2023-12-24 MED ORDER — DEXAMETHASONE SODIUM PHOSPHATE 10 MG/ML IJ SOLN
INTRAMUSCULAR | Status: AC
  Filled 2023-12-24: qty 1

## 2023-12-24 MED ORDER — PHENYLEPHRINE 80 MCG/ML (10ML) SYRINGE FOR IV PUSH (FOR BLOOD PRESSURE SUPPORT)
PREFILLED_SYRINGE | INTRAVENOUS | Status: AC
  Filled 2023-12-24: qty 10

## 2023-12-24 MED ORDER — LIDOCAINE HCL (PF) 1 % IJ SOLN
INTRAMUSCULAR | Status: DC | PRN
  Administered 2023-12-24: 10 mL

## 2023-12-24 MED ORDER — PHENYLEPHRINE 80 MCG/ML (10ML) SYRINGE FOR IV PUSH (FOR BLOOD PRESSURE SUPPORT)
PREFILLED_SYRINGE | INTRAVENOUS | Status: DC | PRN
Start: 2023-12-24 — End: 2023-12-24
  Administered 2023-12-24 (×2): 80 ug via INTRAVENOUS
  Administered 2023-12-24: 160 ug via INTRAVENOUS

## 2023-12-24 MED ORDER — BUPIVACAINE HCL (PF) 0.5 % IJ SOLN
INTRAMUSCULAR | Status: AC
  Filled 2023-12-24: qty 30

## 2023-12-24 MED ORDER — OXYCODONE HCL 5 MG PO TABS: 5.0000 mg | ORAL_TABLET | Freq: Once | ORAL | Status: DC | PRN

## 2023-12-24 MED ORDER — ONDANSETRON HCL 4 MG/2ML IJ SOLN
INTRAMUSCULAR | Status: DC | PRN
  Administered 2023-12-24: 4 mg via INTRAVENOUS

## 2023-12-24 MED ORDER — LIDOCAINE HCL 1 % IJ SOLN
INTRAMUSCULAR | Status: AC
  Filled 2023-12-24: qty 20

## 2023-12-24 MED ORDER — ONDANSETRON HCL 4 MG/2ML IJ SOLN: 4.0000 mg | Freq: Once | INTRAMUSCULAR | Status: DC | PRN

## 2023-12-24 MED ORDER — MORPHINE SULFATE (PF) 2 MG/ML IV SOLN
2.0000 mg | INTRAVENOUS | Status: AC | PRN
  Administered 2023-12-24 – 2023-12-25 (×3): 2 mg via INTRAVENOUS
  Filled 2023-12-24 (×3): qty 1

## 2023-12-24 MED ORDER — ORAL CARE MOUTH RINSE: 15.0000 mL | Freq: Once | OROMUCOSAL | Status: AC

## 2023-12-24 MED ORDER — CHLORHEXIDINE GLUCONATE 0.12 % MT SOLN
OROMUCOSAL | Status: AC
  Administered 2023-12-24: 15 mL via OROMUCOSAL
  Filled 2023-12-24: qty 15

## 2023-12-24 MED ORDER — 0.9 % SODIUM CHLORIDE (POUR BTL) OPTIME
TOPICAL | Status: DC | PRN
Start: 2023-12-24 — End: 2023-12-24
  Administered 2023-12-24: 1000 mL

## 2023-12-24 MED ORDER — LIDOCAINE 2% (20 MG/ML) 5 ML SYRINGE
INTRAMUSCULAR | Status: DC | PRN
  Administered 2023-12-24: 100 mg via INTRAVENOUS

## 2023-12-24 MED ORDER — ACETAMINOPHEN 10 MG/ML IV SOLN
INTRAVENOUS | Status: AC
Start: 2023-12-24 — End: 2023-12-24
  Filled 2023-12-24: qty 100

## 2023-12-24 MED ORDER — PROPOFOL 10 MG/ML IV BOLUS
INTRAVENOUS | Status: AC
  Filled 2023-12-24: qty 20

## 2023-12-24 MED ORDER — FENTANYL CITRATE (PF) 100 MCG/2ML IJ SOLN: 25.0000 ug | INTRAMUSCULAR | Status: DC | PRN

## 2023-12-24 MED ORDER — ACETAMINOPHEN 325 MG PO TABS: 325.0000 mg | ORAL_TABLET | ORAL | Status: DC | PRN

## 2023-12-24 MED ORDER — ACETAMINOPHEN 10 MG/ML IV SOLN
INTRAVENOUS | Status: DC | PRN
Start: 2023-12-24 — End: 2023-12-24
  Administered 2023-12-24: 1000 mg via INTRAVENOUS

## 2023-12-24 MED ORDER — MEPERIDINE HCL 25 MG/ML IJ SOLN: 6.2500 mg | INTRAMUSCULAR | Status: DC | PRN

## 2023-12-24 MED ORDER — ACETAMINOPHEN 160 MG/5ML PO SOLN: 325.0000 mg | ORAL | Status: DC | PRN

## 2023-12-24 MED ORDER — OXYCODONE HCL 5 MG/5ML PO SOLN: 5.0000 mg | Freq: Once | ORAL | Status: DC | PRN

## 2023-12-24 MED ORDER — LIDOCAINE 2% (20 MG/ML) 5 ML SYRINGE
INTRAMUSCULAR | Status: AC
  Filled 2023-12-24: qty 5

## 2023-12-24 MED ORDER — CHLORHEXIDINE GLUCONATE 0.12 % MT SOLN: 15.0000 mL | Freq: Once | OROMUCOSAL | Status: AC

## 2023-12-24 MED ORDER — MIDAZOLAM HCL 2 MG/2ML IJ SOLN
INTRAMUSCULAR | Status: AC
  Filled 2023-12-24: qty 2

## 2023-12-24 MED ORDER — FENTANYL CITRATE (PF) 250 MCG/5ML IJ SOLN
INTRAMUSCULAR | Status: AC
  Filled 2023-12-24: qty 5

## 2023-12-24 MED ORDER — LACTATED RINGERS IV SOLN: INTRAVENOUS | Status: DC

## 2023-12-24 MED ORDER — BUPIVACAINE HCL (PF) 0.5 % IJ SOLN
INTRAMUSCULAR | Status: DC | PRN
  Administered 2023-12-24: 10 mL

## 2023-12-24 MED ORDER — EPHEDRINE SULFATE (PRESSORS) 50 MG/ML IJ SOLN
INTRAMUSCULAR | Status: DC | PRN
  Administered 2023-12-24: 5 mg via INTRAVENOUS

## 2023-12-24 MED ORDER — FENTANYL CITRATE (PF) 100 MCG/2ML IJ SOLN
INTRAMUSCULAR | Status: DC | PRN
  Administered 2023-12-24: 50 ug via INTRAVENOUS

## 2023-12-24 MED ORDER — PROPOFOL 1000 MG/100ML IV EMUL
INTRAVENOUS | Status: AC
  Filled 2023-12-24: qty 100

## 2023-12-24 MED ORDER — EPHEDRINE 5 MG/ML INJ
INTRAVENOUS | Status: AC
  Filled 2023-12-24: qty 5

## 2023-12-24 SURGICAL SUPPLY — 37 items
BLADE OSC/SAGITTAL MD 5.5X18 (BLADE) IMPLANT
BLADE SURG 10 STRL SS (BLADE) ×1 IMPLANT
BLADE SURG 15 STRL LF DISP TIS (BLADE) ×1 IMPLANT
BNDG COHESIVE 3X5 TAN ST LF (GAUZE/BANDAGES/DRESSINGS) ×1 IMPLANT
BNDG ELASTIC 3INX 5YD STR LF (GAUZE/BANDAGES/DRESSINGS) ×1 IMPLANT
BNDG ELASTIC 4INX 5YD STR LF (GAUZE/BANDAGES/DRESSINGS) IMPLANT
BNDG ELASTIC 6X15 VLCR STRL LF (GAUZE/BANDAGES/DRESSINGS) IMPLANT
BNDG ESMARK 4X9 LF (GAUZE/BANDAGES/DRESSINGS) ×1 IMPLANT
BNDG GAUZE DERMACEA FLUFF 4 (GAUZE/BANDAGES/DRESSINGS) IMPLANT
CHLORAPREP W/TINT 26 (MISCELLANEOUS) IMPLANT
DRSG ADAPTIC 3X8 NADH LF (GAUZE/BANDAGES/DRESSINGS) IMPLANT
ELECT REM PT RETURN 9FT ADLT (ELECTROSURGICAL) ×1 IMPLANT
ELECTRODE REM PT RTRN 9FT ADLT (ELECTROSURGICAL) ×1 IMPLANT
GAUZE PAD ABD 8X10 STRL (GAUZE/BANDAGES/DRESSINGS) IMPLANT
GAUZE SPONGE 2X2 STRL 8-PLY (GAUZE/BANDAGES/DRESSINGS) IMPLANT
GAUZE SPONGE 4X4 12PLY STRL (GAUZE/BANDAGES/DRESSINGS) ×1 IMPLANT
GAUZE STRETCH 2X75IN STRL (MISCELLANEOUS) ×1 IMPLANT
GAUZE XEROFORM 1X8 LF (GAUZE/BANDAGES/DRESSINGS) ×1 IMPLANT
GLOVE BIO SURGEON STRL SZ7.5 (GLOVE) ×1 IMPLANT
GLOVE BIOGEL PI IND STRL 7.5 (GLOVE) ×1 IMPLANT
GOWN STRL REUS W/ TWL LRG LVL3 (GOWN DISPOSABLE) ×2 IMPLANT
KIT BASIN OR (CUSTOM PROCEDURE TRAY) ×1 IMPLANT
NDL BIOPSY JAMSHIDI 8X6 (NEEDLE) IMPLANT
NDL HYPO 25X1 1.5 SAFETY (NEEDLE) ×1 IMPLANT
NEEDLE BIOPSY JAMSHIDI 8X6 (NEEDLE) IMPLANT
NEEDLE HYPO 25X1 1.5 SAFETY (NEEDLE) ×2 IMPLANT
PACK ORTHO EXTREMITY (CUSTOM PROCEDURE TRAY) ×1 IMPLANT
PADDING CAST ABS COTTON 4X4 ST (CAST SUPPLIES) ×2 IMPLANT
SET HNDPC FAN SPRY TIP SCT (DISPOSABLE) IMPLANT
SPIKE FLUID TRANSFER (MISCELLANEOUS) IMPLANT
STOCKINETTE 4X48 STRL (DRAPES) IMPLANT
SUT ETHILON 3 0 FSLX (SUTURE) IMPLANT
SUT PROLENE 3 0 PS 2 (SUTURE) IMPLANT
SUT PROLENE 4 0 PS 2 18 (SUTURE) IMPLANT
SYR CONTROL 10ML LL (SYRINGE) ×1 IMPLANT
UNDERPAD 30X36 HEAVY ABSORB (UNDERPADS AND DIAPERS) ×1 IMPLANT
WATER STERILE IRR 1000ML POUR (IV SOLUTION) ×1 IMPLANT

## 2023-12-24 NOTE — Progress Notes (Signed)
 Orthopedic Tech Progress Note Patient Details:  Mark Phillips 1971/03/24 [id number]  Ortho Devices Type of Ortho Device: Postop shoe/boot Ortho Device/Splint Location: RLE Ortho Device/Splint Interventions: Ordered, Application, Adjustment, Removal   Post Interventions Patient Tolerated: Well Instructions Provided: Care of device  Donald Pore 12/24/2023, 9:43 AM

## 2023-12-24 NOTE — Progress Notes (Signed)
 History and Physical Interval Note:  12/24/2023 8:18 AM  Mark Phillips  has presented today for surgery, with the diagnosis of gangrene of first and fifth toe right foot.  The various methods of treatment have been discussed with the patient and family. After consideration of risks, benefits and other options for treatment, the patient has consented to   Procedure(s) with comments: AMPUTATION, TOE (Right) - Right hallux and 5th toe amp as a surgical intervention.  The patient's history has been reviewed, patient examined, no change in status, stable for surgery.  I have reviewed the patient's chart and labs.  Questions were answered to the patient's satisfaction.     Jenelle Mages Ewart Carrera

## 2023-12-24 NOTE — Anesthesia Procedure Notes (Signed)
 Procedure Name: LMA Insertion Date/Time: 12/24/2023 8:35 AM  Performed by: Alwyn Ren, CRNAPre-anesthesia Checklist: Patient identified, Timeout performed, Emergency Drugs available, Suction available and Patient being monitored Patient Re-evaluated:Patient Re-evaluated prior to induction Oxygen Delivery Method: Circle system utilized Preoxygenation: Pre-oxygenation with 100% oxygen Induction Type: IV induction LMA Size: 4.0 Number of attempts: 1 Tube secured with: Tape

## 2023-12-24 NOTE — Anesthesia Postprocedure Evaluation (Signed)
 Anesthesia Post Note  Patient: Jalyn Rosero  Procedure(s) Performed: AMPUTATION, TOE (Right: Toe)     Patient location during evaluation: PACU Anesthesia Type: General Level of consciousness: awake and alert Pain management: pain level controlled Vital Signs Assessment: post-procedure vital signs reviewed and stable Respiratory status: spontaneous breathing, nonlabored ventilation, respiratory function stable and patient connected to nasal cannula oxygen Cardiovascular status: blood pressure returned to baseline and stable Postop Assessment: no apparent nausea or vomiting Anesthetic complications: no   No notable events documented.  Last Vitals:  Vitals:   12/24/23 1007 12/24/23 1500  BP: 128/78 138/85  Pulse: 72 82  Resp: 16 16  Temp: 36.6 C 36.6 C  SpO2: 96% 95%    Last Pain:  Vitals:   12/24/23 1500  TempSrc: Oral  PainSc:                  Jessilynn Taft

## 2023-12-24 NOTE — Plan of Care (Signed)
  Problem: Education: Goal: Knowledge of General Education information will improve Description: Including pain rating scale, medication(s)/side effects and non-pharmacologic comfort measures Outcome: Progressing   Problem: Clinical Measurements: Goal: Will remain free from infection Outcome: Progressing   Problem: Nutrition: Goal: Adequate nutrition will be maintained Outcome: Progressing   Problem: Pain Managment: Goal: General experience of comfort will improve and/or be controlled Outcome: Progressing

## 2023-12-24 NOTE — Op Note (Signed)
 Full Operative Report  Date of Operation: 9:03 AM, 12/24/2023   Patient: Mark Phillips - 53 y.o. male  Surgeon: Pilar Plate, DPM   Assistant: None  Diagnosis: Gangrene of first and fifth toe, right foot  Procedure:  1. Amputation of great toe at MPJ level, right foot 2. Amputation of fifth toe at MPJ level, right foot    Anesthesia: Louie Casa, MD  Anesthesiologist: Bethena Midget, MD CRNA: Alwyn Ren, CRNA   Estimated Blood Loss: Minimal   Hemostasis: 1) Anatomical dissection, mechanical compression, electrocautery 2) No tourniquet was used during the procedure  Implants: * No implants in log *  Materials: Prolene  3-0  Injectables: 1) Pre-operatively: 20 cc of 50:50 mixture 1%lidocaine plain and 0.5% marcaine plain 2) Post-operatively: None   Specimens: Pathology: first and fifth toe  Microbiology: First toe tissue culture   Antibiotics: IV antibiotics given per schedule on the floor  Drains: None  Complications: Patient tolerated the procedure well without complication.   Operative findings: As below in detailed report  Indications for Procedure: Mark Phillips presents to Pilar Plate, DPM with a chief complaint of gangrene of the first and fifth toe right foot The patient has failed conservative treatments of various modalities. At this time the patient has elected to proceed with surgical correction. All alternatives, risks, and complications of the procedures were thoroughly explained to the patient. Patient exhibits appropriate understanding of all discussion points and informed consent was signed and obtained in the chart with no guarantees to surgical outcome given or implied.  Description of Procedure: Patient was brought to the operating room. Patient remained on their hospital bed in the supine position. A surgical timeout was performed and all members of the operating room, the procedure, and the  surgical site were identified. anesthesia occurred as per anesthesia record. Local anesthetic as previously described was then injected about the operative field in a local infiltrative block.  The operative lower extremity as noted above was then prepped and draped in the usual sterile manner. The following procedure then began.  Attention was directed to the first digit on the right foot. A full-thickness incision encompassing the entire digit was made using a #15 blade. Dissection was carried down to bone. The toe was secured with a towel clamp, further dissected in its entirety, and disarticulated at the MPJ and passed to the back table as a gross specimen. This was then labled and sent to pathology. The bone was noted to be soft and eroded, and consistent with osteomyelitis. All remaining necrotic and devitalized soft tissue structures were visualized and dissected away using sharp and dull dissection. Care was taken to protect all neurovascular structures throughout the dissection. All bleeders were cauterized as necessary. A deep tissue culture was obtained at this time. The area was then flushed with copious amounts of sterile saline. Then using the suture materials previously described, the site was closed in anatomic layers and the skin was well approximated under minimal tension.  Attention was directed to the 5th digit on the right foot. A full-thickness incision encompassing the entire digit was made using a #15 blade. Dissection was carried down to bone. The toe was secured with a towel clamp, further dissected in its entirety, and disarticulated at the MPJ and passed to the back table as a gross specimen. This was then labled and sent to pathology. The bone was noted to be soft and eroded, and consistent with osteomyelitis. All remaining necrotic and  devitalized soft tissue structures were visualized and dissected away using sharp and dull dissection. Care was taken to protect all neurovascular  structures throughout the dissection. All bleeders were cauterized as necessary.  The area was then flushed with copious amounts of sterile saline. Then using the suture materials previously described, the site was closed in anatomic layers and the skin was well approximated under minimal tension.   The surgical site was then dressed with xerform 4x4 kerlix and ace wrap. The patient tolerated both the procedure and anesthesia well with vital signs stable throughout. The patient was transferred in good condition and all vital signs stable  from the OR to recovery under the discretion of anesthesia.  Condition: Vital signs stable, neurovascular status unchanged from preoperative   Surgical plan:  WBAT in post op shoe. 7 days cephalexin TID on discharge. Stable for DC home later today or tomorrow. Follow up in clinic next week Tuesday or Thursday.  The patient will be WBAT in a post op shoe to the operative limb until further instructed. The dressing is to remain clean, dry, and intact. Will continue to follow unless noted elsewhere.   Carlena Hurl, DPM [facility]

## 2023-12-24 NOTE — Progress Notes (Signed)
 [facility] Progress Note Patient: Mark Phillips [id number]:[id number] DOB: 1970-11-21 DOA: 12/22/2023  DOS: the patient was seen and examined on 12/24/2023  Brief Hospital Course: 53yo with h/o T2DM, HTN, HLD, tobacco use, PAD s/p recent stenting to right SFA and right CIA (12/09/2023) who presented on 3/17 with dry gangrene of right first and fifth toes.  Podiatry is consulted, plan for surgical intervention on 3/19.  Assessment plan.  Right first and fifth toe gangrene. Underwent amputation. Management per podiatry. Currently feels confident that the patient can be switched to oral antibiotics. Will monitor cultures.  PAD. History of right lower leg angiogram with stenting to right SFA and right CIA on 3/4. Appreciate vascular surgery consultation. Resume DAPT.  Type 2 diabetes mellitus. Holding oral hypoglycemic agent. On sliding scale insulin.  HTN. Blood pressure count is stable for now. Monitor.  HLD. Continuing statin.  Active smoker. Recommend to stop smoking especially in the setting of PAD. Not ready right now.  Subjective: Pain uncontrolled.  No nausea no vomiting no fever no chills.  Physical Exam: General: in Mild distress, No Rash Cardiovascular: S1 and S2 Present, No Murmur Respiratory: Good respiratory effort, Bilateral Air entry present. No Crackles, No wheezes Abdomen: Bowel Sound present, No tenderness Extremities: No edema Neuro: Alert and oriented x3, no new focal deficit  Data Reviewed: I have Reviewed nursing notes, Vitals, and Lab results. Since last encounter, pertinent lab results CBC and BMP   . I have ordered test including CBC and BMP  .   Disposition: Status is: Inpatient Remains inpatient appropriate because: Monitor for improvement in pain control.  SCDs Start: 12/22/23 2004   Family Communication: No one at bedside Level of care: Med-Surg   Vitals:   12/24/23 0930 12/24/23 0940 12/24/23 1007 12/24/23 1500  BP: 121/84  128/78  138/85  Pulse: 75  72 82  Resp: 16  16 16   Temp:  97.7 F (36.5 C) 97.9 F (36.6 C) 97.9 F (36.6 C)  TempSrc:   Axillary Oral  SpO2: 93%  96% 95%  Weight:      Height:         Author: Lynden Oxford, MD 12/24/2023 6:41 PM  Please look on [email or web address] to find out who is on call.

## 2023-12-24 NOTE — Transfer of Care (Signed)
 Immediate Anesthesia Transfer of Care Note  Patient: Mark Phillips  Procedure(s) Performed: AMPUTATION, TOE (Right: Toe)  Patient Location: PACU  Anesthesia Type:General  Level of Consciousness: awake, alert , and oriented  Airway & Oxygen Therapy: Patient Spontanous Breathing  Post-op Assessment: Report given to RN and Post -op Vital signs reviewed and stable  Post vital signs: Reviewed and stable  Last Vitals:  Vitals Value Taken Time  BP 115/73 12/24/23 0908  Temp    Pulse 84 12/24/23 0911  Resp 14 12/24/23 0911  SpO2 92 % 12/24/23 0911  Vitals shown include unfiled device data.  Last Pain:  Vitals:   12/24/23 0744  TempSrc: Oral  PainSc: 3          Complications: No notable events documented.

## 2023-12-25 ENCOUNTER — Encounter (HOSPITAL_COMMUNITY): Payer: Self-pay | Admitting: Podiatry

## 2023-12-25 LAB — BASIC METABOLIC PANEL
Anion gap: 8 (ref 5–15)
BUN: 9 mg/dL (ref 6–20)
CO2: 26 mmol/L (ref 22–32)
Calcium: 8.9 mg/dL (ref 8.9–10.3)
Chloride: 99 mmol/L (ref 98–111)
Creatinine, Ser: 0.7 mg/dL (ref 0.61–1.24)
GFR, Estimated: >60 mL/min (ref >60)
Glucose, Bld: 111 mg/dL — ABNORMAL HIGH (ref 70–99)
Potassium: 3.9 mmol/L (ref 3.5–5.1)
Sodium: 133 mmol/L — ABNORMAL LOW (ref 135–145)

## 2023-12-25 LAB — CBC
HCT: 36.8 % — ABNORMAL LOW (ref 39.0–52.0)
Hemoglobin: 12.4 g/dL — ABNORMAL LOW (ref 13.0–17.0)
MCH: 29.2 pg (ref 26.0–34.0)
MCHC: 33.7 g/dL (ref 30.0–36.0)
MCV: 86.8 fL (ref 80.0–100.0)
Platelets: 377 10*3/uL (ref 150–400)
RBC: 4.24 MIL/uL (ref 4.22–5.81)
RDW: 13.1 % (ref 11.5–15.5)
WBC: 14.3 10*3/uL — ABNORMAL HIGH (ref 4.0–10.5)
nRBC: 0 % (ref 0.0–0.2)

## 2023-12-25 LAB — ACID FAST SMEAR (AFB, MYCOBACTERIA): Acid Fast Smear: NEGATIVE

## 2023-12-25 LAB — GLUCOSE, CAPILLARY
Glucose-Capillary: 103 mg/dL — ABNORMAL HIGH (ref 70–99)
Glucose-Capillary: 107 mg/dL — ABNORMAL HIGH (ref 70–99)
Glucose-Capillary: 150 mg/dL — ABNORMAL HIGH (ref 70–99)
Glucose-Capillary: 132 mg/dL — ABNORMAL HIGH (ref 70–99)

## 2023-12-25 MED ORDER — GLUCERNA SHAKE PO LIQD
237.0000 mL | Freq: Two times a day (BID) | ORAL | Status: DC
  Administered 2023-12-25 – 2023-12-26 (×2): 237 mL via ORAL

## 2023-12-25 MED ORDER — ENOXAPARIN SODIUM 40 MG/0.4ML IJ SOSY
40.0000 mg | PREFILLED_SYRINGE | Freq: Every day | INTRAMUSCULAR | Status: DC
  Administered 2023-12-25 – 2023-12-26 (×2): 40 mg via SUBCUTANEOUS
  Filled 2023-12-25 (×2): qty 0.4

## 2023-12-25 NOTE — Progress Notes (Addendum)
 Readmits with gangrene of first and fifth toe, right foot. Hx of r diabetes mellitus, hypertension, tobacco use.     -s/p  amputation of great toe at MPJ level, right foot and  amputation of fifth toe at MPJ level, right foot, 3/19   12/25/23 1128  TOC Brief Assessment  Insurance and Status Reviewed (Pt without insurance . Referral made with [facility] with previos admit 2 weeks ago)  Home environment has been reviewed From home with wife  Prior level of function: PTA independent  Prior/Current Home Services No current home services  Social Drivers of Health Review SDOH reviewed no interventions necessary  Readmission risk has been reviewed Yes  Transition of care needs no transition of care needs at this time   Pt without transportation issues. Pt with boot for R foot. Post hospital f/u noted on AVS.  Per MD D/c plan tomorrow , cultures pending  TOC following for needs.... Gae Gallop RN,BSN,CM

## 2023-12-25 NOTE — Progress Notes (Signed)
  Subjective:  Patient ID: Mark Phillips, male    DOB: 1971-09-15,  MRN: [id number]  Chief Complaint  Patient presents with   Circulatory Problem    DOS: 12/24/2023 Procedure: 1. Amputation of great toe at MPJ level, right foot 2. Amputation of fifth toe at MPJ level, right foot  53 y.o. male seen for post op check.  Patient reports he is doing well his pain is controlled at the time being.  He has postop shoe on has been doing a little walking without any issue.  Discussed findings from surgery as well as follow-up plans  Review of Systems: Negative except as noted in the HPI. Denies N/V/F/Ch.   Objective:   Vitals:   12/25/23 0417 12/25/23 0717  BP: 116/70 132/86  Pulse: 72 76  Resp: 18   Temp: 98.2 F (36.8 C) 98.1 F (36.7 C)  SpO2: 95% 98%   Body mass index is 23.39 kg/m. Constitutional Well developed. Well nourished.  Vascular Foot warm and well perfused. Capillary refill normal to all digits.   No calf pain with palpation  Neurologic Normal speech. Oriented to person, place, and time. Epicritic sensation intact to forefoot  Dermatologic Dressing clean dry and intact to right foot  Orthopedic: Status post first and fifth toe amputation   Radiographs: Disarticulation of first and fifth toe at MPJ level  Pathology: Pending  Micro: Rare gram-positive cocci in pairs  Assessment:   Gangrene of right foot first and fifth toe status post amputation  Plan:  Patient was evaluated and treated and all questions answered.  POD # 1 s/p right first and fifth toe amputation at MPJ level -Progressing as expected postoperatively -XR: Expected postoperative changes -WB Status: Weightbearing as tolerated in postop shoe  -Sutures: Remain intact. -Medications/ABX: Recommend short course of antibiotics Augmentin for 7 days upon discharge -Dressing to remain clean dry and intact until follow-up next week - Return to clinic next week Tuesday versus Thursday for  dressing change, office will call to arrange -Stable for discharge will sign off at this time        Corinna Gab, DPM [facility]

## 2023-12-25 NOTE — Progress Notes (Signed)
 [facility] Progress Note Patient: Mark Phillips QMV:[id number] DOB: Aug 18, 1971 DOA: 12/22/2023  DOS: the patient was seen and examined on 12/25/2023  Brief Hospital Course: 53yo with h/o T2DM, HTN, HLD, tobacco use, PAD s/p recent stenting to right SFA and right CIA (12/09/2023) who presented on 3/17 with dry gangrene of right first and fifth toes.  Podiatry is consulted, plan for surgical intervention on 3/19.  Assessment plan. Right first and fifth toe gangrene. Staph aureus infection. Underwent amputation. Management per podiatry. Currently feels confident that the patient can be switched to oral antibiotics.  As expected clean margins. Wound culture growing Staph aureus. For now I will continue vancomycin and Unasyn.  Switch to appropriate monotherapy once sensitivity report is available.   PAD. History of right lower leg angiogram with stenting to right SFA and right CIA on 3/4. Appreciate vascular surgery consultation. Resume DAPT.   Type 2 diabetes mellitus. Holding oral hypoglycemic agent. On sliding scale insulin.   HTN. Blood pressure count is stable for now. Monitor.   HLD. Continuing statin.   Active smoker. Recommend to stop smoking especially in the setting of PAD. Not ready right now.   Subjective: No nausea no vomiting.  Pain well-controlled.  No diarrhea.  Physical Exam: General: in Mild distress, No Rash Cardiovascular: S1 and S2 Present, No Murmur Respiratory: Good respiratory effort, Bilateral Air entry present. No Crackles, No wheezes Abdomen: Bowel Sound present, No tenderness Extremities: No edema Neuro: Alert and oriented x3, no new focal deficit  Data Reviewed: I have Reviewed nursing notes, Vitals, and Lab results. Since last encounter, pertinent lab results CBC and BMP   . I have ordered test including CBC and BMP  . I have discussed pt's care plan and test results with discussed with podiatry, expect clean margin for the surgery.  .    Disposition: Status is: Inpatient Remains inpatient appropriate because: Awaiting culture sensitivity.  enoxaparin (LOVENOX) injection 40 mg Start: 12/25/23 1415 SCDs Start: 12/22/23 2004   Family Communication: No one at bedside Level of care: Med-Surg   Vitals:   12/24/23 1950 12/25/23 0417 12/25/23 0717 12/25/23 1543  BP: 126/78 116/70 132/86 121/73  Pulse: 88 72 76 67  Resp: 18 18  16   Temp: (!) 97.3 F (36.3 C) 98.2 F (36.8 C) 98.1 F (36.7 C) 97.9 F (36.6 C)  TempSrc: Oral Oral Oral Oral  SpO2: 94% 95% 98% 97%  Weight:      Height:         Author: Lynden Oxford, MD 12/25/2023 6:11 PM  Please look on [email or web address] to find out who is on call.

## 2023-12-25 NOTE — Plan of Care (Signed)
  Problem: Education: Goal: Knowledge of General Education information will improve Description: Including pain rating scale, medication(s)/side effects and non-pharmacologic comfort measures Outcome: Progressing   Problem: Health Behavior/Discharge Planning: Goal: Ability to manage health-related needs will improve Outcome: Progressing   Problem: Clinical Measurements: Goal: Ability to maintain clinical measurements within normal limits will improve Outcome: Progressing   Problem: Activity: Goal: Risk for activity intolerance will decrease Outcome: Progressing   Problem: Pain Managment: Goal: General experience of comfort will improve and/or be controlled Outcome: Progressing   Problem: Safety: Goal: Ability to remain free from injury will improve Outcome: Progressing

## 2023-12-25 NOTE — Plan of Care (Signed)
  Problem: Education: Goal: Knowledge of General Education information will improve Description: Including pain rating scale, medication(s)/side effects and non-pharmacologic comfort measures Outcome: Progressing   Problem: Clinical Measurements: Goal: Will remain free from infection Outcome: Progressing   Problem: Pain Managment: Goal: General experience of comfort will improve and/or be controlled Outcome: Progressing

## 2023-12-26 LAB — FUNGAL STAIN REFLEX

## 2023-12-26 LAB — GLUCOSE, CAPILLARY
Glucose-Capillary: 93 mg/dL (ref 70–99)
Glucose-Capillary: 136 mg/dL — ABNORMAL HIGH (ref 70–99)

## 2023-12-26 LAB — FUNGUS STAIN

## 2023-12-26 LAB — SURGICAL PATHOLOGY

## 2023-12-26 MED ORDER — PROSOURCE PLUS PO LIQD: 30.0000 mL | Freq: Two times a day (BID) | ORAL | 0 refills | Status: DC

## 2023-12-26 MED ORDER — ASCORBIC ACID 500 MG PO TABS: 500.0000 mg | ORAL_TABLET | Freq: Every day | ORAL | 0 refills | Status: DC

## 2023-12-26 MED ORDER — HYDROCODONE-ACETAMINOPHEN 5-325 MG PO TABS: 1.0000 | ORAL_TABLET | Freq: Four times a day (QID) | ORAL | 0 refills | Status: DC | PRN

## 2023-12-26 MED ORDER — NICOTINE 14 MG/24HR TD PT24: 14.0000 mg | MEDICATED_PATCH | Freq: Every day | TRANSDERMAL | 0 refills | Status: DC

## 2023-12-26 MED ORDER — JUVEN PO PACK: 1.0000 | PACK | Freq: Two times a day (BID) | ORAL | 0 refills | Status: DC

## 2023-12-26 MED ORDER — VITAMIN B-1 100 MG PO TABS: 100.0000 mg | ORAL_TABLET | Freq: Every day | ORAL | 0 refills | Status: DC

## 2023-12-26 MED ORDER — AMOXICILLIN-POT CLAVULANATE 875-125 MG PO TABS
1.0000 | ORAL_TABLET | Freq: Two times a day (BID) | ORAL | Status: DC
  Administered 2023-12-26: 1 via ORAL
  Filled 2023-12-26: qty 1

## 2023-12-26 MED ORDER — AMOXICILLIN-POT CLAVULANATE 875-125 MG PO TABS: 1.0000 | ORAL_TABLET | Freq: Two times a day (BID) | ORAL | 0 refills | Status: AC

## 2023-12-26 MED ORDER — ADULT MULTIVITAMIN W/MINERALS CH: 1.0000 | ORAL_TABLET | Freq: Every day | ORAL | 0 refills | Status: DC

## 2023-12-26 MED ORDER — ZINC SULFATE 220 (50 ZN) MG PO CAPS: 220.0000 mg | ORAL_CAPSULE | Freq: Every day | ORAL | 0 refills | Status: DC

## 2023-12-26 NOTE — Plan of Care (Signed)
  Problem: Metabolic: Goal: Ability to maintain appropriate glucose levels will improve Outcome: Progressing   Problem: Skin Integrity: Goal: Risk for impaired skin integrity will decrease Outcome: Progressing   Problem: Activity: Goal: Risk for activity intolerance will decrease Outcome: Progressing

## 2023-12-27 LAB — CULTURE, BLOOD (ROUTINE X 2)
Culture: NO GROWTH
Culture: NO GROWTH
Special Requests: ADEQUATE

## 2023-12-27 NOTE — Discharge Summary (Signed)
 Physician Discharge Summary   Patient: Mark Phillips MRN: [id number] DOB: September 28, 1971  Admit date:     12/22/2023  Discharge date: 12/26/2023  Discharge Physician: Lynden Oxford  PCP: [facility]  Recommendations at discharge: Follow-up with PCP. Follow-up with podiatry as recommended.   Follow-up Information     [facility] Follow up.   Contact information: [address] [place] [address] [phone]         [facility] Follow up.   Why: Return to clinic next week Tuesday versus Thursday for dressing change, office will call to arrange Contact information: [address]  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  Podiatry is consulted, plan for surgical intervention on 3/19.  Assessment plan. Right first and fifth toe gangrene. Staph aureus infection. Underwent amputation. Management per podiatry. Currently feels confident that the patient can be switched to oral antibiotics.  As expected clean margins. Wound culture growing Staph aureus.  Methicillin sensitive. Initially was on IV vancomycin and Unasyn. Switch to Augmentin.   PAD. History of right lower leg angiogram with stenting to right SFA and right CIA on 3/4. Appreciate vascular surgery consultation. Resume DAPT.   Type 2  diabetes mellitus.  Without long-term insulin use.  With HLD. Resuming home regimen.   HTN. Blood pressure is stable for now. Monitor.   HLD. Continuing statin.   Active smoker. Recommend to stop smoking especially in the setting of PAD. Not ready right now.    Pain control - [employer] Controlled Substance Reporting System database was reviewed. and patient was instructed, not to drive, operate heavy machinery, perform activities at heights, swimming or participation in water activities or provide baby-sitting services while on Pain, Sleep and Anxiety Medications; until their outpatient Physician has advised to do so again. Also recommended to not to take more than prescribed Pain, Sleep and Anxiety Medications.  Consultants:  Podiatry  Procedures performed:  Amputation of toe  DISCHARGE MEDICATION: Allergies as of 12/26/2023       Reactions   Ventolin [albuterol] Swelling   Immediate lip swelling        Medication List     TAKE these medications    (feeding supplement) PROSource Plus liquid Take 30 mLs by mouth 2 (two) times daily between meals.   nutrition supplement (JUVEN) Pack Take 1 packet by mouth 2 (two) times daily between meals.   amoxicillin-clavulanate 875-125 MG tablet Commonly known as: AUGMENTIN Take 1 tablet by mouth 2 (two) times daily for 5 days.   ascorbic acid 500 MG tablet Commonly known as: VITAMIN C Take 1 tablet (500 mg total) by mouth daily.   aspirin EC 81 MG tablet Take 1 tablet (81 mg total) by mouth daily. Swallow whole.   atorvastatin  40 MG tablet Commonly known as: LIPITOR Take 1 tablet (40 mg total) by mouth daily.   clopidogrel 75 MG tablet Commonly known as: PLAVIX Take 1 tablet (75 mg total) by mouth daily with breakfast.   gabapentin 100 MG capsule Commonly known as: NEURONTIN Take 100-200 mg by mouth every 6 (six) hours as needed (neuropathy).   HYDROcodone-acetaminophen 5-325 MG tablet Commonly known as:  NORCO/VICODIN Take 1 tablet by mouth every 6 (six) hours as needed for moderate pain (pain score 4-6) or severe pain (pain score 7-10).   ibuprofen 200 MG tablet Commonly known as: ADVIL Take 800 mg by mouth 2 (two) times daily as needed for headache or moderate pain (pain score 4-6).   metFORMIN 1000 MG tablet Commonly known as: GLUCOPHAGE TAKE 1 Tablet  BY MOUTH TWICE DAILY WITH A MEAL What changed:  when to take this reasons to take this   multivitamin with minerals Tabs tablet Take 1 tablet by mouth daily.   nicotine 14 mg/24hr patch Commonly known as: NICODERM CQ - dosed in mg/24 hours Place 1 patch (14 mg total) onto the skin daily.   thiamine 100 MG tablet Commonly known as: Vitamin B-1 Take 1 tablet (100 mg total) by mouth daily.   zinc sulfate (50mg  elemental zinc) 220 (50 Zn) MG capsule Take 1 capsule (220 mg total) by mouth daily.       Disposition: Home Diet recommendation: Carb modified diet  Discharge Exam: Vitals:   12/25/23 1543 12/25/23 2101 12/26/23 0543 12/26/23 0803  BP: 121/73 128/86 123/81 122/76  Pulse: 67 77 79 81  Resp: 16 20 20 18   Temp: 97.9 F (36.6 C) 98.3 F (36.8 C) 98.3 F (36.8 C) 98.5 F (36.9 C)  TempSrc: Oral  Oral   SpO2: 97% 99% 95% 95%  Weight:      Height:       General: Appear in mild distress; no visible Abnormal Neck Mass Or lumps, Conjunctiva normal Cardiovascular: S1 and S2 Present, no Murmur, Respiratory: good respiratory effort, Bilateral Air entry present and CTA, no Crackles, no wheezes Abdomen: Bowel Sound present, Non tender  Extremities: Right foot wrapped, no pedal edema Neurology: alert and oriented to time, place, and person  Filed Weights   12/22/23 1246 12/23/23 1100 12/24/23 0757  Weight: 74.8 kg 74 kg 73.9 kg   Condition at discharge: stable  The results of significant diagnostics from this hospitalization (including imaging, microbiology, ancillary and laboratory) are listed below for reference.    Imaging Studies: DG Foot 2 Views Right Result Date: 12/24/2023 CLINICAL DATA:  Status post amputation. EXAM: RIGHT FOOT - 2 VIEW COMPARISON:  Right foot x-rays dated December 12, 2023. FINDINGS: Interval amputation of the first and fifth toes with expected postsurgical changes in the soft tissues. No acute fracture or dislocation. Joint spaces are preserved. IMPRESSION: 1. Interval amputation of the first and fifth toes. Electronically Signed   By: Obie Dredge M.D.   On: 12/24/2023 13:14   DG Foot Complete Right Result Date: 12/22/2023 CLINICAL DATA:  wound, eval for deep space infection. EXAM: RIGHT FOOT COMPLETE - 3+ VIEW COMPARISON:  12/07/2023. FINDINGS: No acute fracture or dislocation. No aggressive osseous lesion. No significant arthritis of imaged joints. Calcaneal spur noted along the Achilles tendon and Plantar aponeurosis attachment sites. No focal soft tissue swelling. No focal soft tissue defect or air within the soft tissue. No radiopaque foreign bodies. IMPRESSION: No acute osseous abnormality of the right foot. Electronically Signed   By:  Jules Schick M.D.   On: 12/22/2023 15:46   CT Angio Aortobifemoral W and/or Wo Contrast Result Date: 12/19/2023 CLINICAL DATA:  53 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 Multiplanar CT image reconstructions and MIPs were obtained to evaluate the vascular anatomy. RADIATION DOSE REDUCTION: This exam was performed according to the departmental dose-optimization program which includes automated exposure control, adjustment of the mA and/or kV according to patient size and/or use of iterative reconstruction technique. CONTRAST:  OMNIPAQUE IOHEXOL 350 MG/ML SOLN COMPARISON:   12/07/2023 FINDINGS: VASCULAR Aorta: Mild to moderate atherosclerotic changes of the abdominal aorta. No ulcerated plaque. There is irregular atheromatous plaque projecting into the lumen of the aorta just below the IMA origin, unchanged from the comparison. This does not have a particularly pedunculated appearance and has not enlarged since the comparison. No dissection or periaortic fluid/inflammation. Celiac: Patent, with no significant atherosclerotic changes. SMA: Patent, with no significant atherosclerotic changes. Renals: - Right: Main right renal artery without significant atherosclerosis. Small caliber accessory renal artery with stenosis at the origin to the lower pole segment. - Left: Main left renal artery patent without significant atherosclerosis. Pre hilar branch to the upper pole segment. Accessory left renal artery demonstrates some plaque at the origin, with likely stenosis to the lower pole segment. IMA: Inferior mesenteric artery is patent. Right lower extremity: Interval right common iliac artery stent which is patent. No evidence of in stent stenosis or thrombus formation. Hypogastric artery patent with pelvic arteries patent. External iliac artery patent. Common femoral artery patent without significant atherosclerotic changes. Profunda femoris and thigh branches patent. Length of the superficial femoral artery is patent. There is interval stent within the mid segment of the SFA. No evidence of in stent stenosis or thrombus. Minimal atherosclerotic changes proximally. Popliteal artery patent with similar 50% or less narrowing at the level of the knee joint. Typical trifurcation anatomy. Anterior tibial artery is patent from the origin to the dorsalis pedis without significant atherosclerosis. Tibioperoneal trunk is patent with mild atherosclerosis. Posterior tibial artery is patent from the origin to the plantar artery and into the foot. Peroneal artery is patent from the origin to the ankle.  Left lower extremity: Mild left-sided iliac arterial disease. No high-grade stenosis or occlusion of the common iliac artery or the external iliac artery. Hypogastric artery is patent with soft plaque at the origin estimated 50% narrowing. Pelvic arteries are patent. Common femoral artery with with post intervention changes of metallic closure device. Mild atherosclerosis. Profunda femoris and the thigh branches are patent. The length of the superficial femoral artery is patent. Mild atherosclerosis without high-grade stenosis. Popliteal artery is patent. Typical trifurcation anatomy. Anterior tibial artery is patent from the origin to the dorsalis pedis. Tibioperoneal trunk is patent with mild atherosclerosis. Posterior tibial artery is patent from the origin to the plantar arteries. Peroneal artery is patent from the origin to the ankle. Veins: Unremarkable appearance of the venous system. Review of the MIP images confirms the above findings. NON-VASCULAR Lower chest: No acute. Hepatobiliary: Diffusely decreased attenuation of liver parenchyma suggesting steatosis. Unremarkable gall bladder. Pancreas: Unremarkable. Spleen: Unremarkable. Adrenals/Urinary Tract: - Right adrenal gland: Unremarkable - Left adrenal gland: Unremarkable. - Right kidney: No hydronephrosis, nephrolithiasis, inflammation, or ureteral dilation. Regional cortical thinning in the lower pole segment  corresponding to the stenotic accessory renal artery perfusing this territory. - Left Kidney: No hydronephrosis, nephrolithiasis, inflammation, or ureteral dilation. No focal lesion. - Urinary Bladder: Unremarkable. Stomach/Bowel: - Stomach: Unremarkable. - Small bowel: Unremarkable - Appendix: Presumably surgically absent - Colon: Surgical changes of right colon, similar to the comparison Lymphatic: Small lymph nodes in the right greater than left inguinal regions, presumably reactive. No abdominal or mesenteric adenopathy. Mesenteric: No free  fluid or air. No mesenteric adenopathy. Reproductive: Unremarkable prostate. Other: No hernia. Musculoskeletal: No evidence of acute fracture. No bony canal narrowing. No significant degenerative changes of the hips. IMPRESSION: Negative for acute arterial abnormality. Interval stenting of the right CIA and the right SFA without complicating features. Similar appearance of mild to moderate irregular soft plaque of the abdominal aorta as above. Aortic Atherosclerosis (ICD10-I70.0). Additional changes of peripheral arterial disease include: -mild bilateral iliac arterial disease without high-grade stenosis or occlusion -mild bilateral femoropopliteal disease without high-grade stenosis or occlusion -minimal tibial disease, with 3 vessel runoff to the ankles maintained Renal arterial disease, including high-grade stenosis of the bilateral accessory renal arteries unchanged from prior. Additional ancillary findings as above. Signed, Yvone Neu. Miachel Roux, RPVI [facility] Electronically Signed   By: Gilmer Mor D.O.   On: 12/19/2023 15:26   PERIPHERAL VASCULAR CATHETERIZATION Result Date: 12/09/2023 Images from the original result were not included. Patient name: JAYSON WATERHOUSE MRN: [id number] DOB: 1971/03/18 Sex: male 12/09/2023 Pre-operative Diagnosis: Right blue toe syndrome Post-operative diagnosis:  Same Surgeon:  Durene Cal Procedure Performed:  1.  Ultrasound-guided access, left femoral artery  2.  Abdominal aortogram  3.  Right lower extremity angiogram  4.  Catheter selection right external iliac artery  5.  Stent, right superficial femoral artery (7 x 60 Eluvia)  6.  Stent, right common iliac artery (9 x 39 Eluvia  7.  Conscious sedation, 49 minutes  8.  Closure device, Celt Indications: This is a 53 year old gentleman who presented to the hospital with a painful right blue toe.  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  He  is here for further evaluation of possible intervention Procedure:  The patient was identified in the holding area and taken to room 8.  The patient was then placed supine on the table and prepped and draped in the usual sterile fashion.  A time out was called.  Conscious sedation was administered with the use of IV fentanyl and Versed under continuous physician and nurse monitoring.  Heart rate, blood pressure, and oxygen saturation were continuously monitored.  Total sedation time was 49 minutes.  Ultrasound was used to evaluate the left common femoral artery.  It was patent .  A digital ultrasound image was acquired.  A micropuncture needle was used to access the left common femoral artery under ultrasound guidance.  An 018 wire was advanced without resistance and a micropuncture sheath was placed.  The 018 wire was removed and a benson wire was placed.  The micropuncture sheath was exchanged for a 5 french sheath.  An omniflush catheter was advanced over the wire to the level of L-1.  An abdominal angiogram was obtained.  Next, using the omniflush catheter and a benson wire, the aortic bifurcation was crossed and the catheter was placed into theright external iliac artery and right runoff was obtained. Findings:  Aortogram: No significant renal artery stenosis was visualized.  The infrarenal abdominal aorta is widely patent.  The left common  and external iliac arteries are widely patent.  Luminal valleculae artery with at least 50% stenosis is visualized in the right common iliac artery.  The extrailiac artery is widely patent  Right Lower Extremity: The right common femoral and profundofemoral artery are widely patent.  The superficial femoral artery is patent however in the middle third of the SFA there is a short segment approximately 3 cm lesion with 80% stenosis.  The remaining superficial femoral-popliteal artery are mildly diseased.  There is three-vessel runoff.  There is early disease out onto the foot   Left Lower Extremity: Not evaluated Intervention: After the above images were acquired the decision was made to proceed with intervention.  A 745 sheath was advanced into the right external iliac artery and the patient was fully heparinized.  I crossed the lesion in the superficial femoral artery with a Glidewire advantage.  I elected to primarily stent this.  A 7 x 60 Eluvia stent was deployed across the lesion which was postdilated with a 6 mm balloon.  Completion imaging showed complete resolution of the stenosis and no change in runoff. Attention was then turned towards the iliac lesion.  I deployed a 9 x 39 VBX stent across this lesion with no residual stenosis.  The groin was closed with a Celt Impression:  #1  Short segment greater than 80% stenosis within the right superficial femoral artery successfully treated using a 7 mm Eluvia stent  #2  Focal luminal irregularity in the right common iliac artery treated using a VBX 9 x 39.  This was the area of concern for dissection on CT scan  #3  Patient will be started on aspirin and Plavix.  He will not need heparin or anticoagulation  V. Durene Cal, M.D., [facility] [place] Office: [phone] Pager:  [phone]   VAS [place] ABI WITH/WO TBI Result Date: 12/08/2023  LOWER EXTREMITY DOPPLER STUDY Patient Name:  TAHMID STONEHOCKER  Date of Exam:   12/08/2023 Medical Rec #: [id number]        Accession #:    [id number] Date of Birth: [birth year]        Patient Gender: M Patient Age:   27 years Exam Location:  [facility] Procedure:      VAS [place] ABI WITH/WO TBI Referring Phys: Coral Else --------------------------------------------------------------------------------  Indications: Ischemic right great toe pain High Risk Factors: Hyperlipidemia, Diabetes, current smoker. Other Factors: CTA indicated Focal ulcerative plaque versus chronic short                segment dissection                within the right common iliac artery.  Duplicated bilateral renal                arteries, with high-grade ostial stenosis at the origin of the                inferior renal arteries bilaterally, estimated greater than 70%.                Focal high-grade stenosis within the mid right SFA, estimated                greater than 70%.  Limitations: Today's exam was limited due to Right toe pain. Comparison Study: No prior study on file Performing Technologist: Sherren Kerns RVS  Examination Guidelines: A complete evaluation includes at minimum, Doppler waveform signals and systolic blood pressure reading at the level of  bilateral brachial, anterior tibial, and posterior tibial arteries, when vessel segments are accessible. Bilateral testing is considered an integral part of a complete examination. Photoelectric Plethysmograph (PPG) waveforms and toe systolic pressure readings are included as required and additional duplex testing as needed. Limited examinations for reoccurring indications may be performed as noted.  ABI Findings: +---------+------------------+-----+----------+--------+ Right    Rt Pressure (mmHg)IndexWaveform  Comment  +---------+------------------+-----+----------+--------+ Brachial 135                    triphasic          +---------+------------------+-----+----------+--------+ PTA      112               0.82 biphasic           +---------+------------------+-----+----------+--------+ DP       98                0.72 monophasic         +---------+------------------+-----+----------+--------+ Great Toe                       Abnormal  2nd toe  +---------+------------------+-----+----------+--------+ +---------+------------------+-----+---------+-------+ Left     Lt Pressure (mmHg)IndexWaveform Comment +---------+------------------+-----+---------+-------+ Brachial 136                    triphasic        +---------+------------------+-----+---------+-------+ PTA      144               1.06  biphasic         +---------+------------------+-----+---------+-------+ DP       143               1.05 biphasic         +---------+------------------+-----+---------+-------+ Great Toe120               0.88 Normal           +---------+------------------+-----+---------+-------+ +-------+-----------+-------------------------+------------+------------+ ABI/TBIToday's ABIToday's TBI              Previous ABIPrevious TBI +-------+-----------+-------------------------+------------+------------+ Right  0.82       abnormal 2nd toe waveform                         +-------+-----------+-------------------------+------------+------------+ Left   1.06       0.88                                              +-------+-----------+-------------------------+------------+------------+  Summary: Right: Resting right ankle-brachial index indicates mild right lower extremity arterial disease. Abnormal waveform of right 2nd toe. Left: Resting left ankle-brachial index is within normal range. The left toe-brachial index is normal. *See table(s) above for measurements and observations.  Electronically signed by Gerarda Fraction on 12/08/2023 at 7:01:53 PM.    Final    CT Angio Aortobifemoral W and/or Wo Contrast Result Date: 12/07/2023 CLINICAL DATA:  Right foot pain with discoloration and swelling of great toe, diabetes EXAM: CT ANGIOGRAPHY OF ABDOMINAL AORTA WITH ILIOFEMORAL RUNOFF TECHNIQUE: Multidetector CT imaging of the abdomen, pelvis and lower extremities was performed using the standard protocol during bolus administration of intravenous contrast. Multiplanar CT image reconstructions and MIPs were obtained to evaluate the vascular anatomy. RADIATION DOSE REDUCTION: This exam was performed according to the departmental dose-optimization program which includes automated exposure control, adjustment of the  mA and/or kV according to patient size and/or use of iterative reconstruction technique.  CONTRAST:  OMNIPAQUE IOHEXOL 350 MG/ML SOLN COMPARISON:  None Available. FINDINGS: VASCULAR Aorta: Normal caliber aorta without aneurysm, dissection, vasculitis or significant stenosis. Marked atherosclerosis of the infrarenal aorta. Celiac: Patent without evidence of aneurysm, dissection, vasculitis or significant stenosis. SMA: Patent without evidence of aneurysm, dissection, vasculitis or significant stenosis. 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 There is evidence of high-grade ostial stenosis of the inferior renal arteries bilaterally, estimated greater than 70% stenosis. The more superior bilateral renal arteries are widely patent. No evidence of aneurysm, dissection, or fibromuscular dysplasia. IMA: Patent without evidence of aneurysm, dissection, vasculitis or significant stenosis. 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 Diffuse atherosclerosis. No aneurysm, dissection, vasculitis, or high-grade stenosis. Outflow: Right common femoral artery is widely patent. There is focal high-grade stenosis of the mid right SFA, with greater than 70% stenosis. Right popliteal artery is widely patent. No evidence of aneurysm, dissection, or vasculitis. Runoff: Patent three vessel runoff to the ankle. LEFT Lower Extremity Inflow: Common, internal and external iliac arteries are patent without evidence of aneurysm, dissection, vasculitis or significant stenosis. Mild diffuse atherosclerosis. Outflow: Common, superficial and profunda femoral arteries and the popliteal artery are patent without evidence of aneurysm, dissection, vasculitis or significant stenosis. Runoff: Patent three vessel runoff to the ankle. Veins: No obvious venous abnormality within the limitations of this arterial phase study. Review of the MIP images confirms the above findings. NON-VASCULAR Lower chest: No acute  pleural or parenchymal lung disease. Hepatobiliary: No focal liver abnormality is seen. No gallstones, gallbladder wall thickening, or biliary dilatation. Pancreas: Unremarkable. No pancreatic ductal dilatation or surrounding inflammatory changes. Spleen: Normal in size without focal abnormality. Adrenals/Urinary Tract: Adrenal glands are unremarkable. Kidneys are normal, without renal calculi, focal lesion, or hydronephrosis. Bladder is unremarkable. Stomach/Bowel: No bowel obstruction or ileus. The appendix is surgically absent. No bowel wall thickening or inflammatory change. Lymphatic: No pathologic adenopathy within the abdomen or pelvis. Reproductive: Prostate is unremarkable. Other: No free fluid or free intraperitoneal gas. No abdominal wall hernia. Musculoskeletal: No acute or destructive bony abnormalities. Reconstructed images demonstrate no additional findings. IMPRESSION: VASCULAR 1. 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 No evidence of high-grade stenosis. 2. 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 The more superior main renal arteries are widely patent bilaterally. 3. 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 4.  Aortic Atherosclerosis (ICD10-I70.0). NON-VASCULAR 1. No acute intra-abdominal or intrapelvic process. Electronically Signed   By: Sharlet Salina M.D.   On: 12/07/2023 16:34   DG Foot Complete Right Result Date: 12/07/2023 CLINICAL DATA:  Pain and swelling in the great toe with discoloration. No known injury. History of diabetes. EXAM: RIGHT FOOT COMPLETE - 3+ VIEW COMPARISON:  None Available. FINDINGS: The mineralization and alignment are normal. There is no evidence of acute fracture or dislocation. The joint spaces are preserved. No erosive changes are identified. There is  some soft tissue swelling medially in the forefoot without evidence of soft tissue calcification, foreign body or soft tissue emphysema. IMPRESSION: Nonspecific mild medial forefoot soft tissue swelling. No acute osseous findings or significant arthropathic changes. Electronically Signed   By: Carey Bullocks M.D.   On: 12/07/2023  14:49    Microbiology: Results for orders placed or performed during the hospital encounter of 12/22/23  Blood culture (routine x 2)     Status: None (Preliminary result)   Collection Time: 12/22/23  4:55 PM   Specimen: BLOOD LEFT ARM  Result Value Ref Range Status   Specimen Description BLOOD LEFT ARM  Final   Special Requests   Final    BOTTLES DRAWN AEROBIC AND ANAEROBIC Blood Culture results may not be optimal due to an inadequate volume of blood received in culture bottles   Culture   Final    NO GROWTH 4 DAYS Performed at [facility], [address] [place], [place] [address]    Report Status PENDING  Incomplete  Blood culture (routine x 2)     Status: None (Preliminary result)   Collection Time: 12/22/23  4:55 PM   Specimen: BLOOD  Result Value Ref Range Status   Specimen Description BLOOD RIGHT ANTECUBITAL  Final   Special Requests   Final    BOTTLES DRAWN AEROBIC AND ANAEROBIC Blood Culture adequate volume   Culture   Final    NO GROWTH 4 DAYS Performed at [facility], [address] [place], [place] [address]    Report Status PENDING  Incomplete  Surgical pcr screen     Status: None   Collection Time: 12/23/23  3:56 AM   Specimen: Nasal Mucosa; Nasal Swab  Result Value Ref Range Status   MRSA, PCR NEGATIVE NEGATIVE Final   Staphylococcus aureus NEGATIVE NEGATIVE Final    Comment: (NOTE) The Xpert SA Assay (FDA approved for NASAL specimens in patients 11 years of age and older), is one component of a comprehensive surveillance program. It is not intended to diagnose infection nor to guide or monitor treatment. Performed at  [facility], [address] [place], [place] [address]   Aerobic/Anaerobic Culture w Gram Stain (surgical/deep wound)     Status: None (Preliminary result)   Collection Time: 12/24/23  8:53 AM   Specimen: PATH Soft tissue  Result Value Ref Range Status   Specimen Description TISSUE  Final   Special Requests SOURCE A  Final   Gram Stain   Final    NO WBC SEEN RARE GRAM POSITIVE COCCI IN PAIRS Performed at [facility], [address], [place], [place] [address]    Culture   Final    ABUNDANT STAPHYLOCOCCUS AUREUS NO ANAEROBES ISOLATED; CULTURE IN PROGRESS FOR 5 DAYS    Report Status PENDING  Incomplete   Organism ID, Bacteria STAPHYLOCOCCUS AUREUS  Final      Susceptibility   Staphylococcus aureus - MIC*    CIPROFLOXACIN <=0.5 SENSITIVE Sensitive     ERYTHROMYCIN <=0.25 SENSITIVE Sensitive     GENTAMICIN <=0.5 SENSITIVE Sensitive     OXACILLIN 0.5 SENSITIVE Sensitive     TETRACYCLINE <=1 SENSITIVE Sensitive     VANCOMYCIN 1 SENSITIVE Sensitive     TRIMETH/SULFA <=10 SENSITIVE Sensitive     CLINDAMYCIN <=0.25 SENSITIVE Sensitive     RIFAMPIN <=0.5 SENSITIVE Sensitive     Inducible Clindamycin NEGATIVE Sensitive     LINEZOLID 2 SENSITIVE Sensitive     * ABUNDANT STAPHYLOCOCCUS AUREUS  Fungus Stain     Status: None   Collection Time: 12/24/23  8:53 AM   Specimen: PATH Soft tissue  Result Value Ref Range Status   FUNGUS STAIN Final report  Final    Comment: (NOTE) Performed At: [facility]  [address] [place], [place] [id number] Jolene Schimke MD WU:[id number]    Fungal Source TISSUE  Final    Comment: Performed at [facility], [address] [place], [place] [address]  Acid Fast Smear (AFB)     Status: None   Collection Time: 12/24/23  8:53 AM   Specimen: PATH Soft tissue  Result Value Ref Range Status   AFB Specimen Processing Comment  Final    Comment: Tissue Grinding and Digestion/Decontamination   Acid Fast Smear Negative  Final     Comment: (NOTE) Performed At: [facility] [address] [place], [place] [id number] Jolene Schimke MD [id number]    Source (AFB) TISSUE  Final    Comment: Performed at [facility], [address] [place], [place] [address]  Fungal Stain reflex     Status: None   Collection Time: 12/24/23  8:53 AM  Result Value Ref Range Status   Fungal stain result 1 Comment  Final    Comment: (NOTE) KOH/Calcofluor preparation:  no fungus observed. Performed At: [facility] [address] [place], [place] [id number] Jolene Schimke MD [id number]    Labs: CBC: Recent Labs  Lab 12/22/23 1425 12/23/23 0634 12/25/23 0832  WBC 13.4* 14.2* 14.3*  NEUTROABS 8.2*  --   --   HGB 12.4* 12.2* 12.4*  HCT 36.6* 36.2* 36.8*  MCV 88.0 87.4 86.8  PLT 365 389 377   Basic Metabolic Panel: Recent Labs  Lab 12/22/23 1425 12/23/23 0634 12/25/23 0832  NA 137 140 133*  K 3.4* 4.1 3.9  CL 102 101 99  CO2 25 26 26   GLUCOSE 204* 112* 111*  BUN 7 7 9   CREATININE 0.69 0.76 0.70  CALCIUM 9.0 8.6* 8.9   Liver Function Tests: No results for input(s): "AST", "ALT", "ALKPHOS", "BILITOT", "PROT", "ALBUMIN" in the last 168 hours. CBG: Recent Labs  Lab 12/25/23 1113 12/25/23 1651 12/25/23 2102 12/26/23 0614 12/26/23 1106  GLUCAP 107* 150* 132* 93 136*    Discharge time spent: greater than 30 minutes.  Author: Lynden Oxford, MD  [facility] 12/26/2023

## 2023-12-29 LAB — AEROBIC/ANAEROBIC CULTURE W GRAM STAIN (SURGICAL/DEEP WOUND): Gram Stain: NONE SEEN

## 2023-12-30 ENCOUNTER — Ambulatory Visit (INDEPENDENT_AMBULATORY_CARE_PROVIDER_SITE_OTHER): Payer: Self-pay | Admitting: Podiatry

## 2023-12-30 ENCOUNTER — Encounter: Payer: Self-pay | Admitting: Podiatry

## 2023-12-30 DIAGNOSIS — S98111A Complete traumatic amputation of right great toe, initial encounter: Secondary | ICD-10-CM

## 2023-12-30 NOTE — Progress Notes (Signed)
  Subjective:  Patient ID: Mark Phillips, male    DOB: 12-14-1970,  MRN: [id number]   DOS: 12/24/2023 Procedure: 1. Amputation of great toe at MPJ level, right foot 2. Amputation of fifth toe at MPJ level, right foot  53 y.o. male seen for post op check.   He reports he is doing well he is taking some pain medications but overall pain is well-controlled.  He has finished his antibiotic therapy.  Has been compliant with wearing postop shoe, left the dressing clean dry and intact as instructed  Review of Systems: Negative except as noted in the HPI. Denies N/V/F/Ch.   Objective:   There were no vitals filed for this visit.  There is no height or weight on file to calculate BMI. Constitutional Well developed. Well nourished.  Vascular Foot warm and well perfused. Capillary refill normal to all digits.   No calf pain with palpation  Neurologic Normal speech. Oriented to person, place, and time. Epicritic sensation intact to forefoot  Dermatologic Amputation site right hallux and fifth toe healing well with no dehiscence erythema drainage or maceration no evidence of infection or necrosis   Orthopedic: Status post first and fifth toe amputation   Radiographs: Disarticulation of first and fifth toe at MPJ level  Pathology:  A. RIGHT FIRST AND FIFTH TOE, AMPUTATION:  Gangrenous and coagulative necrosis with acute cellulitis and acute  osteomyelitis    Micro: S. Aureus, S. Simulans  Assessment:   Gangrene of right foot first and fifth toe status post amputation  Plan:  Patient was evaluated and treated and all questions answered.  1 week  s/p right first and fifth toe amputation at MPJ level -Progressing as expected postoperatively -XR: Expected postoperative changes -WB Status: Weightbearing as tolerated in postop shoe  -Sutures: Remain intact. -Medications/ABX: Finish ABX no further antibiotic indicated -Reapply Xeroform gauze dressing.  Leave this clean dry and  intact until next appointment will consider suture removal in 1 week         Corinna Gab, DPM [facility] / [facility]

## 2023-12-31 ENCOUNTER — Telehealth: Payer: Self-pay | Admitting: Podiatry

## 2023-12-31 NOTE — Telephone Encounter (Signed)
 Patient called stating he would like a refill of his Hydracodone. Pt uses [facility] in [place].

## 2024-01-01 ENCOUNTER — Other Ambulatory Visit: Payer: Self-pay | Admitting: Podiatry

## 2024-01-01 MED ORDER — HYDROCODONE-ACETAMINOPHEN 5-325 MG PO TABS: 1.0000 | ORAL_TABLET | Freq: Four times a day (QID) | ORAL | 0 refills | Status: DC | PRN

## 2024-01-01 NOTE — Progress Notes (Signed)
Refill of Norco sent

## 2024-01-06 ENCOUNTER — Ambulatory Visit (INDEPENDENT_AMBULATORY_CARE_PROVIDER_SITE_OTHER): Payer: Self-pay | Admitting: Podiatry

## 2024-01-06 DIAGNOSIS — S98111A Complete traumatic amputation of right great toe, initial encounter: Secondary | ICD-10-CM

## 2024-01-06 NOTE — Progress Notes (Signed)
  Subjective:  Patient ID: Mark Phillips, male    DOB: [birth year],  MRN: [id number]   DOS: 12/24/2023 Procedure: 1. Amputation of great toe at MPJ level, right foot 2. Amputation of fifth toe at MPJ level, right foot  53 y.o. male seen for post op check.   He reports he is having some nerve pain but is taking gabapentin.  Has left the dressing clean dry and intact since last appointment as instructed wearing postop shoe no concerns  Review of Systems: Negative except as noted in the HPI. Denies N/V/F/Ch.   Objective:   There were no vitals filed for this visit.  There is no height or weight on file to calculate BMI. Constitutional Well developed. Well nourished.  Vascular Foot warm and well perfused. Capillary refill normal to all digits.   No calf pain with palpation  Neurologic Normal speech. Oriented to person, place, and time. Epicritic sensation intact to forefoot  Dermatologic Amputation site right hallux and fifth toe healing well with no dehiscence erythema drainage or maceration no evidence of infection or necrosis      Orthopedic: Status post first and fifth toe amputation   Radiographs: Disarticulation of first and fifth toe at MPJ level  Pathology:  A. RIGHT FIRST AND FIFTH TOE, AMPUTATION:  Gangrenous and coagulative necrosis with acute cellulitis and acute  osteomyelitis    Micro: S. Aureus, S. Simulans  Assessment:   Gangrene of right foot first and fifth toe status post amputation  Plan:  Patient was evaluated and treated and all questions answered.  2 week  s/p right first and fifth toe amputation at MPJ level -Progressing as expected postoperatively, continuing to heal well no issues noted -XR: Expected postoperative changes -WB Status: Weightbearing as tolerated in postop shoe until next week Monday and then back to regular shoe gear.  He will be okay to go back to work beginning next week Monday as long as no setbacks with the amputation  site. -Sutures: Removed in total today -Medications/ABX: Finish ABX no further antibiotic indicated -Remove sutures and applied Steri-Strips.  Patient can begin getting the foot wet on Friday he should leave this dressing clean dry and intact until then.  After he begins getting the foot wet use antibiotic ointment and large adhesive bandage on the amputation sites until fully healed          Corinna Gab, DPM [facility] / [facility]

## 2024-01-15 ENCOUNTER — Other Ambulatory Visit: Payer: Self-pay | Admitting: *Deleted

## 2024-01-15 DIAGNOSIS — I739 Peripheral vascular disease, unspecified: Secondary | ICD-10-CM

## 2024-01-15 DIAGNOSIS — I96 Gangrene, not elsewhere classified: Secondary | ICD-10-CM

## 2024-01-23 ENCOUNTER — Ambulatory Visit (HOSPITAL_COMMUNITY): Payer: Self-pay

## 2024-01-23 ENCOUNTER — Encounter (HOSPITAL_COMMUNITY): Payer: Self-pay

## 2024-01-23 LAB — FUNGUS CULTURE WITH STAIN

## 2024-01-23 LAB — FUNGUS CULTURE RESULT

## 2024-01-23 LAB — FUNGAL ORGANISM REFLEX

## 2024-01-27 ENCOUNTER — Ambulatory Visit (INDEPENDENT_AMBULATORY_CARE_PROVIDER_SITE_OTHER): Payer: Self-pay | Admitting: Podiatry

## 2024-01-27 DIAGNOSIS — S98111A Complete traumatic amputation of right great toe, initial encounter: Secondary | ICD-10-CM

## 2024-01-27 MED ORDER — OXYCODONE-ACETAMINOPHEN 5-325 MG PO TABS: 1.0000 | ORAL_TABLET | ORAL | 0 refills | Status: AC | PRN

## 2024-01-27 MED ORDER — GABAPENTIN 300 MG PO CAPS
300.0000 mg | ORAL_CAPSULE | Freq: Three times a day (TID) | ORAL | 3 refills | Status: DC
Start: 2024-01-27 — End: 2024-06-03

## 2024-01-27 NOTE — Progress Notes (Signed)
  Subjective:  Patient ID: Mark Phillips, male    DOB: [birth year],  MRN: [id number]   DOS: 12/24/2023 Procedure: 1. Amputation of great toe at MPJ level, right foot 2. Amputation of fifth toe at MPJ level, right foot  53 y.o. male seen for post op check now 1 mo post op .  He reports nerve pain especially burning and tingling worse at night in the right foot.  Says the gabapentin  does help with that.  He is having trouble trouble sleeping sometimes due to pain and is asking for small refill of pain meds.  Review of Systems: Negative except as noted in the HPI. Denies N/V/F/Ch.   Objective:   There were no vitals filed for this visit.  There is no height or weight on file to calculate BMI. Constitutional Well developed. Well nourished.  Vascular Foot warm and well perfused. Capillary refill normal to all digits.   No calf pain with palpation  Neurologic Normal speech. Oriented to person, place, and time. Epicritic sensation intact to forefoot  Dermatologic Amputation site right hallux and fifth toe healing well with small amount of eschar at both amp sites.  No open wound no drainage no evidence of residual infection or significant necrosis         Orthopedic: Status post first and fifth toe amputation   Radiographs: Disarticulation of first and fifth toe at MPJ level  Pathology:  A. RIGHT FIRST AND FIFTH TOE, AMPUTATION:  Gangrenous and coagulative necrosis with acute cellulitis and acute  osteomyelitis    Micro: S. Aureus, S. Simulans  Assessment:   Gangrene of right foot first and fifth toe status post amputation  Plan:  Patient was evaluated and treated and all questions answered.  1 mo  s/p right first and fifth toe amputation at MPJ level -Progressing as expected postoperatively, continuing to heal well no issues noted -Will increase gabapentin  dose due to nerve pain.  Will send small amount of Percocet for nighttime pain -XR: Expected postoperative  changes -WB Status: Weightbearing as tolerated in reg shoes -Sutures: Removed prior -Medications/ABX: eRx-300 mg 3 times daily and 15 tablets of Percocet - Patient to follow-up in 1 month to ensure that there is complete healing of the amputation sites.  Follow-up with vascular in mid May is scheduled.         Maridee Shoemaker, DPM [facility] / [facility]

## 2024-02-06 LAB — ACID FAST CULTURE WITH REFLEXED SENSITIVITIES (MYCOBACTERIA): Acid Fast Culture: NEGATIVE

## 2024-02-20 ENCOUNTER — Ambulatory Visit (HOSPITAL_COMMUNITY): Payer: Self-pay

## 2024-02-20 ENCOUNTER — Other Ambulatory Visit: Payer: Self-pay | Admitting: *Deleted

## 2024-02-20 ENCOUNTER — Encounter (HOSPITAL_COMMUNITY): Payer: Self-pay

## 2024-02-20 DIAGNOSIS — I739 Peripheral vascular disease, unspecified: Secondary | ICD-10-CM

## 2024-02-20 DIAGNOSIS — I96 Gangrene, not elsewhere classified: Secondary | ICD-10-CM

## 2024-02-24 ENCOUNTER — Ambulatory Visit (INDEPENDENT_AMBULATORY_CARE_PROVIDER_SITE_OTHER): Payer: Self-pay | Admitting: Podiatry

## 2024-02-24 DIAGNOSIS — S98111A Complete traumatic amputation of right great toe, initial encounter: Secondary | ICD-10-CM

## 2024-02-24 NOTE — Progress Notes (Signed)
  Subjective:  Patient ID: Mark Phillips, male    DOB: 1971/04/01,  MRN: [id number]   DOS: 12/24/2023 Procedure: 1. Amputation of great toe at MPJ level, right foot 2. Amputation of fifth toe at MPJ level, right foot  53 y.o. male seen for post op check now 2 months postop.  Still having nerve pain that is improved with gabapentin .  Denies any wound issues.  Putting Band-Aid on over the fifth toe amputation site.   Review of Systems: Negative except as noted in the HPI. Denies N/V/F/Ch.   Objective:   There were no vitals filed for this visit.  There is no height or weight on file to calculate BMI. Constitutional Well developed. Well nourished.  Vascular Foot warm and well perfused. Capillary refill normal to all digits.   No calf pain with palpation  Neurologic Normal speech. Oriented to person, place, and time. Epicritic sensation intact to forefoot  Dermatologic Amputation site right hallux and fifth toe well-healed no open wound or drainage       Orthopedic: Status post first and fifth toe amputation   Radiographs: Disarticulation of first and fifth toe at MPJ level  Pathology:  A. RIGHT FIRST AND FIFTH TOE, AMPUTATION:  Gangrenous and coagulative necrosis with acute cellulitis and acute  osteomyelitis    Micro: S. Aureus, S. Simulans  Assessment:   Gangrene of right foot first and fifth toe status post amputation  Plan:  Patient was evaluated and treated and all questions answered.  2 mo  s/p right first and fifth toe amputation at MPJ level -Progressing as expected postoperatively, nearly fully healed at this time no issues noted -Weightbearing as tolerated regular shoe gear -Compression stockings as needed for edema control -Encouraged him to watch out for increasing redness swelling or drainage from either amputation site however this is unlikely to require at this stage. - Follow-up as needed         Maridee Shoemaker, DPM [facility] / [facility]

## 2024-03-02 ENCOUNTER — Ambulatory Visit (HOSPITAL_BASED_OUTPATIENT_CLINIC_OR_DEPARTMENT_OTHER)
Admission: RE | Admit: 2024-03-02 | Discharge: 2024-03-02 | Disposition: A | Discharge status: Home / Self Care | Payer: Self-pay | Source: Ambulatory Visit | Attending: Vascular Surgery | Admitting: Vascular Surgery

## 2024-03-02 ENCOUNTER — Ambulatory Visit: Payer: Self-pay | Attending: Vascular Surgery | Admitting: Physician Assistant

## 2024-03-02 ENCOUNTER — Ambulatory Visit (HOSPITAL_BASED_OUTPATIENT_CLINIC_OR_DEPARTMENT_OTHER)
Admission: RE | Admit: 2024-03-02 | Discharge: 2024-03-02 | Disposition: A | Discharge status: Home / Self Care | Payer: Self-pay | Source: Ambulatory Visit | Attending: Surgery | Admitting: Surgery

## 2024-03-02 ENCOUNTER — Other Ambulatory Visit (HOSPITAL_COMMUNITY): Payer: Self-pay | Admitting: Surgery

## 2024-03-02 ENCOUNTER — Ambulatory Visit (HOSPITAL_COMMUNITY)
Admission: RE | Admit: 2024-03-02 | Discharge: 2024-03-02 | Disposition: A | Discharge status: Home / Self Care | Payer: Self-pay | Source: Ambulatory Visit | Attending: Vascular Surgery | Admitting: Vascular Surgery

## 2024-03-02 VITALS — BP 168/96 | HR 78 | Temp 98.4°F | Resp 18 | Ht 71.0 in | Wt 173.8 lb

## 2024-03-02 DIAGNOSIS — I96 Gangrene, not elsewhere classified: Secondary | ICD-10-CM | POA: Insufficient documentation

## 2024-03-02 DIAGNOSIS — I739 Peripheral vascular disease, unspecified: Secondary | ICD-10-CM

## 2024-03-02 DIAGNOSIS — Z8249 Family history of ischemic heart disease and other diseases of the circulatory system: Secondary | ICD-10-CM

## 2024-03-02 DIAGNOSIS — F17200 Nicotine dependence, unspecified, uncomplicated: Secondary | ICD-10-CM

## 2024-03-02 LAB — VAS US ABI WITH/WO TBI
Left ABI: 0.89
Right ABI: 0.98

## 2024-03-02 NOTE — Progress Notes (Signed)
 HISTORY AND PHYSICAL     CC:  follow up. Requesting Provider:  [facility]  HPI: This is a 53 y.o. male who is here today for follow up for PAD.  Pt has hx of angiogram with stent to the right CIA and right SFA on 12/09/2023 Dr. Charlotte Cookey for painful right blue toe.  On 12/24/2023, he underwent amputation of right great toe and right 5th toe by Dr. Rosemarie Conquest.  He was seen on 12/22/2023 in the ER and his right 5th toe had worsened and he was having drainage.  He was still smoking.  He did have a palpable right DP pulse and left groin stick site looked fine.  A podiatry consult was obtained.  On 12/24/2023, he underwent amputation of right great toe and right 5th toe by Dr. Rosemarie Conquest.  Pt was seen by Dr. Rosemarie Conquest last week and he was continuing to have some nerve pain that improved with gabapentin .   His wounds were healing.    The pt returns today for follow up.  He states his toe amputation sites have healed.  He does have neuropathy pain in his feet that wakes him.  He is on gabapentin  and this has helped.  He denies any claudication or non healing wounds.  [facility] but was sore afterwards.    The pt is on a statin for cholesterol management.    The pt is on an aspirin .    Other AC:  none The pt is not on medication for hypertension.  The pt is  on medication for diabetes. Tobacco hx:  current  Pt does have family hx of AAA with his mother.   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   PONV (postoperative nausea and vomiting)    Sleep apnea    Type 2 diabetes mellitus (HCC)    diagnosed age 31    Past Surgical History:  Procedure Laterality Date   ABDOMINAL AORTOGRAM W/LOWER EXTREMITY N/A 12/09/2023   Procedure: ABDOMINAL AORTOGRAM W/LOWER EXTREMITY;  Surgeon:  Margherita Shell, MD;  [facility]: [facility];  Service: Cardiovascular;  Laterality: N/A;   AMPUTATION TOE Right 12/24/2023   Procedure: AMPUTATION, TOE;  Surgeon: Evertt Hoe, DPM;  Location: [facility];  Service: Orthopedics/Podiatry;  Laterality: Right;  Right hallux and 5th toe amp   ANKLE ARTHROSCOPY Right 2006   ANKLE RECONSTRUCTION Left    lt reconstruction and rt scope sx   APPENDECTOMY  2000   BIOPSY  09/19/2016   Procedure: BIOPSY;  Surgeon: Suzette Espy, MD;  [facility]: [facility];  Service: Endoscopy;;  colon   COLON SURGERY  02/1999   removal of 8 inches due to perforation.   COLONOSCOPY WITH PROPOFOL  N/A 09/19/2016   Procedure: COLONOSCOPY WITH PROPOFOL ;  Surgeon: Suzette Espy, MD;  Location: [facility];  Service: Endoscopy;  Laterality: N/A;  930    LUMBAR DISC SURGERY     July 2017 - Dr. Joanette Moynahan   PERIPHERAL VASCULAR BALLOON ANGIOPLASTY  12/09/2023   Procedure: PERIPHERAL VASCULAR BALLOON ANGIOPLASTY;  Surgeon: Margherita Shell, MD;  [facility];  Service: Cardiovascular;;  RT SFA, RT Common femoral   SHOULDER ARTHROSCOPY WITH SUBACROMIAL DECOMPRESSION AND OPEN ROTATOR C  Right 02/25/2013   Procedure: RIGHT SHOULDER ARTHROSCOPY WITH SUBACROMIAL DECOMPRESSION AND MINI OPEN ROTATOR CUFF REPAIR;  Surgeon: Loel Ring, MD;  Location: [facility];  Service: Orthopedics;  Laterality: Right;   WISDOM TOOTH EXTRACTION  2011   WISDOM TOOTH EXTRACTION Bilateral     Allergies  Allergen Reactions   Ventolin  [Albuterol ] Swelling    Immediate lip swelling    Current Outpatient Medications  Medication Sig Dispense Refill   ascorbic acid  (VITAMIN C ) 500 MG tablet Take 1 tablet (500 mg total) by mouth daily. 30 tablet 0   aspirin  EC 81 MG tablet Take 1 tablet (81 mg total) by mouth daily. Swallow whole.     atorvastatin  (LIPITOR) 40 MG tablet Take 1 tablet (40 mg total) by mouth daily. 90 tablet 3   clopidogrel  (PLAVIX ) 75 MG tablet Take 1 tablet (75 mg  total) by mouth daily with breakfast. 90 tablet 3   gabapentin  (NEURONTIN ) 100 MG capsule Take 100-200 mg by mouth every 6 (six) hours as needed (neuropathy).     gabapentin  (NEURONTIN ) 300 MG capsule Take 1 capsule (300 mg total) by mouth 3 (three) times daily. 90 capsule 3   HYDROcodone -acetaminophen  (NORCO/VICODIN) 5-325 MG tablet Take 1 tablet by mouth every 6 (six) hours as needed for moderate pain (pain score 4-6) or severe pain (pain score 7-10). 20 tablet 0   ibuprofen  (ADVIL ) 200 MG tablet Take 800 mg by mouth 2 (two) times daily as needed for headache or moderate pain (pain score 4-6).     metFORMIN  (GLUCOPHAGE ) 1000 MG tablet TAKE 1 Tablet  BY MOUTH TWICE DAILY WITH A MEAL (Patient taking differently: Take 1,000 mg by mouth 2 (two) times daily as needed (hyperglycemia).) 180 tablet 1   Multiple Vitamin (MULTIVITAMIN WITH MINERALS) TABS tablet Take 1 tablet by mouth daily. 30 tablet 0   nicotine  (NICODERM CQ  - DOSED IN MG/24 HOURS) 14 mg/24hr patch Place 1 patch (14 mg total) onto the skin daily. 28 patch 0   nutrition supplement, JUVEN, (JUVEN) PACK Take 1 packet by mouth 2 (two) times daily between meals. 60 each 0   Nutritional Supplements (,FEEDING SUPPLEMENT, PROSOURCE PLUS) liquid Take 30 mLs by mouth 2 (two) times daily between meals. 1000 mL 0   thiamine  (VITAMIN B-1) 100 MG tablet Take 1 tablet (100 mg total) by mouth daily. 30 tablet 0   zinc  sulfate, 50mg  elemental zinc , 220 (50 Zn) MG capsule Take 1 capsule (220 mg total) by mouth daily. 30 capsule 0   No current facility-administered medications for this visit.    Family History  Problem Relation Age of Onset   Heart attack Mother    Hypertension Mother    Diabetes Mellitus II Mother    COPD Father    COPD Maternal Grandmother    Colon cancer Maternal Grandmother    Asthma Sister     Social History   Socioeconomic History   Marital status: Married    Spouse name: Not on file   Number of children: Not on file    Years of education: Not on file   Highest education level: Not on file  Occupational History   Not on file  Tobacco Use   Smoking status: Every Day    Current packs/day: 1.00    Average packs/day: 1 pack/day for 30.7 years (30.7 ttl pk-yrs)    Types: Cigarettes    Start date: 06/13/1993   Smokeless tobacco: Former    Types: Chew    Quit date:  10/07/1993  Vaping Use   Vaping status: Never Used  Substance and Sexual Activity   Alcohol use: No   Drug use: No   Sexual activity: Yes    Birth control/protection: None  Other Topics Concern   Not on file  Social History Narrative   Not on file   Social Drivers of Health   Financial Resource Strain: Not on file  Food Insecurity: No Food Insecurity (12/22/2023)   Hunger Vital Sign    Worried About Running Out of Food in the Last Year: Never true    Ran Out of Food in the Last Year: Never true  Transportation Needs: No Transportation Needs (12/22/2023)   PRAPARE - Administrator, Civil Service (Medical): No    Lack of Transportation (Non-Medical): No  Physical Activity: Not on file  Stress: Not on file  Social Connections: Not on file  Intimate Partner Violence: Not At Risk (12/22/2023)   Humiliation, Afraid, Rape, and Kick questionnaire    Fear of Current or Ex-Partner: No    Emotionally Abused: No    Physically Abused: No    Sexually Abused: No     REVIEW OF SYSTEMS:   [X]  denotes positive finding, [ ]  denotes negative finding Cardiac  Comments:  Chest pain or chest pressure:    Shortness of breath upon exertion:    Short of breath when lying flat:    Irregular heart rhythm:        Vascular    Pain in calf, thigh, or hip brought on by ambulation:    Pain in feet at night that wakes you up from your sleep:     Blood clot in your veins:    Leg swelling:         Pulmonary    Oxygen at home:    Productive cough:     Wheezing:         Neurologic    Sudden weakness in arms or legs:     Sudden numbness in  arms or legs:     Sudden onset of difficulty speaking or slurred speech:    Temporary loss of vision in one eye:     Problems with dizziness:         Gastrointestinal    Blood in stool:     Vomited blood:         Genitourinary    Burning when urinating:     Blood in urine:        Psychiatric    Major depression:         Hematologic    Bleeding problems:    Problems with blood clotting too easily:        Skin    Rashes or ulcers:        Constitutional    Fever or chills:      PHYSICAL EXAMINATION:  Today's Vitals   03/02/24 1055  BP: (!) 168/96  Pulse: 78  Resp: 18  Temp: 98.4 F (36.9 C)  TempSrc: Temporal  SpO2: 96%  Weight: 173 lb 12.8 oz (78.8 kg)  Height: 5\' 11"  (1.803 m)  PainSc: 0-No pain   Body mass index is 24.24 kg/m.   General:  WDWN in NAD; vital signs documented above Gait: Not observed HENT: WNL, normocephalic Pulmonary: normal non-labored breathing , without wheezing Cardiac: regular HR, without carotid bruits Abdomen: soft, NT; aortic pulse is not palpable Skin: without rashes Vascular Exam/Pulses:  Right Left  Radial 2+ (normal) 2+ (normal)  Femoral 2+ (normal) 2+ (normal)  DP 2+ (normal) 2+ (normal)   Extremities: without ischemic changes, without Gangrene , without cellulitis; without open wounds; toe amputation sites healed. Musculoskeletal: no muscle wasting or atrophy  Neurologic: A&O X 3 Psychiatric:  The pt has Normal affect.   Non-Invasive Vascular Imaging:   ABI's/TBI's on 03/02/2024: Right:  0.98/0.82 - Great toe pressure: 138 Left:  0.89/0.64 - Great toe pressure: 109  Arterial duplex on 03/02/2024: Stenosis: +--------------------+-----------+-----------+  Location            Stent      Comments     +--------------------+-----------+-----------+  Right Common Iliac  no stenosis             +--------------------+-----------+-----------+  Right External Iliac           no stenosis   +--------------------+-----------+-----------+   Widely patent right common iliac artery stent without evidence of  stenosis.   IVC/Iliac: Patent IVC  +----------+--------+-----+--------+---------+--------+  RIGHT    PSV cm/sRatioStenosisWaveform Comments  +----------+--------+-----+--------+---------+--------+  CFA Prox  158                  triphasic          +----------+--------+-----+--------+---------+--------+  DFA      122                  biphasic           +----------+--------+-----+--------+---------+--------+  SFA Prox  104                  triphasic          +----------+--------+-----+--------+---------+--------+  SFA Mid   110                  triphasic          +----------+--------+-----+--------+---------+--------+  SFA Distal87                   triphasic          +----------+--------+-----+--------+---------+--------+  POP Prox  149                  triphasic          +----------+--------+-----+--------+---------+--------+  POP Mid   103                  triphasic          +----------+--------+-----+--------+---------+--------+  POP Distal80                   triphasic          +----------+--------+-----+--------+---------+--------+  TP Trunk  90                   triphasic          +----------+--------+-----+--------+---------+--------+    Right Stent(s):  +----------------------------------+--------+---------------+---------+----  Proximal SFA (distal to ostium) toPSV cm/sStenosis       Waveform  Comments  mid SFA                                                             +----------------------------------+--------+---------------+---------+----  Prox to Stent                     249     50-99% stenosistriphasic     +----------------------------------+--------+---------------+---------+----  Proximal Stent                    261     50-99% stenosistriphasic      +----------------------------------+--------+---------------+---------+----  Mid Stent                         114                    triphasic +----------------------------------+--------+---------------+---------+----  Distal Stent                      52                     triphasic     +----------------------------------+--------+------------------------------------  Distal to Stent                   68                     triphasic     +----------------------------------+--------+---------------+---------+----   Summary:  Right: Atherosclerosis throughout.  Patent proximal to mid SFA stent with elevated velocities at the pre and  proximal stent segments consistent with 50-99% stenosis, low end range.   Previous ABI's/TBI's on 12/08/2023: Right:  0.82/abnormal waveform - Great toe pressure: n/a Left:  1.06/0.88 - Great toe pressure:  120    ASSESSMENT/PLAN:: 53 y.o. male here for follow up for PAD with hx of angiogram with stent to the right CIA and right SFA on 12/09/2023 Dr. Charlotte Cookey for painful right blue toe.  On 12/24/2023, he underwent amputation of right great toe and right 5th toe by Dr. Rosemarie Conquest.  PAD -pt doing well with palpable pedal pulses.  His toe amputation sites have healed.   Duplex reveals patent stents but mild stenosis proximal to the RLE stent.  Given this, will have him return in 3 months for repeat duplex.  He knows to call sooner if he has any issues before then.   -continue asa/statin/plavix   Family hx of AAA -his mother has hx of AAA.  He did have a CTA in March 2025 and there was no evidence of AAA on the scan.    Current smoker -discussed the importance of smoking cessation with pt.  Given he is diabetic, smoking puts him at higher risk of limb loss, heart attack and stroke.  He expressed understanding.     Maryanna Smart, [facility] [phone]  Clinic MD:   [place] [facility]

## 2024-03-04 ENCOUNTER — Other Ambulatory Visit: Payer: Self-pay | Admitting: *Deleted

## 2024-03-04 DIAGNOSIS — I739 Peripheral vascular disease, unspecified: Secondary | ICD-10-CM

## 2024-06-03 ENCOUNTER — Emergency Department (HOSPITAL_COMMUNITY): Payer: Self-pay

## 2024-06-03 ENCOUNTER — Other Ambulatory Visit: Payer: Self-pay

## 2024-06-03 ENCOUNTER — Inpatient Hospital Stay (HOSPITAL_COMMUNITY)
Admission: EM | Admit: 2024-06-03 | Discharge: 2024-06-05 | DRG: 617 | Disposition: A | Discharge status: Home / Self Care | Payer: Self-pay | Attending: Family Medicine | Admitting: Family Medicine

## 2024-06-03 ENCOUNTER — Encounter (HOSPITAL_COMMUNITY): Payer: Self-pay | Admitting: Emergency Medicine

## 2024-06-03 DIAGNOSIS — E785 Hyperlipidemia, unspecified: Secondary | ICD-10-CM | POA: Diagnosis present

## 2024-06-03 DIAGNOSIS — Z95820 Peripheral vascular angioplasty status with implants and grafts: Secondary | ICD-10-CM

## 2024-06-03 DIAGNOSIS — Z8249 Family history of ischemic heart disease and other diseases of the circulatory system: Secondary | ICD-10-CM

## 2024-06-03 DIAGNOSIS — Z89421 Acquired absence of other right toe(s): Secondary | ICD-10-CM

## 2024-06-03 DIAGNOSIS — Z89411 Acquired absence of right great toe: Secondary | ICD-10-CM

## 2024-06-03 DIAGNOSIS — Z7902 Long term (current) use of antithrombotics/antiplatelets: Secondary | ICD-10-CM

## 2024-06-03 DIAGNOSIS — Z833 Family history of diabetes mellitus: Secondary | ICD-10-CM

## 2024-06-03 DIAGNOSIS — E1151 Type 2 diabetes mellitus with diabetic peripheral angiopathy without gangrene: Secondary | ICD-10-CM | POA: Diagnosis present

## 2024-06-03 DIAGNOSIS — G4733 Obstructive sleep apnea (adult) (pediatric): Secondary | ICD-10-CM | POA: Diagnosis present

## 2024-06-03 DIAGNOSIS — I739 Peripheral vascular disease, unspecified: Secondary | ICD-10-CM | POA: Diagnosis present

## 2024-06-03 DIAGNOSIS — I10 Essential (primary) hypertension: Secondary | ICD-10-CM | POA: Diagnosis present

## 2024-06-03 DIAGNOSIS — L03031 Cellulitis of right toe: Secondary | ICD-10-CM | POA: Diagnosis present

## 2024-06-03 DIAGNOSIS — Z888 Allergy status to other drugs, medicaments and biological substances status: Secondary | ICD-10-CM

## 2024-06-03 DIAGNOSIS — E440 Moderate protein-calorie malnutrition: Secondary | ICD-10-CM | POA: Diagnosis present

## 2024-06-03 DIAGNOSIS — Z881 Allergy status to other antibiotic agents status: Secondary | ICD-10-CM

## 2024-06-03 DIAGNOSIS — Z6823 Body mass index (BMI) 23.0-23.9, adult: Secondary | ICD-10-CM

## 2024-06-03 DIAGNOSIS — E118 Type 2 diabetes mellitus with unspecified complications: Secondary | ICD-10-CM | POA: Diagnosis present

## 2024-06-03 DIAGNOSIS — Z79899 Other long term (current) drug therapy: Secondary | ICD-10-CM

## 2024-06-03 DIAGNOSIS — Z7982 Long term (current) use of aspirin: Secondary | ICD-10-CM

## 2024-06-03 DIAGNOSIS — F17210 Nicotine dependence, cigarettes, uncomplicated: Secondary | ICD-10-CM | POA: Diagnosis present

## 2024-06-03 DIAGNOSIS — Z7984 Long term (current) use of oral hypoglycemic drugs: Secondary | ICD-10-CM

## 2024-06-03 DIAGNOSIS — I70201 Unspecified atherosclerosis of native arteries of extremities, right leg: Secondary | ICD-10-CM | POA: Diagnosis present

## 2024-06-03 DIAGNOSIS — E876 Hypokalemia: Secondary | ICD-10-CM | POA: Diagnosis not present

## 2024-06-03 DIAGNOSIS — Z825 Family history of asthma and other chronic lower respiratory diseases: Secondary | ICD-10-CM

## 2024-06-03 DIAGNOSIS — M879 Osteonecrosis, unspecified: Secondary | ICD-10-CM | POA: Diagnosis present

## 2024-06-03 DIAGNOSIS — Z91199 Patient's noncompliance with other medical treatment and regimen due to unspecified reason: Secondary | ICD-10-CM

## 2024-06-03 DIAGNOSIS — E11628 Type 2 diabetes mellitus with other skin complications: Principal | ICD-10-CM | POA: Diagnosis present

## 2024-06-03 DIAGNOSIS — K219 Gastro-esophageal reflux disease without esophagitis: Secondary | ICD-10-CM | POA: Diagnosis present

## 2024-06-03 DIAGNOSIS — M869 Osteomyelitis, unspecified: Secondary | ICD-10-CM

## 2024-06-03 DIAGNOSIS — Z794 Long term (current) use of insulin: Secondary | ICD-10-CM

## 2024-06-03 DIAGNOSIS — L089 Local infection of the skin and subcutaneous tissue, unspecified: Principal | ICD-10-CM | POA: Diagnosis present

## 2024-06-03 LAB — CBC WITH DIFFERENTIAL/PLATELET
Abs Immature Granulocytes: 0.03 K/uL (ref 0.00–0.07)
Basophils Absolute: 0.1 K/uL (ref 0.0–0.1)
Basophils Relative: 1 %
Eosinophils Absolute: 0.5 K/uL (ref 0.0–0.5)
Eosinophils Relative: 5 %
HCT: 40.8 % (ref 39.0–52.0)
Hemoglobin: 13.8 g/dL (ref 13.0–17.0)
Immature Granulocytes: 0 %
Lymphocytes Relative: 39 %
Lymphs Abs: 4.2 K/uL — ABNORMAL HIGH (ref 0.7–4.0)
MCH: 30 pg (ref 26.0–34.0)
MCHC: 33.8 g/dL (ref 30.0–36.0)
MCV: 88.7 fL (ref 80.0–100.0)
Monocytes Absolute: 0.9 K/uL (ref 0.1–1.0)
Monocytes Relative: 8 %
Neutro Abs: 5.2 K/uL (ref 1.7–7.7)
Neutrophils Relative %: 47 %
Platelets: 208 K/uL (ref 150–400)
RBC: 4.6 MIL/uL (ref 4.22–5.81)
RDW: 12.8 % (ref 11.5–15.5)
WBC: 10.9 K/uL — ABNORMAL HIGH (ref 4.0–10.5)
nRBC: 0 % (ref 0.0–0.2)

## 2024-06-03 LAB — BASIC METABOLIC PANEL WITH GFR
Anion gap: 13 (ref 5–15)
BUN: 9 mg/dL (ref 6–20)
CO2: 25 mmol/L (ref 22–32)
Calcium: 9.4 mg/dL (ref 8.9–10.3)
Chloride: 99 mmol/L (ref 98–111)
Creatinine, Ser: 0.71 mg/dL (ref 0.61–1.24)
GFR, Estimated: >60 mL/min (ref >60)
Glucose, Bld: 116 mg/dL — ABNORMAL HIGH (ref 70–99)
Potassium: 4 mmol/L (ref 3.5–5.1)
Sodium: 137 mmol/L (ref 135–145)

## 2024-06-03 MED ORDER — ONDANSETRON HCL 4 MG PO TABS: 4.0000 mg | ORAL_TABLET | Freq: Four times a day (QID) | ORAL | Status: DC | PRN

## 2024-06-03 MED ORDER — VANCOMYCIN HCL IN DEXTROSE 1-5 GM/200ML-% IV SOLN
1000.0000 mg | Freq: Once | INTRAVENOUS | Status: AC
  Administered 2024-06-03: 1000 mg via INTRAVENOUS
  Filled 2024-06-03: qty 200

## 2024-06-03 MED ORDER — CLOPIDOGREL BISULFATE 75 MG PO TABS
75.0000 mg | ORAL_TABLET | Freq: Every day | ORAL | Status: DC
  Administered 2024-06-04 – 2024-06-05 (×2): 75 mg via ORAL
  Filled 2024-06-03 (×2): qty 1

## 2024-06-03 MED ORDER — SODIUM CHLORIDE 0.9 % IV SOLN
3.0000 g | Freq: Once | INTRAVENOUS | Status: AC
  Administered 2024-06-03: 3 g via INTRAVENOUS
  Filled 2024-06-03: qty 8

## 2024-06-03 MED ORDER — ASPIRIN 81 MG PO TBEC
81.0000 mg | DELAYED_RELEASE_TABLET | Freq: Every day | ORAL | Status: DC
  Administered 2024-06-04 – 2024-06-05 (×2): 81 mg via ORAL
  Filled 2024-06-03 (×2): qty 1

## 2024-06-03 MED ORDER — GABAPENTIN 300 MG PO CAPS
300.0000 mg | ORAL_CAPSULE | Freq: Three times a day (TID) | ORAL | Status: DC
  Administered 2024-06-03 – 2024-06-05 (×5): 300 mg via ORAL
  Filled 2024-06-03 (×5): qty 1

## 2024-06-03 MED ORDER — ONDANSETRON HCL 4 MG/2ML IJ SOLN: 4.0000 mg | Freq: Four times a day (QID) | INTRAMUSCULAR | Status: DC | PRN

## 2024-06-03 MED ORDER — DIPHENHYDRAMINE HCL 50 MG/ML IJ SOLN
25.0000 mg | Freq: Once | INTRAMUSCULAR | Status: AC
  Administered 2024-06-03: 25 mg via INTRAVENOUS
  Filled 2024-06-03: qty 1

## 2024-06-03 MED ORDER — SODIUM CHLORIDE 0.9 % IV SOLN
3.0000 g | Freq: Four times a day (QID) | INTRAVENOUS | Status: DC
  Administered 2024-06-04 (×3): 3 g via INTRAVENOUS
  Filled 2024-06-03 (×3): qty 8

## 2024-06-03 MED ORDER — ACETAMINOPHEN 650 MG RE SUPP: 650.0000 mg | Freq: Four times a day (QID) | RECTAL | Status: DC | PRN

## 2024-06-03 MED ORDER — SENNOSIDES-DOCUSATE SODIUM 8.6-50 MG PO TABS
1.0000 | ORAL_TABLET | Freq: Every evening | ORAL | Status: DC | PRN
Start: 2024-06-03 — End: 2024-06-05

## 2024-06-03 MED ORDER — INSULIN ASPART 100 UNIT/ML IJ SOLN
0.0000 [IU] | INTRAMUSCULAR | Status: DC
  Filled 2024-06-03: qty 1

## 2024-06-03 MED ORDER — ACETAMINOPHEN 325 MG PO TABS: 650.0000 mg | ORAL_TABLET | Freq: Four times a day (QID) | ORAL | Status: DC | PRN

## 2024-06-03 MED ORDER — LINEZOLID 600 MG/300ML IV SOLN
600.0000 mg | Freq: Two times a day (BID) | INTRAVENOUS | Status: DC
  Administered 2024-06-03 – 2024-06-04 (×2): 600 mg via INTRAVENOUS
  Filled 2024-06-03 (×5): qty 300

## 2024-06-03 MED ORDER — OXYCODONE HCL 5 MG PO TABS
5.0000 mg | ORAL_TABLET | ORAL | Status: DC | PRN
  Administered 2024-06-04 – 2024-06-05 (×2): 5 mg via ORAL
  Filled 2024-06-03 (×2): qty 1

## 2024-06-03 MED ORDER — ATORVASTATIN CALCIUM 40 MG PO TABS
40.0000 mg | ORAL_TABLET | Freq: Every day | ORAL | Status: DC
  Administered 2024-06-04 – 2024-06-05 (×2): 40 mg via ORAL
  Filled 2024-06-03 (×2): qty 1

## 2024-06-03 MED ORDER — SODIUM CHLORIDE 0.9 % IV SOLN: INTRAVENOUS | Status: AC

## 2024-06-03 NOTE — H&P (Signed)
 History and Physical    Mark Phillips FMW:[id number] DOB: 1970/12/22 DOA: 06/03/2024  PCP: [facility]   Patient coming from: home   Chief Complaint: Right second toe redness, swelling, blistering, and drainage   HPI: Mark Phillips is a 53 y.o. male with medical history significant for hypertension, type 2 diabetes mellitus, and PAD, presenting with redness, swelling, blistering, and drainage from the right second toe.  Patient underwent stenting of the right SFA and right CIA in March 2025, followed by amputation of the right 1st and 5th toes later that month.  He has just recently noticed redness, swelling, blistering, and purulent drainage from the right second toe.  He has not had fevers or chills associated with this.  Based on vascular surgery clinic notes from late May 2025, duplex revealed that stents were patent there was mild stenosis proximally.  He was scheduled for repeat duplex studies on 06/04/2024.  ED Course: Upon arrival to the ED, patient is found to be afebrile and saturating well on room air with normal HR and stable BP.  Labs are notable for normal creatinine, WBC 10,900, and normal hemoglobin.  Plain radiographs of the right second toe demonstrate soft tissue swelling and gas without acute osseous abnormality.  Podiatry (Dr. Malvin) was consulted by the ED physician, blood cultures were collected, and the patient was treated with vancomycin  and Unasyn .  Patient developed flushing and pruritus without chest pain, hypotension, or respiratory symptoms while vancomycin  was infusing.    Review of Systems:  All other systems reviewed and apart from HPI, are negative.  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   PONV (postoperative nausea and vomiting)    Sleep apnea    Type 2  diabetes mellitus (HCC)    diagnosed age 89    Past Surgical History:  Procedure Laterality Date   ABDOMINAL AORTOGRAM W/LOWER EXTREMITY N/A 12/09/2023   Procedure: ABDOMINAL AORTOGRAM W/LOWER EXTREMITY;  Surgeon: Serene Gaile ORN, MD;  [facility];  Service: Cardiovascular;  Laterality: N/A;   AMPUTATION TOE Right 12/24/2023   Procedure: AMPUTATION, TOE;  Surgeon: Mark Phillips, DPM;  Location: [facility];  Service: Orthopedics/Podiatry;  Laterality: Right;  Right hallux and 5th toe amp   ANKLE ARTHROSCOPY Right 2006   ANKLE RECONSTRUCTION Left    lt reconstruction and rt scope sx   APPENDECTOMY  2000   BIOPSY  09/19/2016   Procedure: BIOPSY;  Surgeon: Lamar CHRISTELLA Hollingshead, MD;  [facility];  Service: Endoscopy;;  colon   COLON SURGERY  02/1999   removal of 8 inches due to perforation.   COLONOSCOPY WITH PROPOFOL  N/A 09/19/2016   Procedure: COLONOSCOPY WITH PROPOFOL ;  Surgeon: Lamar CHRISTELLA Hollingshead, MD;  [facility]: [facility];  Service: Endoscopy;  Laterality: N/A;  930    LUMBAR DISC SURGERY     July 2017 - Dr. Gaither   PERIPHERAL VASCULAR BALLOON ANGIOPLASTY  12/09/2023   Procedure: PERIPHERAL VASCULAR BALLOON ANGIOPLASTY;  Surgeon: Serene Gaile ORN, MD;  [facility]: [facility];  Service: Cardiovascular;;  RT SFA, RT Common femoral   SHOULDER ARTHROSCOPY WITH SUBACROMIAL DECOMPRESSION AND OPEN ROTATOR C Right 02/25/2013   Procedure: RIGHT SHOULDER ARTHROSCOPY WITH SUBACROMIAL DECOMPRESSION AND MINI OPEN ROTATOR CUFF REPAIR;  Surgeon: Reyes JAYSON Billing,  MD;  Location: [facility];  Service: Orthopedics;  Laterality: Right;   WISDOM TOOTH EXTRACTION  2011   WISDOM TOOTH EXTRACTION Bilateral     Social History:   reports that he has been smoking cigarettes. He started smoking about 30 years ago. He has a 31 pack-year smoking history. He quit smokeless tobacco use about 30 years ago.  His smokeless tobacco use included chew. He reports that he does not drink alcohol and  does not use drugs.  Allergies  Allergen Reactions   Ventolin  [Albuterol ] Swelling    Immediate lip swelling   Vancomycin  Other (See Comments)    Vancomycin  infusion reaction with moderate symptoms    Family History  Problem Relation Age of Onset   Heart attack Mother    Hypertension Mother    Diabetes Mellitus II Mother    COPD Father    COPD Maternal Grandmother    Colon cancer Maternal Grandmother    Asthma Sister      Prior to Admission medications   Medication Sig Start Date End Date Taking? Authorizing Provider  acetaminophen  (TYLENOL ) 650 MG CR tablet Take 650 mg by mouth every 8 (eight) hours as needed for pain.   Yes [provider]  Aromatic Inhalants (VICKS VAPOR INHALER IN) Inhale 1 puff into the lungs 2 (two) times daily as needed (congestion). Nasal spray   Yes [provider]  aspirin  EC 81 MG tablet Take 1 tablet (81 mg total) by mouth daily. Swallow whole. 12/09/23  Yes Patsy Lenis, MD  atorvastatin  (LIPITOR) 40 MG tablet Take 1 tablet (40 mg total) by mouth daily. 12/10/23  Yes Patsy Lenis, MD  clopidogrel  (PLAVIX ) 75 MG tablet Take 1 tablet (75 mg total) by mouth daily with breakfast. 12/10/23  Yes Patsy Lenis, MD  gabapentin  (NEURONTIN ) 300 MG capsule Take 300 mg by mouth 3 (three) times daily. 12/16/23  Yes [provider]  metFORMIN  (GLUCOPHAGE ) 1000 MG tablet TAKE 1 Tablet  BY MOUTH TWICE DAILY WITH A MEAL Patient taking differently: Take 500 mg by mouth 2 (two) times daily. 01/06/19  Yes Comer Kirsch, PA-C    Physical Exam: Vitals:   06/03/24 1800 06/03/24 1830 06/03/24 1900 06/03/24 1920  BP: (!) 149/83 (!) 157/88 (!) 144/80   Pulse:      Resp:      Temp:    98.8 F (37.1 C)  TempSrc:    Oral  SpO2:      Weight:      Height:        Constitutional: NAD, no pallor or diaphoresis   Eyes: PERTLA, lids and conjunctivae normal ENMT: Mucous membranes are moist. Posterior pharynx clear of any exudate or lesions.    Neck: supple, no masses  Respiratory: no wheezing, no crackles. No accessory muscle use.  Cardiovascular: S1 & S2 heard, regular rate and rhythm. No extremity edema.  Abdomen: No tenderness, soft. Bowel sounds active.  Musculoskeletal: no clubbing / cyanosis. No joint deformity upper and lower extremities.   Skin: Erythema involving neck and upper trunk without urticaria. Warm, dry, well-perfused. Neurologic: CN 2-12 grossly intact. Moving all extremities. Alert and oriented.  Psychiatric: Calm. Cooperative.    Labs and Imaging on Admission: I have personally reviewed following labs and imaging studies  CBC: Recent Labs  Lab 06/03/24 1834  WBC 10.9*  NEUTROABS 5.2  HGB 13.8  HCT 40.8  MCV 88.7  PLT 208   Basic Metabolic Panel: Recent Labs  Lab 06/03/24 1834  NA 137  K 4.0  CL 99  CO2 25  GLUCOSE 116*  BUN 9  CREATININE 0.71  CALCIUM  9.4   GFR: Estimated Creatinine Clearance: 114.3 mL/min (by C-G formula based on SCr of 0.71 mg/dL). Liver Function Tests: No results for input(s): AST, ALT, ALKPHOS, BILITOT, PROT, ALBUMIN in the last 168 hours. No results for input(s): LIPASE, AMYLASE in the last 168 hours. No results for input(s): AMMONIA in the last 168 hours. Coagulation Profile: No results for input(s): INR, PROTIME in the last 168 hours. Cardiac Enzymes: No results for input(s): CKTOTAL, CKMB, CKMBINDEX, TROPONINI in the last 168 hours. BNP (last 3 results) No results for input(s): PROBNP in the last 8760 hours. HbA1C: No results for input(s): HGBA1C in the last 72 hours. CBG: No results for input(s): GLUCAP in the last 168 hours. Lipid Profile: No results for input(s): CHOL, HDL, LDLCALC, TRIG, CHOLHDL, LDLDIRECT in the last 72 hours. Thyroid Function Tests: No results for input(s): TSH, T4TOTAL, FREET4, T3FREE, THYROIDAB in the last 72 hours. Anemia Panel: No results for input(s): VITAMINB12,  FOLATE, FERRITIN, TIBC, IRON, RETICCTPCT in the last 72 hours. Urine analysis: No results found for: COLORURINE, APPEARANCEUR, LABSPEC, PHURINE, GLUCOSEU, HGBUR, BILIRUBINUR, KETONESUR, PROTEINUR, UROBILINOGEN, NITRITE, LEUKOCYTESUR Sepsis Labs: @LABRCNTIP (procalcitonin:4,lacticidven:4) )No results found for this or any previous visit (from the past 240 hours).   Radiological Exams on Admission: DG Toe 2nd Right Result Date: 06/03/2024 CLINICAL DATA:  Pain infection.  Infection to the second right toe. EXAM: RIGHT SECOND TOE COMPARISON:  Right foot 12/24/2023 FINDINGS: Postoperative changes with previous amputation of the right first toe at the metatarsal-phalangeal level. Soft tissue swelling of the second toe. Small soft tissue gas bubble in the distal soft tissues of the second toe beneath the nail bed. This suggest infection. No radiopaque soft tissue foreign bodies. Bones appear intact. No sclerosis or bone destruction to suggest osteomyelitis. Joint spaces are normal. IMPRESSION: Soft tissue swelling and soft tissue gas to the right second toe suggesting soft tissue infection. No radiographic evidence of osteomyelitis. Electronically Signed   By: Elsie Gravely M.D.   On: 06/03/2024 18:03    Assessment/Plan   1. Right 2nd toe infection  - No acute osseous findings on plain radiographs  - Check MRI, treat with Unasyn  and linezolid , follow-up on podiatry recommendations    2. PAD  - No acute ischemia  - Followed by vascular surgery and planned for repeat Duplex studies on 06/04/24 to evaluate mild stenosis proximal to RLE stents  - Continue ASA, Plavix , and Lipitor   3. Type II DM  - A1c was 7.0% in May 2025  - Check CBGs and use SSI for now     DVT prophylaxis: SCDs Code Status: Full  Level of Care: Level of care: Med-Surg Family Communication: Son at bedside  Disposition Plan:  Patient is from: Home  Anticipated d/c is to: Home  Anticipated  d/c date is: 06/06/24  Patient currently: Pending MRI, podiatry consultation  Consults called: Podiatry  Admission status: Inpatient     Evalene GORMAN Sprinkles, MD [facility]  06/03/2024, 9:44 PM

## 2024-06-03 NOTE — ED Provider Notes (Signed)
 [name] Provider Note   CSN: [id number] Arrival date & time: 06/03/24  1326     Patient presents with: Wound Infection (Right foot)   Mark Phillips is a 53 y.o. male. History of PAD and diabetes.  Presents to ER for right second toe swelling and redness.  He states it has been going on for 2 days.  He has been helping somebody move so has been doing a lot of walking and lifting.  He noticed some redness and swelling couple days ago, yesterday he was very busy and did not look at his foot at all until last night when he took his shoe off and noticed it was very swollen, he bumped the toenail on accident and states he had drainage of pus from the toe.  He denies fever or chills, denies significant pain due to decreased sensation in that foot.  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  Did have prior stent right superficial femoral artery and right common iliac artery in March 2025   HPI     Prior to Admission medications   Medication Sig Start Date End Date Taking? Authorizing Provider  acetaminophen  (TYLENOL ) 650 MG CR tablet Take 650 mg by mouth every 8 (eight) hours as needed for pain.    [provider]  aspirin  EC 81 MG tablet Take 1 tablet (81 mg total) by mouth daily. Swallow whole. 12/09/23   Patsy Lenis, MD  atorvastatin  (LIPITOR) 40 MG tablet Take 1 tablet (40 mg total) by mouth daily. 12/10/23   Patsy Lenis, MD  clopidogrel  (PLAVIX ) 75 MG tablet Take 1 tablet (75 mg total) by mouth daily with breakfast. 12/10/23   Patsy Lenis, MD  gabapentin  (NEURONTIN ) 100 MG capsule Take 100-200 mg by mouth every 6 (six) hours as needed (neuropathy). 12/16/23   [provider]  gabapentin  (NEURONTIN ) 300 MG capsule Take 1 capsule (300 mg total) by mouth 3 (three) times daily. 01/27/24   Standiford, Marsa FALCON, DPM  HYDROcodone -acetaminophen  (NORCO/VICODIN) 5-325 MG tablet Take 1 tablet by  mouth every 6 (six) hours as needed for moderate pain (pain score 4-6) or severe pain (pain score 7-10). Patient not taking: Reported on 03/02/2024 01/01/24   Standiford, Marsa FALCON, DPM  ibuprofen  (ADVIL ) 200 MG tablet Take 800 mg by mouth 2 (two) times daily as needed for headache or moderate pain (pain score 4-6).    [provider]  metFORMIN  (GLUCOPHAGE ) 1000 MG tablet TAKE 1 Tablet  BY MOUTH TWICE DAILY WITH A MEAL Patient taking differently: Take 500 mg by mouth 2 (two) times daily. 01/06/19   Comer Kirsch, PA-C    Allergies: Ventolin  [albuterol ]    Review of Systems  Updated Vital Signs BP (!) 142/92 (BP Location: Right Arm)   Pulse 92   Temp 98.8 F (37.1 C) (Oral)   Resp 20   Ht 5' 11 (1.803 m)   Wt 74.8 kg   SpO2 99%   BMI 23.01 kg/m   Physical Exam Vitals and nursing note reviewed.  Constitutional:      General: He is not in acute distress.    Appearance: He is well-developed.  HENT:     Head: Normocephalic and atraumatic.     Mouth/Throat:     Mouth: Mucous membranes are moist.  Eyes:     Extraocular Movements: Extraocular movements intact.     Conjunctiva/sclera: Conjunctivae normal.     Pupils: Pupils  are equal, round, and reactive to light.  Cardiovascular:     Rate and Rhythm: Normal rate and regular rhythm.     Heart sounds: No murmur heard. Pulmonary:     Effort: Pulmonary effort is normal. No respiratory distress.     Breath sounds: Normal breath sounds.  Abdominal:     Palpations: Abdomen is soft.     Tenderness: There is no abdominal tenderness.  Musculoskeletal:        General: Swelling present.     Cervical back: Neck supple.     Comments: DP and PT pulses in the right foot are intact.  Capillary refill is brisk in the toes, foot is warm and well-perfused.  Swelling noted to the second toe of the right foot.  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     Capillary Refill: Capillary refill takes  less than 2 seconds.     Comments: There is erythema to the skin of the right second toe with warmth.  Blister noted to the distal aspect of the toe.  No active drainage.  Neurological:     General: No focal deficit present.     Mental Status: He is alert and oriented to person, place, and time.  Psychiatric:        Mood and Affect: Mood normal.     (all labs ordered are listed, but only abnormal results are displayed) Labs Reviewed  CBC WITH DIFFERENTIAL/PLATELET - Abnormal; Notable for the following components:      Result Value   WBC 10.9 (*)    Lymphs Abs 4.2 (*)    All other components within normal limits  CULTURE, BLOOD (ROUTINE X 2)  CULTURE, BLOOD (ROUTINE X 2)  BASIC METABOLIC PANEL WITH GFR    EKG: None  Radiology: DG Toe 2nd Right Result Date: 06/03/2024 CLINICAL DATA:  Pain infection.  Infection to the second right toe. EXAM: RIGHT SECOND TOE COMPARISON:  Right foot 12/24/2023 FINDINGS: Postoperative changes with previous amputation of the right first toe at the metatarsal-phalangeal level. Soft tissue swelling of the second toe. Small soft tissue gas bubble in the distal soft tissues of the second toe beneath the nail bed. This suggest infection. No radiopaque soft tissue foreign bodies. Bones appear intact. No sclerosis or bone destruction to suggest osteomyelitis. Joint spaces are normal. IMPRESSION: Soft tissue swelling and soft tissue gas to the right second toe suggesting soft tissue infection. No radiographic evidence of osteomyelitis. Electronically Signed   By: Elsie Gravely M.D.   On: 06/03/2024 18:03     Procedures   Medications Ordered in the ED  Ampicillin -Sulbactam (UNASYN ) 3 g in sodium chloride  0.9 % 100 mL IVPB (has no administration in time range)                                    Medical Decision Making Differential diagnosis includes but limited to abscess, cellulitis, osteomyelitis, other  ED course: Patient here for infection of the  right second toe.  He is diabetic.  He has had prior vascular stents on the side and has normal ABIs earlier this year, he has palpable pulses and brisk capillary refill.  X-ray does show soft tissue gas with no obvious bony erosion.  Labs are pending, will consult with podiatry and plan on likely admission.  Patient ordered IV antibiotics for diabetic foot  infection.  Signed out to Madelin Gentry, PA-C pending labs and podiatry consult.  Patient sees Dr. Malvin for from podiatry as outpatient  Amount and/or Complexity of Data Reviewed External Data Reviewed: labs and notes. Labs: ordered. Radiology: ordered and independent interpretation performed.    Details: Second toe x-ray shows soft tissue gas, no fracture, agree with radiology read        Final diagnoses:  None    ED Discharge Orders     None          Suellen Sherran DELENA DEVONNA 06/03/24 1929    Charlyn Sora, MD 06/03/24 2326

## 2024-06-03 NOTE — ED Triage Notes (Signed)
 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

## 2024-06-03 NOTE — ED Notes (Signed)
[facility] CALLED FOR TRANSPORT °

## 2024-06-03 NOTE — ED Notes (Signed)
 Pt called out for feeling hot and itching since starting Vancomycin ; medication stopped, provider aware, v/o for Benadryl  25mg  IV

## 2024-06-04 ENCOUNTER — Other Ambulatory Visit: Payer: Self-pay

## 2024-06-04 ENCOUNTER — Inpatient Hospital Stay (HOSPITAL_COMMUNITY): Payer: MEDICAID | Admitting: Anesthesiology

## 2024-06-04 ENCOUNTER — Inpatient Hospital Stay (HOSPITAL_COMMUNITY): Payer: MEDICAID

## 2024-06-04 ENCOUNTER — Encounter (HOSPITAL_COMMUNITY): Payer: Self-pay | Admitting: Family Medicine

## 2024-06-04 ENCOUNTER — Encounter (HOSPITAL_COMMUNITY)
Admission: EM | Disposition: A | Discharge status: Home / Self Care | Payer: Self-pay | Source: Home / Self Care | Attending: Internal Medicine

## 2024-06-04 DIAGNOSIS — M869 Osteomyelitis, unspecified: Secondary | ICD-10-CM

## 2024-06-04 DIAGNOSIS — Z7902 Long term (current) use of antithrombotics/antiplatelets: Secondary | ICD-10-CM

## 2024-06-04 DIAGNOSIS — I10 Essential (primary) hypertension: Secondary | ICD-10-CM

## 2024-06-04 DIAGNOSIS — Z89421 Acquired absence of other right toe(s): Secondary | ICD-10-CM

## 2024-06-04 DIAGNOSIS — Z95828 Presence of other vascular implants and grafts: Secondary | ICD-10-CM

## 2024-06-04 DIAGNOSIS — Z7982 Long term (current) use of aspirin: Secondary | ICD-10-CM

## 2024-06-04 DIAGNOSIS — F17210 Nicotine dependence, cigarettes, uncomplicated: Secondary | ICD-10-CM

## 2024-06-04 DIAGNOSIS — Z89411 Acquired absence of right great toe: Secondary | ICD-10-CM

## 2024-06-04 DIAGNOSIS — E1151 Type 2 diabetes mellitus with diabetic peripheral angiopathy without gangrene: Secondary | ICD-10-CM

## 2024-06-04 DIAGNOSIS — Z79899 Other long term (current) drug therapy: Secondary | ICD-10-CM

## 2024-06-04 DIAGNOSIS — G4733 Obstructive sleep apnea (adult) (pediatric): Secondary | ICD-10-CM

## 2024-06-04 DIAGNOSIS — M6289 Other specified disorders of muscle: Secondary | ICD-10-CM

## 2024-06-04 DIAGNOSIS — Z9889 Other specified postprocedural states: Secondary | ICD-10-CM

## 2024-06-04 DIAGNOSIS — L97509 Non-pressure chronic ulcer of other part of unspecified foot with unspecified severity: Secondary | ICD-10-CM

## 2024-06-04 DIAGNOSIS — M79674 Pain in right toe(s): Secondary | ICD-10-CM

## 2024-06-04 DIAGNOSIS — E1169 Type 2 diabetes mellitus with other specified complication: Secondary | ICD-10-CM

## 2024-06-04 DIAGNOSIS — Z7984 Long term (current) use of oral hypoglycemic drugs: Secondary | ICD-10-CM

## 2024-06-04 DIAGNOSIS — I739 Peripheral vascular disease, unspecified: Secondary | ICD-10-CM

## 2024-06-04 HISTORY — PX: AMPUTATION TOE: SHX6595

## 2024-06-04 LAB — CBC
HCT: 36.5 % — ABNORMAL LOW (ref 39.0–52.0)
Hemoglobin: 12.6 g/dL — ABNORMAL LOW (ref 13.0–17.0)
MCH: 29.9 pg (ref 26.0–34.0)
MCHC: 34.5 g/dL (ref 30.0–36.0)
MCV: 86.7 fL (ref 80.0–100.0)
Platelets: 191 K/uL (ref 150–400)
RBC: 4.21 MIL/uL — ABNORMAL LOW (ref 4.22–5.81)
RDW: 12.5 % (ref 11.5–15.5)
WBC: 12.7 K/uL — ABNORMAL HIGH (ref 4.0–10.5)
nRBC: 0 % (ref 0.0–0.2)

## 2024-06-04 LAB — GLUCOSE, CAPILLARY
Glucose-Capillary: 99 mg/dL (ref 70–99)
Glucose-Capillary: 111 mg/dL — ABNORMAL HIGH (ref 70–99)
Glucose-Capillary: 155 mg/dL — ABNORMAL HIGH (ref 70–99)
Glucose-Capillary: 96 mg/dL (ref 70–99)
Glucose-Capillary: 131 mg/dL — ABNORMAL HIGH (ref 70–99)
Glucose-Capillary: 134 mg/dL — ABNORMAL HIGH (ref 70–99)

## 2024-06-04 LAB — BASIC METABOLIC PANEL WITH GFR
Anion gap: 10 (ref 5–15)
BUN: 6 mg/dL (ref 6–20)
CO2: 27 mmol/L (ref 22–32)
Calcium: 8.7 mg/dL — ABNORMAL LOW (ref 8.9–10.3)
Chloride: 100 mmol/L (ref 98–111)
Creatinine, Ser: 0.8 mg/dL (ref 0.61–1.24)
GFR, Estimated: >60 mL/min (ref >60)
Glucose, Bld: 149 mg/dL — ABNORMAL HIGH (ref 70–99)
Potassium: 3.4 mmol/L — ABNORMAL LOW (ref 3.5–5.1)
Sodium: 137 mmol/L (ref 135–145)

## 2024-06-04 LAB — CBG MONITORING, ED: Glucose-Capillary: 224 mg/dL — ABNORMAL HIGH (ref 70–99)

## 2024-06-04 SURGERY — AMPUTATION, TOE
Anesthesia: Monitor Anesthesia Care | Site: Toe | Laterality: Right

## 2024-06-04 MED ORDER — PROPOFOL 500 MG/50ML IV EMUL
INTRAVENOUS | Status: DC | PRN
  Administered 2024-06-04: 100 ug/kg/min via INTRAVENOUS

## 2024-06-04 MED ORDER — LACTATED RINGERS IV SOLN: INTRAVENOUS | Status: DC

## 2024-06-04 MED ORDER — LIDOCAINE HCL 1 % IJ SOLN
INTRAMUSCULAR | Status: DC | PRN
  Administered 2024-06-04: 10 mL via SURGICAL_CAVITY

## 2024-06-04 MED ORDER — AMISULPRIDE (ANTIEMETIC) 5 MG/2ML IV SOLN: 10.0000 mg | Freq: Once | INTRAVENOUS | Status: DC | PRN

## 2024-06-04 MED ORDER — CHLORHEXIDINE GLUCONATE 0.12 % MT SOLN: 15.0000 mL | Freq: Once | OROMUCOSAL | Status: AC

## 2024-06-04 MED ORDER — AMOXICILLIN-POT CLAVULANATE 875-125 MG PO TABS
1.0000 | ORAL_TABLET | Freq: Two times a day (BID) | ORAL | Status: DC
  Administered 2024-06-04 – 2024-06-05 (×3): 1 via ORAL
  Filled 2024-06-04 (×3): qty 1

## 2024-06-04 MED ORDER — SODIUM CHLORIDE 0.9 % IV SOLN: INTRAVENOUS | Status: AC

## 2024-06-04 MED ORDER — POTASSIUM CHLORIDE 10 MEQ/100ML IV SOLN
10.0000 meq | INTRAVENOUS | Status: DC
  Administered 2024-06-04: 10 meq via INTRAVENOUS
  Filled 2024-06-04: qty 100

## 2024-06-04 MED ORDER — MIDAZOLAM HCL 2 MG/2ML IJ SOLN
INTRAMUSCULAR | Status: DC | PRN
  Administered 2024-06-04: 2 mg via INTRAVENOUS

## 2024-06-04 MED ORDER — INSULIN ASPART 100 UNIT/ML IJ SOLN
0.0000 [IU] | INTRAMUSCULAR | Status: DC | PRN
  Administered 2024-06-04: 2 [IU] via SUBCUTANEOUS

## 2024-06-04 MED ORDER — ORAL CARE MOUTH RINSE: 15.0000 mL | Freq: Once | OROMUCOSAL | Status: AC

## 2024-06-04 MED ORDER — POTASSIUM CHLORIDE CRYS ER 20 MEQ PO TBCR
40.0000 meq | EXTENDED_RELEASE_TABLET | Freq: Every day | ORAL | Status: DC
  Administered 2024-06-04 – 2024-06-05 (×2): 40 meq via ORAL
  Filled 2024-06-04 (×2): qty 2

## 2024-06-04 MED ORDER — 0.9 % SODIUM CHLORIDE (POUR BTL) OPTIME
TOPICAL | Status: DC | PRN
  Administered 2024-06-04: 1000 mL

## 2024-06-04 MED ORDER — FENTANYL CITRATE (PF) 250 MCG/5ML IJ SOLN
INTRAMUSCULAR | Status: AC
  Filled 2024-06-04: qty 5

## 2024-06-04 MED ORDER — TOBRAMYCIN SULFATE 80 MG/2ML IJ SOLN
INTRAMUSCULAR | Status: AC
  Filled 2024-06-04: qty 2

## 2024-06-04 MED ORDER — OXYCODONE HCL 5 MG PO TABS: 5.0000 mg | ORAL_TABLET | Freq: Once | ORAL | Status: DC | PRN

## 2024-06-04 MED ORDER — OXYCODONE HCL 5 MG/5ML PO SOLN: 5.0000 mg | Freq: Once | ORAL | Status: DC | PRN

## 2024-06-04 MED ORDER — VANCOMYCIN HCL 500 MG IV SOLR
INTRAVENOUS | Status: AC
  Filled 2024-06-04: qty 10

## 2024-06-04 MED ORDER — PROPOFOL 10 MG/ML IV BOLUS
INTRAVENOUS | Status: AC
  Filled 2024-06-04: qty 20

## 2024-06-04 MED ORDER — BUPIVACAINE HCL (PF) 0.5 % IJ SOLN
INTRAMUSCULAR | Status: AC
  Filled 2024-06-04: qty 30

## 2024-06-04 MED ORDER — FENTANYL CITRATE (PF) 100 MCG/2ML IJ SOLN: 25.0000 ug | INTRAMUSCULAR | Status: DC | PRN

## 2024-06-04 MED ORDER — MIDAZOLAM HCL 2 MG/2ML IJ SOLN
INTRAMUSCULAR | Status: AC
  Filled 2024-06-04: qty 2

## 2024-06-04 MED ORDER — GLUCERNA SHAKE PO LIQD
237.0000 mL | Freq: Two times a day (BID) | ORAL | Status: DC
  Administered 2024-06-04 – 2024-06-05 (×2): 237 mL via ORAL

## 2024-06-04 MED ORDER — LIDOCAINE HCL 1 % IJ SOLN
INTRAMUSCULAR | Status: AC
Start: 2024-06-04 — End: 2024-06-04
  Filled 2024-06-04: qty 20

## 2024-06-04 MED ORDER — CHLORHEXIDINE GLUCONATE 0.12 % MT SOLN
OROMUCOSAL | Status: AC
  Administered 2024-06-04: 15 mL via OROMUCOSAL
  Filled 2024-06-04: qty 15

## 2024-06-04 SURGICAL SUPPLY — 39 items
BLADE AVERAGE 25X9 (BLADE) IMPLANT
BLADE SURG 10 STRL SS (BLADE) ×1 IMPLANT
BLADE SURG 15 STRL LF DISP TIS (BLADE) ×1 IMPLANT
BNDG COHESIVE 3X5 TAN ST LF (GAUZE/BANDAGES/DRESSINGS) ×1 IMPLANT
BNDG COMPR ESMARK 4X3 LF (GAUZE/BANDAGES/DRESSINGS) ×1 IMPLANT
BNDG ELASTIC 3INX 5YD STR LF (GAUZE/BANDAGES/DRESSINGS) ×1 IMPLANT
BNDG ELASTIC 4INX 5YD STR LF (GAUZE/BANDAGES/DRESSINGS) IMPLANT
BNDG GAUZE DERMACEA FLUFF 4 (GAUZE/BANDAGES/DRESSINGS) IMPLANT
CHLORAPREP W/TINT 26 (MISCELLANEOUS) IMPLANT
DRAPE HALF SHEET 40X57 (DRAPES) IMPLANT
DRSG ADAPTIC 3X8 NADH LF (GAUZE/BANDAGES/DRESSINGS) IMPLANT
DRSG XEROFORM 1X8 (GAUZE/BANDAGES/DRESSINGS) IMPLANT
ELECTRODE REM PT RTRN 9FT ADLT (ELECTROSURGICAL) ×1 IMPLANT
GAUZE PAD ABD 8X10 STRL (GAUZE/BANDAGES/DRESSINGS) IMPLANT
GAUZE SPONGE 2X2 STRL 8-PLY (GAUZE/BANDAGES/DRESSINGS) IMPLANT
GAUZE SPONGE 4X4 12PLY STRL (GAUZE/BANDAGES/DRESSINGS) ×1 IMPLANT
GAUZE SPONGE 4X4 12PLY STRL LF (GAUZE/BANDAGES/DRESSINGS) IMPLANT
GAUZE STRETCH 2X75IN STRL (MISCELLANEOUS) ×1 IMPLANT
GAUZE XEROFORM 1X8 LF (GAUZE/BANDAGES/DRESSINGS) ×1 IMPLANT
GLOVE BIO SURGEON STRL SZ7.5 (GLOVE) ×1 IMPLANT
GLOVE BIOGEL PI IND STRL 7.5 (GLOVE) ×1 IMPLANT
GOWN STRL REUS W/ TWL LRG LVL3 (GOWN DISPOSABLE) ×2 IMPLANT
KIT BASIN OR (CUSTOM PROCEDURE TRAY) ×1 IMPLANT
NDL HYPO 25X1 1.5 SAFETY (NEEDLE) ×1 IMPLANT
NEEDLE HYPO 25X1 1.5 SAFETY (NEEDLE) ×1 IMPLANT
PACK ORTHO EXTREMITY (CUSTOM PROCEDURE TRAY) ×1 IMPLANT
PADDING CAST ABS COTTON 4X4 ST (CAST SUPPLIES) ×2 IMPLANT
PENCIL SMOKE EVACUATOR (MISCELLANEOUS) IMPLANT
SET HNDPC FAN SPRY TIP SCT (DISPOSABLE) IMPLANT
SPIKE FLUID TRANSFER (MISCELLANEOUS) IMPLANT
STOCKINETTE 4X48 STRL (DRAPES) IMPLANT
SUT ETHILON 3 0 FSLX (SUTURE) IMPLANT
SUT PROLENE 3 0 PS 2 (SUTURE) IMPLANT
SUT PROLENE 4 0 PS 2 18 (SUTURE) IMPLANT
SYR CONTROL 10ML LL (SYRINGE) ×1 IMPLANT
TUBE CONNECTING 12X1/4 (SUCTIONS) IMPLANT
UNDERPAD 30X36 HEAVY ABSORB (UNDERPADS AND DIAPERS) ×1 IMPLANT
WATER STERILE IRR 1000ML POUR (IV SOLUTION) ×1 IMPLANT
YANKAUER SUCT BULB TIP NO VENT (SUCTIONS) IMPLANT

## 2024-06-04 NOTE — Anesthesia Postprocedure Evaluation (Signed)
 Anesthesia Post Note  Patient: Aakash Hollomon  Procedure(s) Performed: AMPUTATION, TOE (Right: Toe)     Patient location during evaluation: PACU Anesthesia Type: MAC Level of consciousness: awake and alert Pain management: pain level controlled Vital Signs Assessment: post-procedure vital signs reviewed and stable Respiratory status: spontaneous breathing, nonlabored ventilation, respiratory function stable and patient connected to nasal cannula oxygen Cardiovascular status: stable and blood pressure returned to baseline Postop Assessment: no apparent nausea or vomiting Anesthetic complications: no   No notable events documented.  Last Vitals:  Vitals:   06/04/24 1445 06/04/24 1506  BP: 120/80 119/74  Pulse: (!) 58 (!) 54  Resp: 20 16  Temp: (!) 36.4 C 36.9 C  SpO2: 99% 100%    Last Pain:  Vitals:   06/04/24 1506  TempSrc: Oral  PainSc:                  Sheria Rosello L Avis Tirone

## 2024-06-04 NOTE — Progress Notes (Signed)
 PROGRESS NOTE  Mark Phillips FMW:[id number] DOB: 06/15/1971 DOA: 06/03/2024 PCP: [facility]   LOS: 1 day   Brief narrative:   Mark Phillips is a 53 y.o. male with past medical history significant for hypertension, type 2 diabetes mellitus, and PAD presented to the hospital with redness, swelling, blistering and drainage from the right second toe.  Of note patient had undergone stenting of the right SFA and right CIA in March 2025 followed by amputation of the 1st and 5th toes later that month.  Patient has recently experienced redness, swelling, blistering, and purulent drainage from the right second toe.  In the ED patient was afebrile.  Labs are notable for WBC 10,900, and normal hemoglobin.  Plain radiographs of the right second toe demonstrate soft tissue swelling and gas without acute osseous abnormality.  Podiatry was consulted from the ED, blood cultures were collected and patient was started on vancomycin  and Unasyn  and was admitted hospital for further evaluation and treatment.    Assessment/Plan: Principal Problem:   Diabetic foot infection (HCC) Active Problems:   PAD (peripheral artery disease) (HCC)   Type 2 diabetes mellitus with complication, without long-term current use of insulin  (HCC)   Osteomyelitis of second toe of right foot (HCC)  Right 2nd toe infection  Foot x-ray without any acute findings.  MRI of the toes showing acute osteomyelitis of the second distal phalanx with overlying soft tissue ulceration and skin blister, status post amputation of the great toe phalanges.  Amputation of the fifth digit phalanges.  Continue Unasyn  and linezolid .  Follow-up podiatry recommendation.  Blood cultures negative in less than 12 hours.    PAD  No evidence of acute ischemia.  As per vascular surgery patient had plans for duplex evaluation on 06/04/24 to evaluate mild stenosis proximal to RLE stents will follow.  Continue ASA, Plavix , and Lipitor  vascular surgery on board.   Type II DM  Hemoglobin A1c was 7.0% in May 2025.  Continue sliding scale insulin .  Currently NPO.  Mild hypokalemia.  Will replace through IV since patient is n.p.o.  Check levels in AM.  DVT prophylaxis: SCDs Start: 06/03/24 2138   Disposition: Home likely in 1 to 2 days  Status is: Inpatient Remains inpatient appropriate because: Vascular evaluation, possible amputation    Code Status:     Code Status: Full Code  Family Communication: None at bedside  Consultants: Vascular surgery Podiatry  Procedures: None yet  Anti-infectives:  Unasyn  and linezolid   Anti-infectives (From admission, onward)    Start     Dose/Rate Route Frequency Ordered Stop   06/04/24 0000  [MAR Hold]  Ampicillin -Sulbactam (UNASYN ) 3 g in sodium chloride  0.9 % 100 mL IVPB        (MAR Hold since Fri 06/04/2024 at 1319.Hold Reason: Transfer to a Procedural area)   3 g 200 mL/hr over 30 Minutes Intravenous Every 6 hours 06/03/24 2206     06/03/24 2200  [MAR Hold]  linezolid  (ZYVOX ) IVPB 600 mg        (MAR Hold since Fri 06/04/2024 at 1319.Hold Reason: Transfer to a Procedural area)  Placed in And Linked Group   600 mg 300 mL/hr over 60 Minutes Intravenous Every 12 hours 06/03/24 2140 06/10/24 2159   06/03/24 2115  vancomycin  (VANCOCIN ) IVPB 1000 mg/200 mL premix        1,000 mg 200 mL/hr over 60 Minutes Intravenous  Once 06/03/24 2106 06/03/24 2124   06/03/24 1900  Ampicillin -Sulbactam (UNASYN ) 3  g in sodium chloride  0.9 % 100 mL IVPB        3 g 200 mL/hr over 30 Minutes Intravenous  Once 06/03/24 1859 06/03/24 2108        Subjective: Today, patient was seen and examined at bedside.  Patient complains of mild neuropathy-like symptoms in his foot but otherwise okay.  No shortness of breath fever chills nausea vomiting or dyspnea.  Objective: Vitals:   06/04/24 0501 06/04/24 0759  BP: 126/75 116/71  Pulse: 70   Resp: 20 19  Temp: 98.3 F (36.8 C) 97.6 F (36.4  C)  SpO2: 97% 94%   No intake or output data in the 24 hours ending 06/04/24 1321 Filed Weights   06/03/24 1419 06/04/24 0200  Weight: 74.8 kg 75.5 kg   Body mass index is 23.21 kg/m.   Physical Exam:  GENERAL: Patient is alert awake and oriented. Not in obvious distress. HENT: No scleral pallor or icterus. Pupils equally reactive to light. Oral mucosa is moist NECK: is supple, no gross swelling noted. CHEST: Clear to auscultation. No crackles or wheezes.   CVS: S1 and S2 heard, no murmur. Regular rate and rhythm.  ABDOMEN: Soft, non-tender, bowel sounds are present. EXTREMITIES: Right foot with hallux and fifth toe amputation.  Erythema edema and tenderness of the right second toe with blistering medially and laterally.  Sensation to the right foot. CNS: Cranial nerves are intact. No focal motor deficits. SKIN: warm and dry, right foot with erythema of the second toe.  Data Review: I have personally reviewed the following laboratory data and studies,  CBC: Recent Labs  Lab 06/03/24 1834 06/04/24 0206  WBC 10.9* 12.7*  NEUTROABS 5.2  --   HGB 13.8 12.6*  HCT 40.8 36.5*  MCV 88.7 86.7  PLT 208 191   Basic Metabolic Panel: Recent Labs  Lab 06/03/24 1834 06/04/24 0206  NA 137 137  K 4.0 3.4*  CL 99 100  CO2 25 27  GLUCOSE 116* 149*  BUN 9 6  CREATININE 0.71 0.80  CALCIUM  9.4 8.7*   Liver Function Tests: No results for input(s): AST, ALT, ALKPHOS, BILITOT, PROT, ALBUMIN in the last 168 hours. No results for input(s): LIPASE, AMYLASE in the last 168 hours. No results for input(s): AMMONIA in the last 168 hours. Cardiac Enzymes: No results for input(s): CKTOTAL, CKMB, CKMBINDEX, TROPONINI in the last 168 hours. BNP (last 3 results) No results for input(s): BNP in the last 8760 hours.  ProBNP (last 3 results) No results for input(s): PROBNP in the last 8760 hours.  CBG: Recent Labs  Lab 06/04/24 0043 06/04/24 0501  06/04/24 0824  GLUCAP 224* 99 111*   Recent Results (from the past 240 hours)  Blood culture (routine x 2)     Status: None (Preliminary result)   Collection Time: 06/03/24  7:18 PM   Specimen: BLOOD  Result Value Ref Range Status   Specimen Description BLOOD BLOOD RIGHT ARM  Final   Special Requests   Final    BOTTLES DRAWN AEROBIC AND ANAEROBIC Blood Culture adequate volume   Culture   Final    NO GROWTH < 12 HOURS Performed at [facility], [address], [place], [place] [address]    Report Status PENDING  Incomplete  Blood culture (routine x 2)     Status: None (Preliminary result)   Collection Time: 06/03/24  7:18 PM   Specimen: BLOOD  Result Value Ref Range Status   Specimen Description BLOOD BLOOD LEFT ARM  Final   Special Requests   Final    BOTTLES DRAWN AEROBIC AND ANAEROBIC Blood Culture adequate volume   Culture   Final    NO GROWTH < 12 HOURS Performed at [facility], [address] [place], [place] [address]    Report Status PENDING  Incomplete     Studies: VAS US  LOWER EXTREMITY ARTERIAL DUPLEX Result Date: 06/04/2024 LOWER EXTREMITY ARTERIAL DUPLEX STUDY Patient Name:  Mark Phillips  Date of Exam:   06/04/2024 Medical Rec #: [id number]            Accession #:    [id number] Date of Birth: [birth year]            Patient Gender: M Patient Age:   82 years Exam Location:  [facility] Procedure:      VAS US  LOWER EXTREMITY ARTERIAL DUPLEX Referring Phys: MARSA STANDIFORD --------------------------------------------------------------------------------   Current ABI: RT 0.77 LT 0.92 Performing Technologist: Jody Hill RVT, RDMS  Examination Guidelines: A complete evaluation includes B-mode imaging, spectral Doppler, color Doppler, and power Doppler as needed of all accessible portions of each vessel. Bilateral testing is considered an integral part of a complete examination. Limited examinations for reoccurring indications may be performed as noted.   +-----------+--------+-----+---------------+----------+--------+ RIGHT      PSV cm/sRatioStenosis       Waveform  Comments +-----------+--------+-----+---------------+----------+--------+ CFA Prox   143                         biphasic           +-----------+--------+-----+---------------+----------+--------+ CFA Mid    97                          biphasic           +-----------+--------+-----+---------------+----------+--------+ CFA Distal 95                          biphasic           +-----------+--------+-----+---------------+----------+--------+ DFA        110                         triphasic          +-----------+--------+-----+---------------+----------+--------+ SFA Prox   79                          biphasic           +-----------+--------+-----+---------------+----------+--------+ SFA Distal 63                          monophasic         +-----------+--------+-----+---------------+----------+--------+ POP Prox   50                          monophasic         +-----------+--------+-----+---------------+----------+--------+ POP Mid    68                          monophasic         +-----------+--------+-----+---------------+----------+--------+ POP Distal 337          75-99% stenosismonophasic         +-----------+--------+-----+---------------+----------+--------+ TP Trunk   57  monophasic         +-----------+--------+-----+---------------+----------+--------+ ATA Prox   40                          monophasic         +-----------+--------+-----+---------------+----------+--------+ ATA Mid    30                          monophasic         +-----------+--------+-----+---------------+----------+--------+ ATA Distal 33                          monophasic         +-----------+--------+-----+---------------+----------+--------+ PTA Prox   35                          monophasic          +-----------+--------+-----+---------------+----------+--------+ PTA Mid    35                          monophasic         +-----------+--------+-----+---------------+----------+--------+ PTA Distal 33                          monophasic         +-----------+--------+-----+---------------+----------+--------+ PERO Prox  16                          monophasic         +-----------+--------+-----+---------------+----------+--------+ PERO Mid   20                          monophasic         +-----------+--------+-----+---------------+----------+--------+ PERO Distal22                          monophasic         +-----------+--------+-----+---------------+----------+--------+ DP         29                          monophasic         +-----------+--------+-----+---------------+----------+--------+  Right Stent(s): +---------------+---+---------------+----------+-----------------------+ Prox to Stent  42850-99% stenosismonophasichypoechoic/heterogenous +---------------+---+---------------+----------+-----------------------+ Proximal Stent 72                monophasicpost-stenotic waveform  +---------------+---+---------------+----------+-----------------------+ Mid Stent      23                monophasic                        +---------------+---+---------------+----------+-----------------------+ Distal Stent   20                monophasic                        +---------------+---+---------------+----------+-----------------------+ Distal to Stent39                monophasichypoechoic/heterogenous +---------------+---+---------------+----------+-----------------------+     Summary: Right: 75-99% stenosis noted in the popliteal artery. RLE SFA stent is patent, however there is a 50-99% stenosis just proximal to the stent. Hypoechoic and heterogenous plaque seen throughout CFA, SFA, and PopA.  See table(s) above for  measurements and observations.     Preliminary    VAS US  ABI WITH/WO TBI Result Date: 06/04/2024  LOWER EXTREMITY DOPPLER STUDY Patient Name:  Mark Phillips  Date of Exam:   06/04/2024 Medical Rec #: [id number]            Accession #:    [id number] Date of Birth: 11/24/1970            Patient Gender: M Patient Age:   34 years Exam Location:  [facility] Procedure:      VAS US  ABI WITH/WO TBI Referring Phys: MARSA HONOUR --------------------------------------------------------------------------------  Indications: Ulceration, and peripheral artery disease. Right great toe and 5th              toe amputations 12/24/23 High Risk Factors: Hypertension, Diabetes, current smoker.  Vascular Interventions: Right SFA and common iliac stenting 12/09/23. Comparison Study: Prior ABI done 03/02/24 Performing Technologist: Rachel Pellet RVS  Examination Guidelines: A complete evaluation includes at minimum, Doppler waveform signals and systolic blood pressure reading at the level of bilateral brachial, anterior tibial, and posterior tibial arteries, when vessel segments are accessible. Bilateral testing is considered an integral part of a complete examination. Photoelectric Plethysmograph (PPG) waveforms and toe systolic pressure readings are included as required and additional duplex testing as needed. Limited examinations for reoccurring indications may be performed as noted.  ABI Findings: +---------+------------------+-----+----------+--------------------------+ Right    Rt Pressure (mmHg)IndexWaveform  Comment                    +---------+------------------+-----+----------+--------------------------+ Brachial 142                    triphasic                            +---------+------------------+-----+----------+--------------------------+ PTA      90                0.63 biphasic                             +---------+------------------+-----+----------+--------------------------+ DP       110               0.77  monophasic                           +---------+------------------+-----+----------+--------------------------+ Great Toe90                0.63           second toe (great toe amp) +---------+------------------+-----+----------+--------------------------+ +---------+------------------+-----+----------+-------+ Left     Lt Pressure (mmHg)IndexWaveform  Comment +---------+------------------+-----+----------+-------+ Brachial 142                    triphasic         +---------+------------------+-----+----------+-------+ PTA      130               0.92 monophasic        +---------+------------------+-----+----------+-------+ DP       122               0.86 biphasic          +---------+------------------+-----+----------+-------+ Great Toe90                0.63                   +---------+------------------+-----+----------+-------+ +-------+-----------+------------+------------+---------------------------+  ABI/TBIToday's ABIToday's TBI Previous ABIPrevious TBI                +-------+-----------+------------+------------+---------------------------+ Right  0.77       0.63 2nd toe0.98        great toe amp, 2nd toe 0.82 +-------+-----------+------------+------------+---------------------------+ Left   0.92       0.63        0.89        0.88                        +-------+-----------+------------+------------+---------------------------+   Right ABIs and bilateral TBIs appear decreased compared to prior study on 03/02/2024. Left ABIs appear essentially unchanged compared to prior study on 03/02/2024.  Summary: Right: Resting right ankle-brachial index indicates moderate right lower extremity arterial disease. The right toe-brachial index is abnormal.  2nd toe used for TBI. Left: Resting left ankle-brachial index indicates mild left lower extremity arterial disease. The left toe-brachial index is abnormal.  *See table(s) above for measurements and observations.      Preliminary    MR TOES RIGHT WO CONTRAST Result Date: 06/04/2024 CLINICAL DATA:  Right second toe infection.  History of diabetes. EXAM: MRI OF THE RIGHT TOES WITHOUT CONTRAST TECHNIQUE: Multiplanar, multisequence MR imaging of the right forefoot was performed. No intravenous contrast was administered. COMPARISON:  Right second toe radiographs dated 06/03/2024. FINDINGS: Bones/Joint/Cartilage T2/STIR hyperintense and T1 hypointense marrow signal abnormality of the second distal phalanx, compatible with acute osteomyelitis. Overlying soft tissue ulceration and skin blister. No marrow signal abnormality identified elsewhere to suggest osteomyelitis. Status post amputation of the great toe distal and proximal phalanges. Serpiginous signal abnormality of the first metatarsal head likely reflects osteonecrosis. Status post amputation of the fifth digit phalanges. No joint effusion. Ligaments Lisfranc ligament is intact. Muscles and Tendons No significant tenosynovitis. Mildly increased T2 signal in the intrinsic foot musculature, may reflect chronic denervation changes, however, myositis cannot be excluded. Soft tissue Soft tissue ulceration and skin blistering of the distal second digit with surrounding edema. No loculated fluid collection. IMPRESSION: 1. Acute osteomyelitis of second distal phalanx with overlying soft tissue ulceration and skin blister. No abscess. 2. Status post amputation of the great toe phalanges. 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 3. Status post amputation of the fifth digit phalanges. 4. Mildly increased T2 signal in the intrinsic foot musculature, may reflect chronic denervation changes, however, myositis cannot be excluded. Electronically Signed   By: Harrietta Sherry M.D.   On: 06/04/2024 10:23   DG Toe 2nd Right Result Date: 06/03/2024 CLINICAL DATA:  Pain infection.  Infection to the second right toe. EXAM: RIGHT SECOND TOE COMPARISON:  Right foot  12/24/2023 FINDINGS: Postoperative changes with previous amputation of the right first toe at the metatarsal-phalangeal level. Soft tissue swelling of the second toe. Small soft tissue gas bubble in the distal soft tissues of the second toe beneath the nail bed. This suggest infection. No radiopaque soft tissue foreign bodies. Bones appear intact. No sclerosis or bone destruction to suggest osteomyelitis. Joint spaces are normal. IMPRESSION: Soft tissue swelling and soft tissue gas to the right second toe suggesting soft tissue infection. No radiographic evidence of osteomyelitis. Electronically Signed   By: Elsie Gravely M.D.   On: 06/03/2024 18:03      Vernal Alstrom, MD  [facility] 06/04/2024  If 7PM-7AM, please contact night-coverage

## 2024-06-04 NOTE — Plan of Care (Signed)

## 2024-06-04 NOTE — Consult Note (Signed)
 PODIATRY CONSULTATION  NAME Mark Phillips MRN [id number] DOB 1970/12/21 DOA 06/03/2024   Reason for consult:  Chief Complaint  Patient presents with   Wound Infection    Right foot    Attending/Consulting physician: L. Pokhrel MD  History of present illness: Mark Phillips is a 53 y.o. male with medical history significant for hypertension, type 2 diabetes mellitus, and PAD, presenting with redness, swelling, blistering, and drainage from the right second toe.   Patient underwent stenting of the right SFA and right CIA in March 2025, followed by amputation of the right 1st and 5th toes later that month.  He has just recently noticed redness, swelling, blistering, and purulent drainage from the right second toe.  He has not had fevers or chills associated with this.   Based on vascular surgery clinic notes from late May 2025, duplex revealed that stents were patent there was mild stenosis proximally.  He was scheduled for repeat duplex studies on 06/04/2024.   Pt seen and discussed MRI findings. He says the changes to the right second toe cam on very rapidly. We discussed concern for osteomyelitis and likely need for amputation of the toe.   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24 mg/dL 9.19  9.28  9.29   Sodium 135 - 145 mmol/L 137  137  133   Potassium 3.5 - 5.1 mmol/L 3.4  4.0  3.9   Chloride 98 - 111 mmol/L 100  99  99   CO2 22 - 32 mmol/L 27  25  26    Calcium  8.9 - 10.3 mg/dL 8.7  9.4  8.9       Physical Exam: Lower Extremity Exam  R foot prior hallux and 5th toe amputation, well healed  Erythema and edema of the distal right 2nd toe with blistering medial and lateral  Non palpable DP/PT on RLE, there is cap refill to the 2nd toe  Sensation diminished to right foot   ASSESSMENT/PLAN OF CARE 53 y.o. male with PMHx significant for  hypertension, type 2 diabetes mellitus, and PAD  with cellulitis right 2nd toe with concern for OM of the distal phalanx on R 2nd toe.   WBC 12.7  MRI toes R WO contrast: 1. Acute osteomyelitis of second distal phalanx with overlying soft tissue ulceration and skin blister. No abscess.  2. Status post amputation of the great toe phalanges. 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 3. Status post amputation of the fifth digit phalanges. 4. Mildly increased T2 signal in the intrinsic foot musculature, may reflect chronic denervation changes, however, myositis cannot be excluded.  ABI / PVR: R TBI: 0.63, ABI 0.77, monophasic, toe pressure 90  - NPO for possible OR this afternoon R 2nd toe partial amputation pending vascular consultation, discussed with the PA who recommend formal consult given prior stenting. If no vascular intervention needed, possible OR later this afternoon  - Continue IV abx broad spectrum pending further culture data - Anticoagulation: ok to continue per primary/vascular recs - Wound care: none require  - WB status: WBAT R foot - Will continue to follow   Thank you for the consult.  Please contact me directly with any questions or concerns.           Mark Phillips, DPM [facility]    [address]  [place], [place] [address]                Office [phone]  Fax [phone]

## 2024-06-04 NOTE — Hospital Course (Signed)
 Mark Phillips is a 53 y.o. male with past medical history significant for hypertension, type 2 diabetes mellitus, and PAD presented to the hospital with redness, swelling, blistering and drainage from the right second toe.  Of note patient had undergone stenting of the right SFA and right CIA in March 2025 followed by amputation of the 1st and 5th toes later that month.  Patient has recently experienced redness, swelling, blistering, and purulent drainage from the right second toe.  In the ED patient was afebrile.  Labs are notable for WBC 10,900, and normal hemoglobin.  Plain radiographs of the right second toe demonstrate soft tissue swelling and gas without acute osseous abnormality.  Podiatry was consulted from the ED, blood cultures were collected and patient was started on vancomycin  and Unasyn  and was admitted hospital for further evaluation and treatment.  Assessment/Plan    Right 2nd toe infection  Plan x-ray without any acute findings.  MRI of the toes pending.  Continue Unasyn  and linezolid .  Follow-up podiatry recommendation.    PAD  No evidence of acute ischemia.  As per vascular surgery patient had plans for duplex evaluation on 06/04/24 to evaluate mild stenosis proximal to RLE stents will follow.  Continue ASA, Plavix , and Lipitor    Type II DM  Hemoglobin A1c was 7.0% in May 2025.  Continue sliding scale insulin .

## 2024-06-04 NOTE — Progress Notes (Signed)
 Initial Nutrition Assessment  DOCUMENTATION CODES:   Non-severe (moderate) malnutrition in context of chronic illness  INTERVENTION:  -Currently NPO for toe amputation, diet to be advanced per MD. Recommend CM menu -Add Glucerna BID to promote adequate kcal/pro intake -Discussed importance of adequate kcal/pro intake for healing, fueling body, etc -Discussed diet recommendations for DM2   NUTRITION DIAGNOSIS:   Moderate Malnutrition related to chronic illness as evidenced by mild fat depletion, mild muscle depletion, edema, energy intake < 75% for > or equal to 3 months.  GOAL:   Patient will meet greater than or equal to 90% of their needs   MONITOR:   PO intake, Supplement acceptance, Labs, Weight trends, Skin  REASON FOR ASSESSMENT:   Consult Wound healing  ASSESSMENT:   Hx hypertension, type 2 diabetes mellitus, and PAD, presenting with redness, swelling, blistering, and drainage from the right second toe.  Spoke to pt at bedside. Pt currently NPO s/p imaging this morning, has yet to see MD regarding results (walked in at end of visit, taken to get toe amputated). Pt denies n/v/c/d or chewing/swallowing difficulties. Last BM PTA. Pt denies recent weight changes, no documented weight changes. Pt says he has fair appetite at baseline, though, has stomach troubles that impact his eating. Pt elaborated and stated he had 8 inches of his colon resected, unsure of exact year. Pt also states that instead of eating bad (high carbohydrate) foods that sometimes he just won't eat anything. In depth discussion regarding the need for not only carb moderation, but carbohydrate consistency as well. Pt verbalized understanding. Discussed non-diet/restrictive approach to changing diet. Pt does also endorse major taste disturbances after COVID19 infection in 2020. They have slowly improved but still impact his food choices. Will check Zinc  levels to ensure not contributing. NFPE completed  (see below), pt does meet criteria for moderate protein calorie malnutrition. Pt denies additional questions/concerns at this time, will continue to monitor, RDN available prn.   Labs BG 99-155 Potassium 3.4 Calcium  8.7 H/H 12.6/36.5  Medications IVF IV Unasyn  IV Zyvox  Atorvastatin  Gabapentin  SSI Vancomycin    NUTRITION - FOCUSED PHYSICAL EXAM:  Flowsheet Row Most Recent Value  Orbital Region Moderate depletion  Upper Arm Region Mild depletion  Thoracic and Lumbar Region Mild depletion  Buccal Region Mild depletion  Temple Region Moderate depletion  Clavicle Bone Region Mild depletion  Clavicle and Acromion Bone Region Mild depletion  Scapular Bone Region Mild depletion  Dorsal Hand Mild depletion  Patellar Region Mild depletion  Anterior Thigh Region Mild depletion  Posterior Calf Region Mild depletion  Edema (RD Assessment) Mild  Hair Reviewed  Eyes Reviewed  Mouth Reviewed  Skin Reviewed  Nails Reviewed    Diet Order:   Diet Order             Diet NPO time specified Except for: Sips with Meds, Ice Chips  Diet effective midnight                   EDUCATION NEEDS:   Education needs have been addressed  Skin:  Skin Assessment: Reviewed RN Assessment  Last BM:  PTA  Height:   Ht Readings from Last 1 Encounters:  06/04/24 5' 11 (1.803 m)    Weight:   Wt Readings from Last 1 Encounters:  06/04/24 75.5 kg    BMI:  Body mass index is 23.21 kg/m.  Estimated Nutritional Needs:   Kcal:  2000-2450 kcal  Protein:  90-115 g  Fluid:  >/= 2 L  Bernice Mcauliffe Daml-Budig, RDN, LDN Registered Dietitian Nutritionist RD Inpatient Contact Info in [place]

## 2024-06-04 NOTE — Anesthesia Preprocedure Evaluation (Addendum)
 Anesthesia Evaluation  Patient identified by MRN, date of birth, ID band Patient awake    Reviewed: Allergy & Precautions, NPO status , Patient's Chart, lab work & pertinent test results  History of Anesthesia Complications (+) PONV and history of anesthetic complications  Airway Mallampati: II  TM Distance: >3 FB Neck ROM: Full    Dental  (+) Dental Advisory Given, Chipped, Poor Dentition,    Pulmonary sleep apnea (noncompliant with CPAP) , Current Smoker and Patient abstained from smoking.   Pulmonary exam normal breath sounds clear to auscultation       Cardiovascular hypertension, + Peripheral Vascular Disease  Normal cardiovascular exam Rhythm:Regular Rate:Normal  TTE 2017 EF normal, no significant valvular abnormalities   Neuro/Psych negative neurological ROS  negative psych ROS   GI/Hepatic Neg liver ROS,GERD  ,,  Endo/Other  diabetes, Type 2, Oral Hypoglycemic Agents    Renal/GU negative Renal ROS  negative genitourinary   Musculoskeletal negative musculoskeletal ROS (+)    Abdominal   Peds  Hematology  (+) Blood dyscrasia (plavix )   Anesthesia Other Findings   Reproductive/Obstetrics                              Anesthesia Physical Anesthesia Plan  ASA: 3  Anesthesia Plan: MAC   Post-op Pain Management: Tylenol  PO (pre-op)*   Induction: Intravenous  PONV Risk Score and Plan: 1 and Propofol  infusion, Treatment may vary due to age or medical condition, Midazolam , Ondansetron  and Dexamethasone   Airway Management Planned: Natural Airway  Additional Equipment:   Intra-op Plan:   Post-operative Plan:   Informed Consent: I have reviewed the patients History and Physical, chart, labs and discussed the procedure including the risks, benefits and alternatives for the proposed anesthesia with the patient or authorized representative who has indicated his/her understanding and  acceptance.     Dental advisory given  Plan Discussed with: CRNA  Anesthesia Plan Comments:         Anesthesia Quick Evaluation

## 2024-06-04 NOTE — Consult Note (Addendum)
 Hospital Consult    Reason for Consult:  PAD,  Requesting Physician:  Dr. Malvin MRN #:  [id number]  History of Present Illness: This is a 53 y.o. male with past medical history significant for HTN, Type II DM, HLD, OSA, GERD, and PAD presenting with redness, swelling, blistering and drainage from right 2nd toe. He explains that he just noticed it over the past couple days. He reports that he has been in process of moving and has been up on his legs more. He went to take a shower and noticed that his foot and toe was swollen and red. He says he lifted the nail and some tan purulent drainage came out. No fever or chills. The toe and ball of foot are sore. He continues to have neuropathic pain but this is not changed. He denies any pain prior to this while ambulating. Vascular surgery was consulted for evaluation of his right lower extremity perfusion prior Amputation of 2nd toe. Mr. Bazen is familiar to our service from recently undergoing Angiogram with right common iliac and SFA stenting on 12/09/23 by Dr. Serene. He required subsequent right great toe amputation on 12/24/23 by Dr. Malvin. He was able to successfully heal his right great toe amputation site. At his recent office visit in May his non invasive studies indicated some elevated velocities in the proximal SFA stent. No indication for intervention at that time as he had healed his wound and was without any claudication or rest pain.  He does continue to smoke 1 ppd. On presentation he has been afebrile, mild leukocytosis 12k. He has been started on Unasyn . Plain radiographs of the right second toe demonstrate soft tissue swelling and gas without acute osseous abnormality.  Non invasive vascular labs have been ordered but results are pending.    Past Medical History:  Diagnosis Date   Essential hypertension    GERD (gastroesophageal reflux disease)    History of kidney stones    Hyperlipidemia    Lumbar disc disease    L3-L5 rupture  May 2016   PONV (postoperative nausea and vomiting)    Sleep apnea    Type 2 diabetes mellitus (HCC)    diagnosed age 65    Past Surgical History:  Procedure Laterality Date   ABDOMINAL AORTOGRAM W/LOWER EXTREMITY N/A 12/09/2023   Procedure: ABDOMINAL AORTOGRAM W/LOWER EXTREMITY;  Surgeon: Serene Gaile ORN, MD;  Location: [facility];  Service: Cardiovascular;  Laterality: N/A;   AMPUTATION TOE Right 12/24/2023   Procedure: AMPUTATION, TOE;  Surgeon: Malvin Marsa FALCON, DPM;  Location: [facility] OR;  Service: Orthopedics/Podiatry;  Laterality: Right;  Right hallux and 5th toe amp   ANKLE ARTHROSCOPY Right 2006   ANKLE RECONSTRUCTION Left    lt reconstruction and rt scope sx   APPENDECTOMY  2000   BIOPSY  09/19/2016   Procedure: BIOPSY;  Surgeon: Lamar CHRISTELLA Hollingshead, MD;  Location: [facility];  Service: Endoscopy;;  colon   COLON SURGERY  02/1999   removal of 8 inches due to perforation.   COLONOSCOPY WITH PROPOFOL  N/A 09/19/2016   Procedure: COLONOSCOPY WITH PROPOFOL ;  Surgeon: Lamar CHRISTELLA Hollingshead, MD;  Location: [facility];  Service: Endoscopy;  Laterality: N/A;  930    LUMBAR DISC SURGERY     July 2017 - Dr. Gaither   PERIPHERAL VASCULAR BALLOON ANGIOPLASTY  12/09/2023   Procedure: PERIPHERAL VASCULAR BALLOON ANGIOPLASTY;  Surgeon: Serene Gaile ORN, MD;  Location: [facility];  Service: Cardiovascular;;  RT SFA, RT  Common femoral   SHOULDER ARTHROSCOPY WITH SUBACROMIAL DECOMPRESSION AND OPEN ROTATOR C Right 02/25/2013   Procedure: RIGHT SHOULDER ARTHROSCOPY WITH SUBACROMIAL DECOMPRESSION AND MINI OPEN ROTATOR CUFF REPAIR;  Surgeon: Reyes JAYSON Billing, MD;  Location: [facility];  Service: Orthopedics;  Laterality: Right;   WISDOM TOOTH EXTRACTION  2011   WISDOM TOOTH EXTRACTION Bilateral     Allergies  Allergen Reactions   Ventolin  [Albuterol ] Swelling    Immediate lip swelling   Vancomycin  Other (See Comments)    Vancomycin  infusion reaction with moderate symptoms    Prior to  Admission medications   Medication Sig Start Date End Date Taking? Authorizing Provider  acetaminophen  (TYLENOL ) 650 MG CR tablet Take 650 mg by mouth every 8 (eight) hours as needed for pain.   Yes [provider]  Aromatic Inhalants (VICKS VAPOR INHALER IN) Inhale 1 puff into the lungs 2 (two) times daily as needed (congestion). Nasal spray   Yes [provider]  aspirin  EC 81 MG tablet Take 1 tablet (81 mg total) by mouth daily. Swallow whole. 12/09/23  Yes Patsy Lenis, MD  atorvastatin  (LIPITOR) 40 MG tablet Take 1 tablet (40 mg total) by mouth daily. 12/10/23  Yes Patsy Lenis, MD  clopidogrel  (PLAVIX ) 75 MG tablet Take 1 tablet (75 mg total) by mouth daily with breakfast. 12/10/23  Yes Patsy Lenis, MD  gabapentin  (NEURONTIN ) 300 MG capsule Take 300 mg by mouth 3 (three) times daily. 12/16/23  Yes [provider]  metFORMIN  (GLUCOPHAGE ) 1000 MG tablet TAKE 1 Tablet  BY MOUTH TWICE DAILY WITH A MEAL Patient taking differently: Take 500 mg by mouth 2 (two) times daily. 01/06/19  Yes Comer Kirsch, PA-C    Social History   Socioeconomic History   Marital status: Married    Spouse name: Not on file   Number of children: Not on file   Years of education: Not on file   Highest education level: Not on file  Occupational History   Not on file  Tobacco Use   Smoking status: Every Day    Current packs/day: 1.00    Average packs/day: 1 pack/day for 31.0 years (31.0 ttl pk-yrs)    Types: Cigarettes    Start date: 06/13/1993   Smokeless tobacco: Former    Types: Chew    Quit date: 10/07/1993  Vaping Use   Vaping status: Never Used  Substance and Sexual Activity   Alcohol use: No   Drug use: No   Sexual activity: Yes    Birth control/protection: None  Other Topics Concern   Not on file  Social History Narrative   Not on file   Social Drivers of Health   Financial Resource Strain: Not on file  Food Insecurity: No Food Insecurity (06/04/2024)   Hunger Vital  Sign    Worried About Running Out of Food in the Last Year: Never true    Ran Out of Food in the Last Year: Never true  Transportation Needs: No Transportation Needs (06/04/2024)   PRAPARE - Administrator, Civil Service (Medical): No    Lack of Transportation (Non-Medical): No  Physical Activity: Not on file  Stress: Not on file  Social Connections: Not on file  Intimate Partner Violence: Not At Risk (06/04/2024)   Humiliation, Afraid, Rape, and Kick questionnaire    Fear of Current or Ex-Partner: No    Emotionally Abused: No    Physically Abused: No    Sexually Abused: No     Family History  Problem Relation Age of Onset   Heart attack Mother    Hypertension Mother    Diabetes Mellitus II Mother    COPD Father    COPD Maternal Grandmother    Colon cancer Maternal Grandmother    Asthma Sister     ROS: Otherwise negative unless mentioned in HPI  Physical Examination  Vitals:   06/04/24 0501 06/04/24 0759  BP: 126/75 116/71  Pulse: 70   Resp: 20 19  Temp: 98.3 F (36.8 C) 97.6 F (36.4 C)  SpO2: 97% 94%   Body mass index is 23.21 kg/m.  General:  WDWN in NAD Gait: Not observed HENT: WNL, normocephalic Pulmonary: normal non-labored breathing, without wheezing Cardiac: regular Abdomen:  soft Vascular Exam/Pulses: 2+ femoral pulses, palpable right DP and PT pulses, foot warm and well perfused. Well healed 1st and 5th toe amputation sites. Right distal 2nd toe with blistering  Extremities: without ischemic changes, without Gangrene  Musculoskeletal: no muscle wasting or atrophy  Neurologic: A&O X 3;  No focal weakness or paresthesias are detected; speech is fluent/normal Psychiatric:  The pt has Normal affect. Lymph:  Unremarkable  CBC    Component Value Date/Time   WBC 12.7 (H) 06/04/2024 0206   RBC 4.21 (L) 06/04/2024 0206   HGB 12.6 (L) 06/04/2024 0206   HCT 36.5 (L) 06/04/2024 0206   PLT 191 06/04/2024 0206   MCV 86.7 06/04/2024 0206   MCH  29.9 06/04/2024 0206   MCHC 34.5 06/04/2024 0206   RDW 12.5 06/04/2024 0206   LYMPHSABS 4.2 (H) 06/03/2024 1834   MONOABS 0.9 06/03/2024 1834   EOSABS 0.5 06/03/2024 1834   BASOSABS 0.1 06/03/2024 1834    BMET    Component Value Date/Time   NA 137 06/04/2024 0206   K 3.4 (L) 06/04/2024 0206   CL 100 06/04/2024 0206   CO2 27 06/04/2024 0206   GLUCOSE 149 (H) 06/04/2024 0206   BUN 6 06/04/2024 0206   CREATININE 0.80 06/04/2024 0206   CALCIUM  8.7 (L) 06/04/2024 0206   GFRNONAA >60 06/04/2024 0206   GFRAA >60 02/25/2020 0834    COAGS: Lab Results  Component Value Date   INR 0.9 12/07/2023     Non-Invasive Vascular Imaging:   +-------+-----------+------------+------------+---------------------------+   ABI/TBIToday's ABIToday's TBI Previous ABIPrevious TBI                  +-------+-----------+------------+------------+---------------------------+   Right 0.77       0.63 2nd toe0.98        great toe amp, 2nd toe  0.82  +-------+-----------+------------+------------+---------------------------+   Left  0.92       0.63        0.89        0.88                          +-------+-----------+------------+------------+---------------------------+    VAS US  Aorta/IVC/ Iliacs: Ordered and Pending  VAS US  Lower Extremity Arterial Duplex Right: Ordered and Pending  Statin:  Yes.   Beta Blocker:  No. Aspirin :  Yes.   ACEI:  No. ARB:  No. CCB use:  No Other antiplatelets/anticoagulants:  Yes.   Plavix    ASSESSMENT/PLAN: This is a 53 y.o. male presenting with a couple day history of right 2nd toe swelling, redness, pain and drainage. He has previously had a right CIA and SFA stent placed in March of this year by Dr. Serene. He has since healed a 4th and 1st toe amputation on  the right foot. On exam he has palpable DP and PT pulses. He is tentatively scheduled for right 2nd toe amputation with Dr. Malvin today in the OR. He is NPO. His non invasive  studies indicate toe pressure of 90 bilaterally which should be adequate for wound healing. His other studies are still pending. Unless there are any severely elevated velocities seen on duplex, I suspect based on ABI and clinical exam that he has adequate perfusion of the right lower extremity to heal a 2nd toe amputation. If there are any issues with wound healing we can plan Angiography at that time.   Teretha Damme PA-C [facility] [phone] 06/04/2024  12:09 PM  VASCULAR STAFF ADDENDUM: I have independently interviewed and examined the patient. I agree with the above.  Status post left partial second toe amputation. Noninvasive testing suggests some in-stent restenosis after peripheral intervention by Dr. Serene in March 2025. We will arrange for an outpatient angiogram with Dr. Arlyss to better evaluate these areas.  He has a palpable pedal pulse and so I think he will heal his toe amputation regardless.  Angiogram is mostly to maintain patency of his intervention.  Reviewed this with the patient he is in agreement.  Safe for discharge from my standpoint.  Continue best medical therapy for atherosclerotic disease.  Debby SAILOR. Magda, MD [facility] Phone Number: [phone] 06/05/2024 10:36 AM

## 2024-06-04 NOTE — Progress Notes (Signed)
 Orthopedic Tech Progress Note Patient Details:  Mark Phillips 01/12/1971 [id number]  Ortho Devices Type of Ortho Device: Postop shoe/boot Ortho Device/Splint Location: RLE Ortho Device/Splint Interventions: Ordered, Application, Adjustment   Post Interventions Patient Tolerated: Well Instructions Provided: Adjustment of device  [name] 06/04/2024, 3:07 PM

## 2024-06-04 NOTE — Transfer of Care (Signed)
 Immediate Anesthesia Transfer of Care Note  Patient: Mark Phillips  Procedure(s) Performed: AMPUTATION, TOE (Right: Toe)  Patient Location: PACU  Anesthesia Type:MAC  Level of Consciousness: awake, alert , and oriented  Airway & Oxygen Therapy: Patient Spontanous Breathing  Post-op Assessment: Report given to RN and Post -op Vital signs reviewed and stable  Post vital signs: Reviewed and stable  Last Vitals:  Vitals Value Taken Time  BP 106/65 06/04/24 14:28  Temp    Pulse 58 06/04/24 14:29  Resp 18 06/04/24 14:29  SpO2 98 % 06/04/24 14:29  Vitals shown include unfiled device data.  Last Pain:  Vitals:   06/04/24 1328  TempSrc: Oral  PainSc:          Complications: No notable events documented.

## 2024-06-04 NOTE — Progress Notes (Signed)
 Transition of Care [facility]) - Inpatient Brief Assessment   Patient Details  Name: Mark Phillips MRN: [id number] Date of Birth: [birth year]  [facility] CM/SW Contact:    Rosaline JONELLE Joe, RN Phone Number: 06/04/2024, 12:36 PM   Clinical Narrative: Cm with IP Care management met with the patient at the bedside.  Patient lives at home with family and plans to return home when stable.  Patient has DME at the home including CPAP on RA, walking boot, post-op shoes from previous surgeries, glucometer.  Patient has no insurance.  Social services resources to be included in AVS.  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  Patient is being followed by vascular surgery for planned surgery today on right foot/toes.  No IP care needs at this time.   Transition of Care Asessment: Insurance and Status: (P) Insurance coverage has been reviewed Patient has primary care physician: (P) Yes Home environment has been reviewed: (P) from home with family Prior level of function:: (P) self Prior/Current Home Services: (P) No current home services Social Drivers of Health Review: (P) SDOH reviewed needs interventions Readmission risk has been reviewed: (P) Yes Transition of care needs: (P) no transition of care needs at this time

## 2024-06-04 NOTE — Op Note (Addendum)
 Full Operative Report  Date of Operation: 1:39 PM, 06/04/2024   Patient: Mark Phillips - 53 y.o. male  Surgeon: Malvin Marsa FALCON, DPM   Assistant: None  Diagnosis: Osteomyelitis Right second toe distal phalanx  Procedure:  1. Amputation of Right second toe through PIPJ    Anesthesia: Anesthesia type not filed in the log.  No responsible provider has been recorded for the case.  No anesthesia staff entered.   Estimated Blood Loss: Minimal   Hemostasis: 1) Anatomical dissection, mechanical compression, electrocautery 2) No tourniquet   Implants: * No implants in log *  Materials: prolene 3-0  Injectables: 1) Pre-operatively: 10 cc of 50:50 mixture 1%lidocaine  plain and 0.5% marcaine  plain 2) Post-operatively: None   Specimens: - Pathology: R 2nd toe for pathology - Microbiology: None   Antibiotics: IV antibiotics given per schedule on the floor  Drains: None  Complications: Patient tolerated the procedure well without complication.   Operative findings: As below in detailed report  Indications for Procedure: Dorrian Doggett presents to Malvin Marsa FALCON, DPM with a chief complaint of redness and swelling with drainage from R 2nd toe , concern for OM on MRI distal phalanx.  The patient has failed conservative treatments of various modalities. At this time the patient has elected to proceed with surgical correction. All alternatives, risks, and complications of the procedures were thoroughly explained to the patient. Patient exhibits appropriate understanding of all discussion points and informed consent was signed and obtained in the chart with no guarantees to surgical outcome given or implied.  Description of Procedure: Patient was brought to the operating room. Patient remained on their hospital bed in the supine position. A surgical timeout was performed and all members of the operating room, the procedure, and the surgical site were  identified. anesthesia occurred as per anesthesia record. Local anesthetic as previously described was then injected about the operative field in a local infiltrative block.  The operative lower extremity as noted above was then prepped and draped in the usual sterile manner. The following procedure then began.  Attention was directed to the 2nd digit on the RIGHT foot. A full-thickness incision encompassing the entire digit was made using a #15 blade. Dissection was carried down to bone. The toe was secured with a towel clamp, further dissected in its entirety, and disarticulated at the PIPJ and passed to the back table as a gross specimen. This was then labled and sent to pathology.  All remaining necrotic and devitalized soft tissue structures were visualized and dissected away using sharp and dull dissection. Care was taken to protect all neurovascular structures throughout the dissection. All bleeders were cauterized as necessary though minimal bleeding was seen from the peri amp site soft tissues.   The area was then flushed with copious amounts of sterile saline. Then using the suture materials previously described, the site was closed in anatomic layers and the skin was well approximated under minimal tension.   The surgical site was then dressed with xeroform 4x4 kerlix and ace wrap. The patient tolerated both the procedure and anesthesia well with vital signs stable throughout. The patient was transferred in good condition and all vital signs stable  from the OR to recovery under the discretion of anesthesia.  Condition: Vital signs stable, neurovascular status unchanged from preoperative   Surgical plan:  Expect clean margin, 5 days Augmentin  from OR, follow up next week Thursday. Discussed with  Dr. Magda post op re poor bleeding from amp site -  may need Angiogram Tuesday - further plan per vascular team. WBAT in post op shoe.   The patient will be WBAT in a post op shoe to the operative  limb until further instructed. The dressing is to remain clean, dry, and intact. Will continue to follow unless noted elsewhere.   Marsa Honour, DPM [facility]

## 2024-06-05 ENCOUNTER — Other Ambulatory Visit (HOSPITAL_COMMUNITY): Payer: Self-pay

## 2024-06-05 DIAGNOSIS — E440 Moderate protein-calorie malnutrition: Secondary | ICD-10-CM | POA: Insufficient documentation

## 2024-06-05 LAB — BASIC METABOLIC PANEL WITH GFR
Anion gap: 9 (ref 5–15)
BUN: 7 mg/dL (ref 6–20)
CO2: 24 mmol/L (ref 22–32)
Calcium: 9.1 mg/dL (ref 8.9–10.3)
Chloride: 105 mmol/L (ref 98–111)
Creatinine, Ser: 0.6 mg/dL — ABNORMAL LOW (ref 0.61–1.24)
GFR, Estimated: >60 mL/min (ref >60)
Glucose, Bld: 108 mg/dL — ABNORMAL HIGH (ref 70–99)
Potassium: 4 mmol/L (ref 3.5–5.1)
Sodium: 138 mmol/L (ref 135–145)

## 2024-06-05 LAB — CBC
HCT: 35.9 % — ABNORMAL LOW (ref 39.0–52.0)
Hemoglobin: 12.4 g/dL — ABNORMAL LOW (ref 13.0–17.0)
MCH: 30.2 pg (ref 26.0–34.0)
MCHC: 34.5 g/dL (ref 30.0–36.0)
MCV: 87.3 fL (ref 80.0–100.0)
Platelets: 197 K/uL (ref 150–400)
RBC: 4.11 MIL/uL — ABNORMAL LOW (ref 4.22–5.81)
RDW: 12.5 % (ref 11.5–15.5)
WBC: 10.6 K/uL — ABNORMAL HIGH (ref 4.0–10.5)
nRBC: 0 % (ref 0.0–0.2)

## 2024-06-05 LAB — GLUCOSE, CAPILLARY
Glucose-Capillary: 128 mg/dL — ABNORMAL HIGH (ref 70–99)
Glucose-Capillary: 111 mg/dL — ABNORMAL HIGH (ref 70–99)
Glucose-Capillary: 103 mg/dL — ABNORMAL HIGH (ref 70–99)
Glucose-Capillary: 211 mg/dL — ABNORMAL HIGH (ref 70–99)

## 2024-06-05 LAB — MAGNESIUM: Magnesium: 1.5 mg/dL — ABNORMAL LOW (ref 1.7–2.4)

## 2024-06-05 LAB — VAS US ABI WITH/WO TBI
Left ABI: 0.92
Right ABI: 0.77

## 2024-06-05 MED ORDER — AMOXICILLIN-POT CLAVULANATE 875-125 MG PO TABS
1.0000 | ORAL_TABLET | Freq: Two times a day (BID) | ORAL | 0 refills | Status: AC
  Filled 2024-06-05: qty 18, 9d supply, fill #0

## 2024-06-05 MED ORDER — OXYCODONE HCL 5 MG PO TABS
5.0000 mg | ORAL_TABLET | ORAL | 0 refills | Status: AC | PRN
  Filled 2024-06-05: qty 15, 3d supply, fill #0

## 2024-06-05 NOTE — Evaluation (Signed)
 Physical Therapy Brief Evaluation and Discharge Note Patient Details Name: Mark Phillips MRN: [id number] DOB: 10-Oct-1970 Today's Date: 06/05/2024   History of Present Illness  Pt is 53 yo presenting to [facility] on 8/28 for redness, swelling, blistering and purulent drainage from R 2nd toe. Currently pt is s/p amputation of R 2nd toe at the PIP joint on 8/29. WBAT in post-op shoe. PMH: HTN, DM II, PAD, GERD, hyperlipidemia, lumbar disc disease.  Clinical Impression  Pt is currently ind for bed mobility, sit to stand and gait without an AD. Overviewed WB precautions and limiting activity on the RLE especially with increased pain. Pt educated on what a significant increase in pain would be and discussed sequencing with steps. Currently pt is presenting at baseline level of functioning and no skilled physical therapy services recommended. Pt will be discharged from skilled physical therapy services at this time; please re-consult if further needs arise.             PT Assessment Patient does not need any further PT services  Assistance Needed at Discharge  PRN    Equipment Recommendations None recommended by PT     Precautions/Restrictions Precautions Precautions: None Recall of Precautions/Restrictions: Intact Restrictions Weight Bearing Restrictions Per Provider Order: Yes RLE Weight Bearing Per Provider Order: Weight bearing as tolerated        Mobility  Bed Mobility   Supine/Sidelying to sit: Independent      Transfers Overall transfer level: Independent Equipment used: None    Ambulation/Gait Ambulation/Gait assistance: Independent Gait Distance (Feet): 150 Feet Assistive device: None Gait Pattern/deviations: WFL(Within Functional Limits), Step-through pattern, Antalgic Gait Speed: Pace WFL General Gait Details: mildly antalgic  Home Activity Instructions Home Activity Instructions: limit time standing on foot and pay attention to pain    Balance Overall  balance assessment: Independent        Pertinent Vitals/Pain   Pain Assessment Pain Assessment: 0-10 Pain Score: 4  Pain Location: operative site Pain Descriptors / Indicators: Aching, Burning, Tingling Pain Intervention(s): Limited activity within patient's tolerance, Monitored during session     Home Living Family/patient expects to be discharged to:: Private residence Living Arrangements: Spouse/significant other;Children;Other relatives Available Help at Discharge: Family;Available PRN/intermittently Home Environment: Level entry   Home Equipment: BSC/3in1;Crutches;Rolling Walker (2 wheels);Cane - single point        Prior Function Level of Independence: Independent      UE/LE Assessment   UE ROM/Strength/Tone/Coordination: WFL    LE ROM/Strength/Tone/Coordination: [facility]      Communication   Communication Communication: No apparent difficulties     Cognition Overall Cognitive Status: Appears within functional limits for tasks assessed/performed       General Comments General comments (skin integrity, edema, etc.): Incision dressed, no signs of drainage        Assessment/Plan           No Skilled PT Patient at baseline level of functioning;Patient is independent with all acitivity/mobility    AMPAC 6 Clicks Help needed turning from your back to your side while in a flat bed without using bedrails?: None Help needed moving from lying on your back to sitting on the side of a flat bed without using bedrails?: None Help needed moving to and from a bed to a chair (including a wheelchair)?: None Help needed standing up from a chair using your arms (e.g., wheelchair or bedside chair)?: None Help needed to walk in hospital room?: None Help needed climbing 3-5 steps with a railing? : None  6 Click Score: 24      End of Session   Activity Tolerance: Patient tolerated treatment well Patient left: Other (comment) (in room by bed. Pt standing. no  concerns.) Nurse Communication: Mobility status       Time: 9147-9095 PT Time Calculation (min) (ACUTE ONLY): 12 min  Charges:   PT Evaluation $PT Eval Low Complexity: 1 Low      Dorothyann Maier, DPT, CLT  Acute Rehabilitation Services Office: [phone] (Secure chat preferred)   Dorothyann VEAR Maier  06/05/2024, 9:14 AM

## 2024-06-05 NOTE — Progress Notes (Signed)
  Subjective:  Patient ID: Mark Phillips, male    DOB: Jul 10, 1971,  MRN: [id number]  POD #1 right second toe partial amputation  Feeling well not having any pain.  Dressing changed this morning by vascular surgery.  Negative for chest pain and shortness of breath Fever: no Night sweats: no Constitutional signs: no Review of all other systems is negative Objective:   Vitals:   06/05/24 0346 06/05/24 0717  BP: 135/80 138/85  Pulse: 68 69  Resp: 17 18  Temp: 98.3 F (36.8 C) 98.8 F (37.1 C)  SpO2: 95% 95%   General AA&O x3. Normal mood and affect.  Vascular Foot is warm to touch, capillary fill time is intact  Neurologic Epicritic sensation grossly reduced.  Dermatologic Dressing clean dry and intact  Orthopedic: MMT 5/5 in dorsiflexion, plantarflexion, inversion, and eversion. Normal joint ROM without pain or crepitus.    Assessment & Plan:  Patient was evaluated and treated and all questions answered.  POD #1 right partial second toe amputation - Case discussed with vascular surgery.  They will plan for angiography as an outpatient as current flow should be sufficient for healing.  Considering this reasonable to discharge at this time -Weight-bear as tolerated in surgical shoe. -Can discharge on Augmentin  x 10 days 875/125 mg twice daily -No further dressing changes until outpatient follow-up.  Follow-up with Dr. Malvin on Thursday.  The office will call for follow-up on Tuesday  [facility], DPM  Accessible via secure chat for questions or concerns.

## 2024-06-05 NOTE — Progress Notes (Signed)
 OT Cancellation Note  Patient Details Name: Mark Phillips MRN: [id number] DOB: 08-05-1971   Cancelled Treatment:    Reason Eval/Treat Not Completed: OT screened, no needs identified, will sign off.   Per PT, pt is independent with no acute needs.   [name], OTR/L [facility] Acute Rehabilitation Office: [phone]   Elma JONETTA Lebron 06/05/2024, 10:23 AM

## 2024-06-05 NOTE — Discharge Summary (Signed)
 Physician Discharge Summary  Mark Phillips FMW:[id number] DOB: 01/15/1971 DOA: 06/03/2024  PCP: [facility]  Admit date: 06/03/2024 Discharge date: 06/05/2024 30 Day Unplanned Readmission Risk Score    Flowsheet Row ED to Hosp-Admission (Current) from 06/03/2024 in [place] [facility] Unit  30 Day Unplanned Readmission Risk Score (%) 14.99 Filed at 06/05/2024 1200    This score is the patient's risk of an unplanned readmission within 30 days of being discharged (0 -100%). The score is based on dignosis, age, lab data, medications, orders, and past utilization.   Low:  0-14.9   Medium: 15-21.9   High: 22-29.9   Extreme: 30 and above          Admitted From: Home Disposition: Home   Recommendations for Outpatient Follow-up:  Follow up with PCP in 1-2 weeks Please obtain BMP/CBC in one week Follow-up with podiatry Dr. Urban on next week Thursday Follow-up with vascular surgery for angiogram on Tuesday next week Please follow up with your PCP on the following pending results: Unresulted Labs (From admission, onward)     Start     Ordered   06/05/24 0805  C Difficile Quick Screen w PCR reflex  (C Difficile quick screen w PCR reflex panel )  Once, for 24 hours,   TIMED       References:    CDiff Information Tool   06/05/24 0804   06/05/24 0500  Zinc   Tomorrow morning,   R        06/04/24 1416   06/04/24 0500  Basic metabolic panel  Daily,   R      06/03/24 2140   06/04/24 0500  CBC  Daily,   R      06/03/24 2140              Home Health: None Equipment/Devices: None none  Discharge Condition: Stable CODE STATUS: Full code Diet recommendation:  Diet Order             Diet Carb Modified Fluid consistency: Thin  Diet effective now                   Subjective: Seen and examined, very irritable and upset for being in the hospital and wants to go home.  We were waiting for vascular surgery to formulate the plan.  They planned  angiogram of the right lower extremity on Tuesday and initially asked me to keep him here but patient strongly desires to go home so they have cleared the patient to go home and they will arrange this outpatient on Tuesday.  Brief/Interim Summary: Mark Phillips is a 53 y.o. male with past medical history significant for hypertension, type 2 diabetes mellitus, and PAD presented to the hospital with redness, swelling, blistering and drainage from the right second toe.  Of note patient had undergone stenting of the right SFA and right CIA in March 2025 followed by amputation of the 1st and 5th toes later that month.  Patient has recently experienced redness, swelling, blistering, and purulent drainage from the right second toe. In the ED patient was afebrile.  Labs are notable for WBC 10,900, and normal hemoglobin.  Plain radiographs of the right second toe demonstrate soft tissue swelling and gas without acute osseous abnormality.  Podiatry was consulted from the ED, blood cultures were collected and patient was started on vancomycin  and Unasyn  and was admitted hospital for further evaluation and treatment.  Eventually transferred to [facility] to  have surgery by podiatry.  Details below.   Right 2nd toe infection  Foot x-ray without any acute findings.  MRI of the toes showing acute osteomyelitis of the second distal phalanx with overlying soft tissue ulceration and skin blister, status post amputation of the great toe phalanges.  Amputation of the fifth digit phalanges.  Continued on Unasyn  and linezolid .  Transferred to [facility].  Seen by podiatry.  Underwent amputation of the right second toe through PIPJ by Dr. Malvin on 06/04/2024.  Reassessed by podiatry today, they cleared him for discharge and recommended Augmentin  for total 10 days, he only received 1 days, did prescribe him for 9 days.    PAD   He has previously had a right CIA and SFA stent placed in March of this year by Dr.  Serene. He has since healed a 4th and 1st toe amputation on the right foot. On exam he has palpable DP and PT pulses. His non invasive studies indicate toe pressure of 90 bilaterally which should be adequate for wound healing.  Vascular surgery plans to do angiogram of the right lower extremity outpatient on Tuesday.   Type II DM  Hemoglobin A1c was 7.0% in May 2025.  Resume metformin .   Mild hypokalemia.  Resolved      Discharge plan was discussed with patient and/or family member and they verbalized understanding and agreed with it.  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   amoxicillin -clavulanate 875-125 MG tablet Commonly known as: AUGMENTIN  Take 1 tablet by mouth 2 (two) times daily for 9 days.   aspirin  EC 81 MG tablet Take 1 tablet (81 mg total) by mouth daily. Swallow whole.   atorvastatin  40 MG tablet Commonly known as: LIPITOR Take 1 tablet (40 mg total) by mouth daily.   clopidogrel  75 MG tablet Commonly known as: PLAVIX  Take 1 tablet (75 mg total) by mouth daily with breakfast.   gabapentin  300 MG capsule Commonly known as: NEURONTIN  Take 300 mg by mouth 3 (three) times daily.   metFORMIN  1000 MG tablet Commonly known as: GLUCOPHAGE  TAKE 1 Tablet  BY MOUTH TWICE DAILY WITH A MEAL What changed:   how much to take when to take this   oxyCODONE  5 MG immediate release tablet Commonly known as: Oxy IR/ROXICODONE  Take 1 tablet (5 mg total) by mouth every 4 (four) hours as needed for moderate pain (pain score 4-6).   VICKS VAPOR INHALER IN Inhale 1 puff into the lungs 2 (two) times daily as needed (congestion). Nasal spray        Follow-up Information     [facility] Follow up in 1 week(s).   Contact information: [address] [place] [address] [phone]  Standiford, Marsa FALCON, DPM Follow up on 06/10/2024.   Specialty: Actuary information: [address] [place] [address] [phone]                Allergies  Allergen Reactions   Ventolin  [Albuterol ] Swelling    Immediate lip swelling   Vancomycin  Other (See Comments)    Vancomycin  infusion reaction with moderate symptoms    Consultations: Podiatry and vascular surgery   Procedures/Studies: VAS US  ABI WITH/WO TBI Result Date: 06/05/2024  LOWER EXTREMITY DOPPLER STUDY Patient Name:  Jabe Jeanbaptiste  Date of Exam:   06/04/2024 Medical Rec #: [id number]            Accession #:    [id number] Date of Birth: 06/29/1971            Patient Gender: M Patient Age:   53 years Exam Location:  [facility] Procedure:      VAS US  ABI WITH/WO TBI Referring Phys: MARSA HONOUR --------------------------------------------------------------------------------  Indications: Ulceration, and peripheral artery disease. Right great toe and 5th              toe amputations 12/24/23 High Risk Factors: Hypertension, Diabetes, current smoker.  Vascular Interventions: Right SFA and common iliac stenting 12/09/23. Comparison Study: Prior ABI done 03/02/24 Performing Technologist: Rachel Pellet RVS  Examination Guidelines: A complete evaluation includes at minimum, Doppler waveform signals and systolic blood pressure reading at the level of bilateral brachial,  anterior tibial, and posterior tibial arteries, when vessel segments are accessible. Bilateral testing is considered an integral part of a complete examination. Photoelectric Plethysmograph (PPG) waveforms and toe systolic pressure readings are included as required and additional duplex testing as needed. Limited examinations for reoccurring indications may be performed as noted.  ABI Findings: +---------+------------------+-----+----------+--------------------------+ Right    Rt Pressure (mmHg)IndexWaveform  Comment                    +---------+------------------+-----+----------+--------------------------+ Brachial 142                    triphasic                            +---------+------------------+-----+----------+--------------------------+ PTA      90                0.63 biphasic                             +---------+------------------+-----+----------+--------------------------+ DP       110               0.77 monophasic                           +---------+------------------+-----+----------+--------------------------+ Great Toe90                0.63           second toe (great toe amp) +---------+------------------+-----+----------+--------------------------+ +---------+------------------+-----+----------+-------+ Left     Lt Pressure (mmHg)IndexWaveform  Comment +---------+------------------+-----+----------+-------+ Brachial 142                    triphasic         +---------+------------------+-----+----------+-------+ PTA      130               0.92 monophasic        +---------+------------------+-----+----------+-------+ DP  122               0.86 biphasic          +---------+------------------+-----+----------+-------+ Great Toe90                0.63                   +---------+------------------+-----+----------+-------+ +-------+-----------+------------+------------+---------------------------+ ABI/TBIToday's ABIToday's TBI  Previous ABIPrevious TBI                +-------+-----------+------------+------------+---------------------------+ Right  0.77       0.63 2nd toe0.98        great toe amp, 2nd toe 0.82 +-------+-----------+------------+------------+---------------------------+ Left   0.92       0.63        0.89        0.88                        +-------+-----------+------------+------------+---------------------------+ Right ABIs and bilateral TBIs appear decreased compared to prior study on 03/02/2024. Left ABIs appear essentially unchanged compared to prior study on 03/02/2024.  Summary: Right: Resting right ankle-brachial index indicates moderate right lower extremity arterial disease. The right toe-brachial index is abnormal.  2nd toe used for TBI. Left: Resting left ankle-brachial index indicates mild left lower extremity arterial disease. The left toe-brachial index is abnormal.  *See table(s) above for measurements and observations.  Electronically signed by Debby Robertson on 06/05/2024 at 12:10:38 PM.    Final    VAS US  LOWER EXTREMITY ARTERIAL DUPLEX Result Date: 06/05/2024 LOWER EXTREMITY ARTERIAL DUPLEX STUDY Patient Name:  Mark Phillips  Date of Exam:   06/04/2024 Medical Rec #: [id number]            Accession #:    [id number] Date of Birth: 1971-05-28            Patient Gender: M Patient Age:   39 years Exam Location:  [facility] Procedure:      VAS US  LOWER EXTREMITY ARTERIAL DUPLEX Referring Phys: MARSA HONOUR --------------------------------------------------------------------------------  Indications: Peripheral artery disease, and DM foot infection. High Risk Factors: Hypertension, hyperlipidemia, Diabetes, current smoker. Other Factors: RLE 1st and 5th toe amputation of 12/24/2023.  Vascular Interventions: RLE CIA & SFA stent placement on 12/09/2023. Current ABI:            RT 0.77 LT 0.92 Comparison Study: Previous exam on 03/02/2024 showed 50-99% at the pre and                    proximal stent segments (low end of range) Performing Technologist: Ezzie Potters RVT, RDMS  Examination Guidelines: A complete evaluation includes B-mode imaging, spectral Doppler, color Doppler, and power Doppler as needed of all accessible portions of each vessel. Bilateral testing is considered an integral part of a complete examination. Limited examinations for reoccurring indications may be performed as noted.  +-----------+--------+-----+---------------+----------+--------+ RIGHT      PSV cm/sRatioStenosis       Waveform  Comments +-----------+--------+-----+---------------+----------+--------+ CFA Prox   143                         biphasic           +-----------+--------+-----+---------------+----------+--------+ CFA Mid    97                          biphasic           +-----------+--------+-----+---------------+----------+--------+  CFA Distal 95                          biphasic           +-----------+--------+-----+---------------+----------+--------+ DFA        110                         triphasic          +-----------+--------+-----+---------------+----------+--------+ SFA Prox   79                          biphasic           +-----------+--------+-----+---------------+----------+--------+ SFA Distal 63                          monophasic         +-----------+--------+-----+---------------+----------+--------+ POP Prox   50                          monophasic         +-----------+--------+-----+---------------+----------+--------+ POP Mid    68                          monophasic         +-----------+--------+-----+---------------+----------+--------+ POP Distal 337          75-99% stenosismonophasic         +-----------+--------+-----+---------------+----------+--------+ TP Trunk   57                          monophasic         +-----------+--------+-----+---------------+----------+--------+ ATA Prox   40                           monophasic         +-----------+--------+-----+---------------+----------+--------+ ATA Mid    30                          monophasic         +-----------+--------+-----+---------------+----------+--------+ ATA Distal 33                          monophasic         +-----------+--------+-----+---------------+----------+--------+ PTA Prox   35                          monophasic         +-----------+--------+-----+---------------+----------+--------+ PTA Mid    35                          monophasic         +-----------+--------+-----+---------------+----------+--------+ PTA Distal 33                          monophasic         +-----------+--------+-----+---------------+----------+--------+ PERO Prox  16                          monophasic         +-----------+--------+-----+---------------+----------+--------+ PERO Mid   20  monophasic         +-----------+--------+-----+---------------+----------+--------+ PERO Distal22                          monophasic         +-----------+--------+-----+---------------+----------+--------+ DP         29                          monophasic         +-----------+--------+-----+---------------+----------+--------+  Right Stent(s): +---------------+---+---------------+----------+-----------------------+ Prox to Stent  42850-99% stenosismonophasichypoechoic/heterogenous +---------------+---+---------------+----------+-----------------------+ Proximal Stent 72                monophasicpost-stenotic waveform  +---------------+---+---------------+----------+-----------------------+ Mid Stent      23                monophasic                        +---------------+---+---------------+----------+-----------------------+ Distal Stent   20                monophasic                        +---------------+---+---------------+----------+-----------------------+ Distal to  Stent39                monophasichypoechoic/heterogenous +---------------+---+---------------+----------+-----------------------+    Summary: Right: 75-99% stenosis noted in the popliteal artery. RLE SFA stent is patent, however there is a 50-99% (high end of ranger) stenosis just proximal to the stent. Hypoechoic and heterogenous plaque seen throughout CFA, SFA, and PopA.  See table(s) above for measurements and observations. Electronically signed by Debby Robertson on 06/05/2024 at 12:10:25 PM.    Final    VAS US  AORTA/IVC/ILIACS Result Date: 06/05/2024 ABDOMINAL AORTA STUDY Patient Name:  Mark Phillips  Date of Exam:   06/04/2024 Medical Rec #: [id number]            Accession #:    [id number] Date of Birth: Oct 10, 1970            Patient Gender: M Patient Age:   47 years Exam Location:  [facility] Procedure:      VAS US  AORTA/IVC/ILIACS Referring Phys: MARSA HONOUR --------------------------------------------------------------------------------  Risk Factors: Hypertension, hyperlipidemia, Diabetes, current smoker. Other Factors: Amputation of RLE 1st and 5th toe 12/24/2023. Vascular Interventions: RLE CIA & SFA stent placement on 12/09/2023. Limitations: Air/bowel gas and patient movement.  Comparison       Previous exam 03/02/2024 - no evidence of stenosis within CIA Study:           stent Performing Technologist: Ezzie Potters RVT, RDMS  Examination Guidelines: A complete evaluation includes B-mode imaging, spectral Doppler, color Doppler, and power Doppler as needed of all accessible portions of each vessel. Bilateral testing is considered an integral part of a complete examination. Limited examinations for reoccurring indications may be performed as noted.  Abdominal Aorta Findings: +-------------+-------+----------+----------+--------+--------+--------+ Location     AP (cm)Trans (cm)PSV (cm/s)WaveformThrombusComments  +-------------+-------+----------+----------+--------+--------+--------+ Proximal     1.73   1.99      55        biphasic                 +-------------+-------+----------+----------+--------+--------+--------+ Mid          1.57   1.61      68        biphasic                 +-------------+-------+----------+----------+--------+--------+--------+  Distal       1.65   1.92      71        biphasic                 +-------------+-------+----------+----------+--------+--------+--------+ RT CIA Prox  0.7    0.9                                 stent    +-------------+-------+----------+----------+--------+--------+--------+ RT CIA Mid                                              stent    +-------------+-------+----------+----------+--------+--------+--------+ RT CIA Distal                                           stent    +-------------+-------+----------+----------+--------+--------+--------+ RT EIA Prox                   107       biphasic                 +-------------+-------+----------+----------+--------+--------+--------+ RT EIA Mid                    109       biphasic                 +-------------+-------+----------+----------+--------+--------+--------+ RT EIA Distal0.6    0.6       121       biphasic                 +-------------+-------+----------+----------+--------+--------+--------+ Limited visualization of prox and mid EIA - unable to obtain diameter. IVC/Iliac Findings: +----------+------+--------+--------+    IVC    PatentThrombusComments +----------+------+--------+--------+ IVC Prox  patent                 +----------+------+--------+--------+ IVC Mid   patent                 +----------+------+--------+--------+ IVC Distalpatent                 +----------+------+--------+--------+   Right Stent(s): +---------------+--------+-----------+--------+--------+ RT CIA         PSV cm/sStenosis    WaveformComments +---------------+--------+-----------+--------+--------+ Prox to Stent  102     no stenosisbiphasic         +---------------+--------+-----------+--------+--------+ Proximal Stent 87      no stenosisbiphasicaudibly  +---------------+--------+-----------+--------+--------+ Mid Stent      80      no stenosisbiphasicaudibly  +---------------+--------+-----------+--------+--------+ Distal Stent   75      no stenosisbiphasicaudibly  +---------------+--------+-----------+--------+--------+ Distal to Stent118     no stenosisbiphasic         +---------------+--------+-----------+--------+--------+    Summary: Stenosis: +--------------------+-----------+-----------+ Location            Stent      Comments    +--------------------+-----------+-----------+ Prox Aorta                     no stenosis +--------------------+-----------+-----------+ Mid Aorta                      no stenosis +--------------------+-----------+-----------+ Distal Aorta  no stenosis +--------------------+-----------+-----------+ Right Common Iliac  no stenosis            +--------------------+-----------+-----------+ Right External Iliac           no stenosis +--------------------+-----------+-----------+   *See table(s) above for measurements and observations.  Electronically signed by Debby Robertson on 06/05/2024 at 12:09:39 PM.    Final    DG Foot 2 Views Right Result Date: 06/04/2024 CLINICAL DATA:  Postoperative state. EXAM: RIGHT FOOT - 2 VIEW COMPARISON:  June 03, 2024. FINDINGS: Status post interval surgical resection of the second middle and distal phalanges. Previous first and second toe amputations are again noted. No dislocation is noted. IMPRESSION: Status post interval surgical resection of second middle and distal phalanges. Electronically Signed   By: Lynwood Landy Raddle M.D.   On: 06/04/2024 15:34   MR TOES RIGHT WO CONTRAST Result Date:  06/04/2024 CLINICAL DATA:  Right second toe infection.  History of diabetes. EXAM: MRI OF THE RIGHT TOES WITHOUT CONTRAST TECHNIQUE: Multiplanar, multisequence MR imaging of the right forefoot was performed. No intravenous contrast was administered. COMPARISON:  Right second toe radiographs dated 06/03/2024. FINDINGS: Bones/Joint/Cartilage T2/STIR hyperintense and T1 hypointense marrow signal abnormality of the second distal phalanx, compatible with acute osteomyelitis. Overlying soft tissue ulceration and skin blister. No marrow signal abnormality identified elsewhere to suggest osteomyelitis. Status post amputation of the great toe distal and proximal phalanges. Serpiginous signal abnormality of the first metatarsal head likely reflects osteonecrosis. Status post amputation of the fifth digit phalanges. No joint effusion. Ligaments Lisfranc ligament is intact. Muscles and Tendons No significant tenosynovitis. Mildly increased T2 signal in the intrinsic foot musculature, may reflect chronic denervation changes, however, myositis cannot be excluded. Soft tissue Soft tissue ulceration and skin blistering of the distal second digit with surrounding edema. No loculated fluid collection. IMPRESSION: 1. Acute osteomyelitis of second distal phalanx with overlying soft tissue ulceration and skin blister. No abscess. 2. Status post amputation of the great toe phalanges. 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 3. Status post amputation of the fifth digit phalanges. 4. Mildly increased T2 signal in the intrinsic foot musculature, may reflect chronic denervation changes, however, myositis cannot be excluded. Electronically Signed   By: Harrietta Sherry M.D.   On: 06/04/2024 10:23   DG Toe 2nd Right Result Date: 06/03/2024 CLINICAL DATA:  Pain infection.  Infection to the second right toe. EXAM: RIGHT SECOND TOE COMPARISON:  Right foot 12/24/2023 FINDINGS: Postoperative changes with previous  amputation of the right first toe at the metatarsal-phalangeal level. Soft tissue swelling of the second toe. Small soft tissue gas bubble in the distal soft tissues of the second toe beneath the nail bed. This suggest infection. No radiopaque soft tissue foreign bodies. Bones appear intact. No sclerosis or bone destruction to suggest osteomyelitis. Joint spaces are normal. IMPRESSION: Soft tissue swelling and soft tissue gas to the right second toe suggesting soft tissue infection. No radiographic evidence of osteomyelitis. Electronically Signed   By: Elsie Gravely M.D.   On: 06/03/2024 18:03     Discharge Exam: Vitals:   06/05/24 0717 06/05/24 1141  BP: 138/85 (!) 141/85  Pulse: 69 73  Resp: 18 16  Temp: 98.8 F (37.1 C) 98.7 F (37.1 C)  SpO2: 95% 100%   Vitals:   06/04/24 2316 06/05/24 0346 06/05/24 0717 06/05/24 1141  BP: 133/78 135/80 138/85 (!) 141/85  Pulse: 67 68 69 73  Resp: 18 17 18 16   Temp: 98.3 F (36.8  C) 98.3 F (36.8 C) 98.8 F (37.1 C) 98.7 F (37.1 C)  TempSrc:    Oral  SpO2: 97% 95% 95% 100%  Weight:      Height:        General: Pt is alert, awake, not in acute distress Cardiovascular: RRR, S1/S2 +, no rubs, no gallops Respiratory: CTA bilaterally, no wheezing, no rhonchi Abdominal: Soft, NT, ND, bowel sounds + Extremities: no edema, no cyanosis, dressing in the right foot.    The results of significant diagnostics from this hospitalization (including imaging, microbiology, ancillary and laboratory) are listed below for reference.     Microbiology: Recent Results (from the past 240 hours)  Blood culture (routine x 2)     Status: None (Preliminary result)   Collection Time: 06/03/24  7:18 PM   Specimen: BLOOD  Result Value Ref Range Status   Specimen Description BLOOD BLOOD RIGHT ARM  Final   Special Requests   Final    BOTTLES DRAWN AEROBIC AND ANAEROBIC Blood Culture adequate volume   Culture   Final    NO GROWTH 2 DAYS Performed at [facility], [address], [place], [place] [address]    Report Status PENDING  Incomplete  Blood culture (routine x 2)     Status: None (Preliminary result)   Collection Time: 06/03/24  7:18 PM   Specimen: BLOOD  Result Value Ref Range Status   Specimen Description BLOOD BLOOD LEFT ARM  Final   Special Requests   Final    BOTTLES DRAWN AEROBIC AND ANAEROBIC Blood Culture adequate volume   Culture   Final    NO GROWTH 2 DAYS Performed at [facility], [address], [place], [place] [address]    Report Status PENDING  Incomplete     Labs: BNP (last 3 results) No results for input(s): BNP in the last 8760 hours. Basic Metabolic Panel: Recent Labs  Lab 06/03/24 1834 06/04/24 0206 06/05/24 0519  NA 137 137 138  K 4.0 3.4* 4.0  CL 99 100 105  CO2 25 27 24   GLUCOSE 116* 149* 108*  BUN 9 6 7   CREATININE 0.71 0.80 0.60*  CALCIUM  9.4 8.7* 9.1  MG  --   --  1.5*   Liver Function Tests: No results for input(s): AST, ALT, ALKPHOS, BILITOT, PROT, ALBUMIN in the last 168 hours. No results for input(s): LIPASE, AMYLASE in the last 168 hours. No results for input(s): AMMONIA in the last 168 hours. CBC: Recent Labs  Lab 06/03/24 1834 06/04/24 0206 06/05/24 0519  WBC 10.9* 12.7* 10.6*  NEUTROABS 5.2  --   --   HGB 13.8 12.6* 12.4*  HCT 40.8 36.5* 35.9*  MCV 88.7 86.7 87.3  PLT 208 191 197   Cardiac Enzymes: No results for input(s): CKTOTAL, CKMB, CKMBINDEX, TROPONINI in the last 168 hours. BNP: Invalid input(s): POCBNP CBG: Recent Labs  Lab 06/04/24 2009 06/04/24 2318 06/05/24 0347 06/05/24 0720 06/05/24 1137  GLUCAP 131* 134* 111* 103* 211*   D-Dimer No results for input(s): DDIMER in the last 72 hours. Hgb A1c No results for input(s): HGBA1C in the last 72 hours. Lipid Profile No results for input(s): CHOL, HDL, LDLCALC, TRIG, CHOLHDL, LDLDIRECT in the last 72 hours. Thyroid function studies No results for input(s):  TSH, T4TOTAL, T3FREE, THYROIDAB in the last 72 hours.  Invalid input(s): FREET3 Anemia work up No results for input(s): VITAMINB12, FOLATE, FERRITIN, TIBC, IRON, RETICCTPCT in the last 72 hours. Urinalysis No results found for: COLORURINE, APPEARANCEUR,  LABSPEC, PHURINE, GLUCOSEU, HGBUR, BILIRUBINUR, KETONESUR, PROTEINUR, UROBILINOGEN, NITRITE, LEUKOCYTESUR Sepsis Labs Recent Labs  Lab 06/03/24 1834 06/04/24 0206 06/05/24 0519  WBC 10.9* 12.7* 10.6*   Microbiology Recent Results (from the past 240 hours)  Blood culture (routine x 2)     Status: None (Preliminary result)   Collection Time: 06/03/24  7:18 PM   Specimen: BLOOD  Result Value Ref Range Status   Specimen Description BLOOD BLOOD RIGHT ARM  Final   Special Requests   Final    BOTTLES DRAWN AEROBIC AND ANAEROBIC Blood Culture adequate volume   Culture   Final    NO GROWTH 2 DAYS Performed at [facility], [address] [place], [place] [address]    Report Status PENDING  Incomplete  Blood culture (routine x 2)     Status: None (Preliminary result)   Collection Time: 06/03/24  7:18 PM   Specimen: BLOOD  Result Value Ref Range Status   Specimen Description BLOOD BLOOD LEFT ARM  Final   Special Requests   Final    BOTTLES DRAWN AEROBIC AND ANAEROBIC Blood Culture adequate volume   Culture   Final    NO GROWTH 2 DAYS Performed at [facility], [address], [place], [place] [address]    Report Status PENDING  Incomplete    FURTHER DISCHARGE INSTRUCTIONS:   Get Medicines reviewed and adjusted: Please take all your medications with you for your next visit with your Primary MD   Laboratory/radiological data: Please request your Primary MD to go over all hospital tests and procedure/radiological results at the follow up, please ask your Primary MD to get all Hospital records sent to his/her office.   In some cases, they will be blood work, cultures and biopsy results  pending at the time of your discharge. Please request that your primary care M.D. goes through all the records of your hospital data and follows up on these results.   Also Note the following: If you experience worsening of your admission symptoms, develop shortness of breath, life threatening emergency, suicidal or homicidal thoughts you must seek medical attention immediately by calling 911 or calling your MD immediately  if symptoms less severe.   You must read complete instructions/literature along with all the possible adverse reactions/side effects for all the Medicines you take and that have been prescribed to you. Take any new Medicines after you have completely understood and accpet all the possible adverse reactions/side effects.    patient was instructed, not to drive, operate heavy machinery, perform activities at heights, swimming or participation in water activities or provide baby-sitting services while on Pain, Sleep and Anxiety Medications; until their outpatient Physician has advised to do so again. Also recommended to not to take more than prescribed Pain, Sleep and Anxiety Medications.  It is not advisable to combine anxiety, sleep and pain medications without talking with your primary care provider.     Wear Seat belts while driving.   Please note: You were cared for by a hospitalist during your hospital stay. Once you are discharged, your primary care physician will handle any further medical issues. Please note that NO REFILLS for any discharge medications will be authorized once you are discharged, as it is imperative that you return to your primary care physician (or establish a relationship with a primary care physician if you do not have one) for your post hospital discharge needs so that they can reassess your need for medications and monitor your lab values  Time coordinating  discharge: Over 30 minutes  SIGNED:   Fredia Skeeter, MD  [facility] 06/05/2024, 12:51  PM *Please note that this is a verbal dictation therefore any spelling or grammatical errors are due to the Dragon Medical One system interpretation. If 7PM-7AM, please contact night-coverage [email or web address]

## 2024-06-08 ENCOUNTER — Ambulatory Visit: Payer: Self-pay

## 2024-06-08 ENCOUNTER — Encounter (HOSPITAL_COMMUNITY): Payer: Self-pay

## 2024-06-08 ENCOUNTER — Encounter (HOSPITAL_COMMUNITY): Payer: Self-pay | Admitting: Podiatry

## 2024-06-08 LAB — CULTURE, BLOOD (ROUTINE X 2)
Culture: NO GROWTH
Culture: NO GROWTH
Special Requests: ADEQUATE
Special Requests: ADEQUATE

## 2024-06-08 LAB — SURGICAL PATHOLOGY

## 2024-06-08 LAB — ZINC: Zinc: 69 ug/dL (ref 44–115)

## 2024-06-10 ENCOUNTER — Ambulatory Visit (INDEPENDENT_AMBULATORY_CARE_PROVIDER_SITE_OTHER): Payer: Self-pay | Admitting: Podiatry

## 2024-06-10 ENCOUNTER — Encounter: Payer: Self-pay | Admitting: Podiatry

## 2024-06-10 DIAGNOSIS — Z9889 Other specified postprocedural states: Secondary | ICD-10-CM

## 2024-06-10 DIAGNOSIS — M869 Osteomyelitis, unspecified: Secondary | ICD-10-CM

## 2024-06-10 NOTE — Progress Notes (Signed)
  Subjective:  Patient ID: Mark Phillips, male    DOB: [birth year],  MRN: [id number]  Chief Complaint  Patient presents with   Wound Check    right partial second toe amputation. 2 pain. More numbness and burning. 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 Wearing surgical shoe.    DOS: 06/04/2024: Procedure: Amputation of right second toe through PIPJ  53 y.o. male seen for post op check.  Patient following up approximately 1 week after above procedure.  Walking postop shoe left dressing clean dry intact as instructed.  Review of Systems: Negative except as noted in the HPI. Denies N/V/F/Ch.   Objective:   Constitutional Well developed. Well nourished.  Vascular Foot warm and well perfused. Capillary refill normal to all digits.   No calf pain with palpation  Neurologic Normal speech. Oriented to person, place, and time. Epicritic sensation diminished to right foot  Dermatologic Amputation site right second toe healing well no dehiscence or erythema no evidence of infection   Orthopedic: Status post right second toe amputation at PIPJ level disarticulation   Radiographs:  Narrative & Impression  CLINICAL DATA:  Postoperative state.   EXAM: RIGHT FOOT - 2 VIEW   COMPARISON:  June 03, 2024.   FINDINGS: Status post interval surgical resection of the second middle and distal phalanges. Previous first and second toe amputations are again noted. No dislocation is noted.   IMPRESSION: Status post interval surgical resection of second middle and distal phalanges.    Pathology: A. RIGHT SECOND TOE, AMPUTATION:  Acute and chronic cellulitis with impetiginized dermatitis  Negative for osteomyelitis Skin and soft tissue margin grossly viable   Micro: None  Assessment:   1. 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 Post-operative state   Status post partial right second toe amputation PIPJ level  Plan:  Patient was evaluated and treated and all questions answered.  POD # 1  s/p right second toe partial amputation -Progressing as expected postoperatively, healing very well no dehiscence or residual infection -XR: Expected postoperative changes -WB Status: Weightbearing as tolerated in postop shoe -Sutures: Remain intact 1-2 more weeks. -Medications/ABX: Status post Augmentin  still has a few days left continue this until completed -Dressing: Replacing Xeroform gauze dressing leave intact until next appointment - F/u Plan: Follow-up in 1 week        Marolyn JULIANNA Honour, DPM [facility] / [facility]

## 2024-06-11 ENCOUNTER — Other Ambulatory Visit: Payer: Self-pay | Admitting: Podiatry

## 2024-06-17 ENCOUNTER — Ambulatory Visit (INDEPENDENT_AMBULATORY_CARE_PROVIDER_SITE_OTHER): Payer: MEDICAID | Admitting: Podiatry

## 2024-06-17 ENCOUNTER — Encounter: Payer: Self-pay | Admitting: Podiatry

## 2024-06-17 DIAGNOSIS — Z9889 Other specified postprocedural states: Secondary | ICD-10-CM

## 2024-06-17 DIAGNOSIS — M869 Osteomyelitis, unspecified: Secondary | ICD-10-CM

## 2024-06-17 NOTE — Progress Notes (Signed)
  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 male seen for post op check.  Patient following up approximately 2 week after above procedure.  He has been walking postop shoe states there is no pain drainage.  Coming in for suture removal.  Review of Systems: Negative except as noted in the HPI. Denies N/V/F/Ch.   Objective:   Constitutional Well developed. Well nourished.  Vascular Foot warm and well perfused. Capillary refill normal to all digits.   No calf pain with palpation  Neurologic Normal speech. Oriented to person, place, and time. Epicritic sensation diminished to right foot  Dermatologic Amputation site right second toe healing well no dehiscence or erythema no evidence of infection, improved healing from prior mild eschar at the amputation line   Orthopedic: Status post right second toe amputation at PIPJ level disarticulation   Radiographs:  Narrative & Impression  CLINICAL DATA:  Postoperative state.   EXAM: RIGHT FOOT - 2 VIEW   COMPARISON:  June 03, 2024.   FINDINGS: Status post interval surgical resection of the second middle and distal phalanges. Previous first and second toe amputations are again noted. No dislocation is noted.   IMPRESSION: Status post interval surgical resection of second middle and distal phalanges.    Pathology: A. RIGHT SECOND TOE, AMPUTATION:  Acute and chronic cellulitis with impetiginized dermatitis  Negative for osteomyelitis Skin and soft tissue margin grossly viable   Micro: None  Assessment:   1. 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 Post-operative state    Status post partial right second toe amputation PIPJ  level  Plan:  Patient was evaluated and treated and all questions answered.  2 weeks s/p right second toe partial amputation -Progressing as expected postoperatively, healing very well no dehiscence or residual infection, improved healing from prior -XR: Expected postoperative changes -WB Status: Weightbearing as tolerated in postop shoe -Sutures: Removed in total today -Medications/ABX: Antibiotics indicated -Dressing: Antibiotic ointment and Band-Aid style dressing changes as needed over the next 1 to 2 weeks.  Okay to wash the foot with warm soapy water dry after and apply new Band-Aid style dressing for protection - F/u Plan: Follow-up in 3 week for final recheck to ensure complete healing        Marolyn JULIANNA Honour, DPM [facility] / [facility]

## 2024-07-08 ENCOUNTER — Ambulatory Visit (INDEPENDENT_AMBULATORY_CARE_PROVIDER_SITE_OTHER): Payer: Self-pay | Admitting: Podiatry

## 2024-07-08 ENCOUNTER — Encounter: Payer: Self-pay | Admitting: Podiatry

## 2024-07-08 DIAGNOSIS — Z9889 Other specified postprocedural states: Secondary | ICD-10-CM

## 2024-07-08 DIAGNOSIS — M869 Osteomyelitis, unspecified: Secondary | ICD-10-CM

## 2024-07-08 NOTE — Progress Notes (Signed)
  Subjective:  Patient ID: Mark Phillips, male    DOB: 06-06-1971,  MRN: [id number]  Chief Complaint  Patient presents with   Wound Check    3 wk rtn Amputation of right second toe through PIPJ. 2 pain. NIDDM A1C 7.0    DOS: 06/04/2024: Procedure: Amputation of right second toe through PIPJ  53 y.o. male seen for post op check.  Patient following up approximately 5 weeks after above procedure.  Has been walking in regular shoe with regular sock on.  No dressing at this time.  Says it is not draining.  Has occasional throbbing pain but he believes this may be due to nerve pain.  Was noting some white tissue and stopped antibiotic ointment and Band-Aid due to excess moisture  Review of Systems: Negative except as noted in the HPI. Denies N/V/F/Ch.   Objective:   Constitutional Well developed. Well nourished.  Vascular Foot warm and well perfused. Capillary refill normal to all digits.   No calf pain with palpation  Neurologic Normal speech. Oriented to person, place, and time. Epicritic sensation diminished to right foot  Dermatologic Amputation site right second toe healing with very mild scabbing at the amputation Improved healing from prior with mild inflammation versus erythema at the distal toe mild edema    Orthopedic: Status post right second toe amputation at PIPJ level disarticulation   Radiographs:  Narrative & Impression  CLINICAL DATA:  Postoperative state.   EXAM: RIGHT FOOT - 2 VIEW   COMPARISON:  June 03, 2024.   FINDINGS: Status post interval surgical resection of the second middle and distal phalanges. Previous first and second toe amputations are again noted. No dislocation is noted.   IMPRESSION: Status post interval surgical resection of second middle and distal phalanges.    Pathology: A. RIGHT SECOND TOE, AMPUTATION:  Acute and chronic cellulitis with impetiginized dermatitis  Negative for osteomyelitis Skin and soft tissue margin  grossly viable   Micro: None  Assessment:   1. 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 Post-operative state     Status post partial right second toe amputation PIPJ level  Plan:  Patient was evaluated and treated and all questions answered.  5 weeks s/p right second toe partial amputation -Progressing as expected postoperatively, healing very well no dehiscence , still with small scab amputation site. -Will continue to monitor the amputation site given the very small amount of inflammation and residual scabbing want to see if fully healed prior to releasing the patient -XR: Expected postoperative changes -WB Status: Weightbearing as tolerated in postop shoe -Sutures: Previously removed -Medications/ABX: Antibiotics indicated -Dressing: Okay to continue washing the foot dry off after and monitor for worsening redness swelling or drainage does not need to apply Band-Aid if it is causing maceration at the amputation site - F/u Plan: Follow-up in 3 week for recheck to ensure complete healing        Mark Phillips, DPM [facility] / [facility]

## 2024-07-29 ENCOUNTER — Encounter: Payer: Self-pay | Admitting: Podiatry

## 2024-08-04 NOTE — Progress Notes (Deleted)
 Office Note     CC:  follow up Requesting Provider:  [facility] PUBLI*  HPI: Mark Phillips is a 53 y.o. ([birth year]) male who presents for surveillance follow up of PAD.    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   PONV (postoperative nausea and vomiting)    Sleep apnea    Type 2 diabetes mellitus (HCC)    diagnosed age 55    Past Surgical History:  Procedure Laterality Date   ABDOMINAL AORTOGRAM W/LOWER EXTREMITY N/A 12/09/2023   Procedure: ABDOMINAL AORTOGRAM W/LOWER EXTREMITY;  Surgeon: Serene Gaile ORN, MD;  Location: [facility] IN[facility]AS[facility];  Service: Cardiovascular;  Laterality: N/A;   AMPUTATION TOE Right 12/24/2023   Procedure: AMPUTATION, TOE;  Surgeon: Malvin Marsa FALCON, DPM;  Location: [facility] OR;  Service: Orthopedics/Podiatry;  Laterality: Right;  Right hallux and 5th toe amp   AMPUTATION TOE Right 06/04/2024   Procedure: AMPUTATION, TOE;  Surgeon: Malvin Marsa FALCON, DPM;  Location: [facility] OR;  Service: Orthopedics/Podiatry;  Laterality: Right;  RIGHT 2ND TOE AMPUTATION   ANKLE ARTHROSCOPY Right 2006   ANKLE RECONSTRUCTION Left    lt reconstruction and rt scope sx   APPENDECTOMY  2000   BIOPSY  09/19/2016   Procedure: BIOPSY;  Surgeon: Lamar CHRISTELLA Hollingshead, MD;  Location: [facility];  Service: Endoscopy;;  colon   COLON SURGERY  02/1999   removal of 8 inches due to perforation.   COLONOSCOPY WITH PROPOFOL  N/A 09/19/2016   Procedure: COLONOSCOPY WITH PROPOFOL ;  Surgeon: Lamar CHRISTELLA Hollingshead, MD;  Location: [facility];  Service: Endoscopy;  Laterality: N/A;  930    LUMBAR DISC SURGERY     July 2017 - Dr. Gaither   PERIPHERAL VASCULAR BALLOON ANGIOPLASTY  12/09/2023   Procedure: PERIPHERAL VASCULAR BALLOON ANGIOPLASTY;  Surgeon: Serene Gaile ORN, MD;  Location: [facility];  Service: Cardiovascular;;   RT SFA, RT Common femoral   SHOULDER ARTHROSCOPY WITH SUBACROMIAL DECOMPRESSION AND OPEN ROTATOR C Right 02/25/2013   Procedure: RIGHT SHOULDER ARTHROSCOPY WITH SUBACROMIAL DECOMPRESSION AND MINI OPEN ROTATOR CUFF REPAIR;  Surgeon: Reyes JAYSON Billing, MD;  Location: [facility] ORS;  Service: Orthopedics;  Laterality: Right;   WISDOM TOOTH EXTRACTION  2011   WISDOM TOOTH EXTRACTION Bilateral     Social History   Socioeconomic History   Marital status: Married    Spouse name: Not on file   Number of children: Not on file   Years of education: Not on file   Highest education level: Not on file  Occupational History   Not on file  Tobacco Use   Smoking status: Every Day    Current packs/day: 1.00    Average packs/day: 1 pack/day for 31.1 years (31.1 ttl pk-yrs)    Types: Cigarettes    Start date: 06/13/1993   Smokeless tobacco: Former    Types: Chew    Quit date: 10/07/1993  Vaping Use   Vaping status: Never Used  Substance and Sexual Activity   Alcohol use: No   Drug use: No   Sexual activity: Yes    Birth control/protection: None  Other Topics Concern   Not on file  Social History Narrative   Not on file   Social Drivers of Health   Financial Resource Strain: Not on file  Food Insecurity:  No Food Insecurity (06/04/2024)   Hunger Vital Sign    Worried About Running Out of Food in the Last Year: Never true    Ran Out of Food in the Last Year: Never true  Transportation Needs: No Transportation Needs (06/04/2024)   PRAPARE - Administrator, Civil Service (Medical): No    Lack of Transportation (Non-Medical): No  Physical Activity: Not on file  Stress: Not on file  Social Connections: Not on file  Intimate Partner Violence: Not At Risk (06/04/2024)   Humiliation, Afraid, Rape, and Kick questionnaire    Fear of Current or Ex-Partner: No    Emotionally Abused: No    Physically Abused: No    Sexually Abused: No   *** Family History  Problem Relation Age of Onset   Heart  attack Mother    Hypertension Mother    Diabetes Mellitus II Mother    COPD Father    COPD Maternal Grandmother    Colon cancer Maternal Grandmother    Asthma Sister     Current Outpatient Medications  Medication Sig Dispense Refill   acetaminophen  (TYLENOL ) 650 MG CR tablet Take 650 mg by mouth every 8 (eight) hours as needed for pain.     Aromatic Inhalants (VICKS VAPOR INHALER IN) Inhale 1 puff into the lungs 2 (two) times daily as needed (congestion). Nasal spray     aspirin  EC 81 MG tablet Take 1 tablet (81 mg total) by mouth daily. Swallow whole.     atorvastatin  (LIPITOR) 40 MG tablet Take 1 tablet (40 mg total) by mouth daily. 90 tablet 3   clopidogrel  (PLAVIX ) 75 MG tablet Take 1 tablet (75 mg total) by mouth daily with breakfast. 90 tablet 3   gabapentin  (NEURONTIN ) 300 MG capsule TAKE 1 CAPSULE BY MOUTH THREE TIMES A DAY 90 capsule 3   metFORMIN  (GLUCOPHAGE ) 1000 MG tablet TAKE 1 Tablet  BY MOUTH TWICE DAILY WITH A MEAL (Patient taking differently: Take 500 mg by mouth 2 (two) times daily.) 180 tablet 1   oxyCODONE  (OXY IR/ROXICODONE ) 5 MG immediate release tablet Take 1 tablet (5 mg total) by mouth every 4 (four) hours as needed for moderate pain (pain score 4-6). (Patient not taking: Reported on 07/08/2024) 15 tablet 0   No current facility-administered medications for this visit.    Allergies  Allergen Reactions   Ventolin  [Albuterol ] Swelling    Immediate lip swelling   Vancomycin  Other (See Comments)    Vancomycin  infusion reaction with moderate symptoms     REVIEW OF SYSTEMS:  Negative unless noted in HPI [X]  denotes positive finding, [ ]  denotes negative finding Cardiac  Comments:  Chest pain or chest pressure:    Shortness of breath upon exertion:    Short of breath when lying flat:    Irregular heart rhythm:        Vascular    Pain in calf, thigh, or hip brought on by ambulation:    Pain in feet at night that wakes you up from your sleep:     Blood clot  in your veins:    Leg swelling:         Pulmonary    Oxygen at home:    Productive cough:     Wheezing:         Neurologic    Sudden weakness in arms or legs:     Sudden numbness in arms or legs:     Sudden onset of difficulty speaking or slurred speech:  Temporary loss of vision in one eye:     Problems with dizziness:         Gastrointestinal    Blood in stool:     Vomited blood:         Genitourinary    Burning when urinating:     Blood in urine:        Psychiatric    Major depression:         Hematologic    Bleeding problems:    Problems with blood clotting too easily:        Skin    Rashes or ulcers:        Constitutional    Fever or chills:      PHYSICAL EXAMINATION:  There were no vitals filed for this visit.***  General:  WDWN in NAD; vital signs documented above Gait: Not observed HENT: WNL, normocephalic Pulmonary: normal non-labored breathing Cardiac: {Desc; regular/irreg:14544} HR Abdomen: soft, NT, no masses Skin: {With/Without:20273} rashes Vascular Exam/Pulses: *** Extremities: {With/Without:20273} ischemic changes, {With/Without:20273} Gangrene , {With/Without:20273} cellulitis; {With/Without:20273} open wounds;  Musculoskeletal: no muscle wasting or atrophy  Neurologic: A&O X 3*** Psychiatric:  The pt has {Desc; normal/abnormal:11317::Normal} affect.   Non-Invasive Vascular Imaging:   ***    ASSESSMENT/PLAN:: 53 y.o. male here for follow up for ***   -***   Teretha Damme, PA-C [facility] [phone]  Clinic MD:   [place]

## 2024-08-05 ENCOUNTER — Ambulatory Visit: Payer: MEDICAID

## 2024-08-05 ENCOUNTER — Ambulatory Visit (HOSPITAL_COMMUNITY): Payer: MEDICAID | Attending: Physician Assistant

## 2024-08-05 ENCOUNTER — Ambulatory Visit (HOSPITAL_COMMUNITY): Payer: MEDICAID

## 2024-08-19 ENCOUNTER — Ambulatory Visit: Payer: Self-pay | Admitting: Podiatry

## 2024-08-19 DIAGNOSIS — Z9889 Other specified postprocedural states: Secondary | ICD-10-CM

## 2024-08-19 DIAGNOSIS — M869 Osteomyelitis, unspecified: Secondary | ICD-10-CM

## 2024-08-19 DIAGNOSIS — T8131XA Disruption of external operation (surgical) wound, not elsewhere classified, initial encounter: Secondary | ICD-10-CM

## 2024-08-19 MED ORDER — DOXYCYCLINE HYCLATE 100 MG PO TABS: 100.0000 mg | ORAL_TABLET | Freq: Two times a day (BID) | ORAL | 0 refills | Status: AC

## 2024-08-19 NOTE — Progress Notes (Signed)
  Subjective:  Patient ID: Mark Phillips, male    DOB: 1971/07/19,  MRN: [id number]  Chief Complaint  Patient presents with   Routine Post Op    Amputation of right second toe through PIPJ. 2 pain. 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 Maceration and cleaning with alcohol daily. Wearing surgical shoe.    DOS: 06/04/2024: Procedure: Amputation of right second toe through PIPJ  53 y.o. male seen for post op check.   Patient reports that the amputation site is opened up and is slightly changed in color from prior.  Has been cleaning with alcohol swab and putting Band-Aid on daily.  Wearing surgical shoe  Review of Systems: Negative except as noted in the HPI. Denies N/V/F/Ch.   Objective:   Constitutional Well developed. Well nourished.  Vascular Foot warm and well perfused. Capillary refill normal to all digits.   No calf pain with palpation  Neurologic Normal speech. Oriented to person, place, and time. Epicritic sensation diminished to right foot  Dermatologic Amputation site right second toe wound dehiscence and exposed proximal phalanx head     Orthopedic: Status post right second toe amputation at PIPJ level disarticulation   Radiographs:  Narrative & Impression  CLINICAL DATA:  Postoperative state.   EXAM: RIGHT FOOT - 2 VIEW   COMPARISON:  June 03, 2024.   FINDINGS: Status post interval surgical resection of the second middle and distal phalanges. Previous first and second toe amputations are again noted. No dislocation is noted.   IMPRESSION: Status post interval surgical resection of second middle and distal phalanges.    Pathology: A. RIGHT SECOND TOE, AMPUTATION:  Acute and chronic cellulitis with impetiginized dermatitis  Negative for osteomyelitis Skin and soft tissue margin grossly viable   Micro: None  Assessment:   1. Postoperative wound dehiscence, initial encounter   2. 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  10 weeks s/p right second toe partial amputation -Unfortunately has had a late dehiscence of the surgical site with exposed proximal phalanx head -At this time due to the exposed bone I recommend revision amputation back to the MPJ level.  Discussed risk benefits alternatives and possible complications.  He wishes to proceed.  Will begin surgical planning to set him up for outpatient revision amputation of the second toe on the right foot to the MPJ level  -XR: Expected postoperative changes -WB Status: Weightbearing as tolerated in postop shoe -Sutures: Previously removed -Medications/ABX: Recommend 10 days doxycycline  -Dressing: Recommend Betadine and Band-Aid dressing until surgery - F/u Plan: Follow-up after OR        Marolyn JULIANNA Honour, DPM [facility] / [facility]

## 2024-10-07 DEATH — deceased
